# Patient Record
Sex: Female | Born: 1980 | Race: Black or African American | Hispanic: No | Marital: Single | Smoking: Never smoker
Health system: Southern US, Community
[De-identification: ages and names within clinical notes are randomized; demographics above are authoritative.]

## PROBLEM LIST (undated history)

## (undated) ENCOUNTER — Inpatient Hospital Stay (HOSPITAL_COMMUNITY): Payer: Self-pay

## (undated) DIAGNOSIS — I319 Disease of pericardium, unspecified: Secondary | ICD-10-CM

## (undated) DIAGNOSIS — D34 Benign neoplasm of thyroid gland: Secondary | ICD-10-CM

## (undated) DIAGNOSIS — D219 Benign neoplasm of connective and other soft tissue, unspecified: Secondary | ICD-10-CM

---

## 2008-01-09 ENCOUNTER — Ambulatory Visit: Payer: Self-pay | Admitting: Obstetrics & Gynecology

## 2008-01-09 ENCOUNTER — Inpatient Hospital Stay (HOSPITAL_COMMUNITY): Admission: AD | Admit: 2008-01-09 | Discharge: 2008-01-10 | Payer: Self-pay | Admitting: Obstetrics & Gynecology

## 2009-02-12 DIAGNOSIS — I319 Disease of pericardium, unspecified: Secondary | ICD-10-CM

## 2009-02-12 HISTORY — DX: Disease of pericardium, unspecified: I31.9

## 2011-10-07 ENCOUNTER — Emergency Department (HOSPITAL_COMMUNITY): Payer: Self-pay

## 2011-10-07 ENCOUNTER — Emergency Department (HOSPITAL_COMMUNITY)
Admission: EM | Admit: 2011-10-07 | Discharge: 2011-10-07 | Disposition: A | Discharge status: Home / Self Care | Payer: Self-pay | Attending: Emergency Medicine | Admitting: Emergency Medicine

## 2011-10-07 ENCOUNTER — Encounter (HOSPITAL_COMMUNITY): Payer: Self-pay | Admitting: *Deleted

## 2011-10-07 DIAGNOSIS — R0789 Other chest pain: Secondary | ICD-10-CM

## 2011-10-07 DIAGNOSIS — R0602 Shortness of breath: Secondary | ICD-10-CM | POA: Insufficient documentation

## 2011-10-07 DIAGNOSIS — R071 Chest pain on breathing: Secondary | ICD-10-CM | POA: Insufficient documentation

## 2011-10-07 HISTORY — DX: Disease of pericardium, unspecified: I31.9

## 2011-10-07 LAB — BASIC METABOLIC PANEL
Chloride: 100 mEq/L (ref 96–112)
GFR calc Af Amer: >90 mL/min (ref >90)
GFR calc non Af Amer: >90 mL/min (ref >90)
Glucose, Bld: 85 mg/dL (ref 70–99)
Potassium: 4.2 mEq/L (ref 3.5–5.1)
Sodium: 136 mEq/L (ref 135–145)

## 2011-10-07 MED ORDER — KETOROLAC TROMETHAMINE 60 MG/2ML IM SOLN
60.0000 mg | Freq: Once | INTRAMUSCULAR | Status: AC
  Administered 2011-10-07: 60 mg via INTRAMUSCULAR
  Filled 2011-10-07: qty 2

## 2011-10-07 MED ORDER — IBUPROFEN 800 MG PO TABS: 800.0000 mg | ORAL_TABLET | Freq: Three times a day (TID) | ORAL | Status: AC

## 2011-10-07 NOTE — ED Notes (Signed)
To ED for eval of cp and sob. Started yesterday. Denies injury. Pt states even her bra on makes her hurt. Described as a 'sharp pressure'. States she was dx with pericarditis in 2011 while in [place]. States this feels the same 'but worse because coughing or blowing my nose makes it hurt worse'. Pain is located on left side of chest and feels pain down both arms.

## 2011-10-07 NOTE — ED Notes (Signed)
Pt refuses to get back in chair and demanded to stay on the floor

## 2011-10-07 NOTE — ED Notes (Signed)
Pt is on the floor, in tears due to pain. Pt advised to sit in chair

## 2011-10-07 NOTE — ED Notes (Addendum)
Assisted pt in taking off bra due to pain. Pt states she has been told (in [place]) that she has a knot in her chest. States she feels the knot is bigger. When looking at chest left side appears swollen without crepitus noted

## 2011-10-07 NOTE — ED Provider Notes (Signed)
History     CSN: [id number]  Arrival date & time 10/07/11  1222   First MD Initiated Contact with Patient 10/07/11 1323      Chief Complaint  Patient presents with  . Chest Pain  . Shortness of Breath    (Consider location/radiation/quality/duration/timing/severity/associated sxs/prior treatment) Patient is a 31 y.o. female presenting with chest pain and shortness of breath. The history is provided by the patient.  Chest Pain The chest pain began yesterday. Chest pain occurs constantly. The chest pain is unchanged. Chest pain is worsened by certain positions and deep breathing. Primary symptoms include shortness of breath. Pertinent negatives for primary symptoms include no fever, no abdominal pain, no nausea and no vomiting. Associated symptoms comments: She complains of chest pain on left since yesterday that is constant, worse with movement deep breath or cough. No N, V. She reports history of pericarditis and this feels the same. No fever. .    Shortness of Breath  Associated symptoms include chest pain and shortness of breath. Pertinent negatives include no fever.    Past Medical History  Diagnosis Date  . Pericarditis     History reviewed. No pertinent past surgical history.  No family history on file.  History  Substance Use Topics  . Smoking status: Never Smoker   . Smokeless tobacco: Not on file  . Alcohol Use: No    OB History    Grav Para Term Preterm Abortions TAB SAB Ect Mult Living                  Review of Systems  Constitutional: Negative for fever.  HENT: Negative for congestion.   Respiratory: Positive for shortness of breath.   Cardiovascular: Positive for chest pain. Negative for leg swelling.  Gastrointestinal: Negative for nausea, vomiting and abdominal pain.  Musculoskeletal: Negative for back pain.    Allergies  Penicillins  Home Medications  No current outpatient prescriptions on file.  BP 115/72  Pulse 63  Temp 98.5 F (36.9  C) (Oral)  Resp 14  SpO2 100%  LMP 10/05/2011  Physical Exam  Constitutional: She is oriented to person, place, and time. She appears well-developed and well-nourished.  HENT:  Head: Normocephalic.  Mouth/Throat: Oropharynx is clear and moist.  Neck: Normal range of motion. Neck supple.  Cardiovascular: Normal rate and regular rhythm.   Pulmonary/Chest: Effort normal and breath sounds normal. She has no wheezes. She has no rales. She exhibits tenderness.       Left chest wall tenderness.   Abdominal: Soft. Bowel sounds are normal. There is no tenderness. There is no rebound and no guarding.  Musculoskeletal: Normal range of motion. She exhibits no edema.  Neurological: She is alert and oriented to person, place, and time.  Skin: Skin is warm and dry. No rash noted.  Psychiatric: She has a normal mood and affect.    ED Course  Procedures (including critical care time)   Labs Reviewed  BASIC METABOLIC PANEL   Dg Chest 2 View  10/07/2011  *RADIOLOGY REPORT*  Clinical Data: Shortness of breath and chest pain.  CHEST - 2 VIEW  Comparison: None  Findings: The cardiac silhouette, mediastinal and hilar contours are within normal limits.  The lungs are clear.  No pleural effusion.  The bony thorax is intact.  IMPRESSION: No acute cardiopulmonary findings.   Original Report Authenticated By: P. Loralie Champagne, M.D.      No diagnosis found.  1. Chest Wall pain   MDM  Date: 10/07/2011  Rate: 51  Rhythm: normal sinus rhythm  QRS Axis: normal  Intervals: normal  ST/T Wave abnormalities: normal  Conduction Disutrbances:none  Narrative Interpretation:   Old EKG Reviewed: none available  Labs, EKG and CXR negative. Patient is ambulating to and from bathroom without difficulty or apparent discomfort. No tachycardia or hypoxia to suggest PE. Afebrile, does not favor infection. Chest pain is reproducible. Presentation supports uncomplicated chest wall pain.  VSS.          Rodena Medin, PA-C 10/07/11 1523

## 2011-10-08 NOTE — ED Provider Notes (Signed)
Medical screening examination/treatment/procedure(s) were conducted as a shared visit with non-physician practitioner(s) and myself.  I personally evaluated the patient during the encounter Pt with ant chest wall pain, marked tenderness on exam reproducing symptoms. Cxr.   Suzi Roots, MD 10/08/11 [phone]

## 2012-01-12 ENCOUNTER — Inpatient Hospital Stay (HOSPITAL_COMMUNITY)
Admission: AD | Admit: 2012-01-12 | Discharge: 2012-01-12 | Disposition: A | Discharge status: Home / Self Care | Payer: Medicaid Other | Source: Ambulatory Visit | Attending: Obstetrics and Gynecology | Admitting: Obstetrics and Gynecology

## 2012-01-12 ENCOUNTER — Encounter (HOSPITAL_COMMUNITY): Payer: Self-pay | Admitting: *Deleted

## 2012-01-12 DIAGNOSIS — K5900 Constipation, unspecified: Secondary | ICD-10-CM | POA: Insufficient documentation

## 2012-01-12 DIAGNOSIS — N949 Unspecified condition associated with female genital organs and menstrual cycle: Secondary | ICD-10-CM

## 2012-01-12 DIAGNOSIS — R109 Unspecified abdominal pain: Secondary | ICD-10-CM | POA: Insufficient documentation

## 2012-01-12 DIAGNOSIS — O99891 Other specified diseases and conditions complicating pregnancy: Secondary | ICD-10-CM | POA: Insufficient documentation

## 2012-01-12 DIAGNOSIS — O9989 Other specified diseases and conditions complicating pregnancy, childbirth and the puerperium: Secondary | ICD-10-CM

## 2012-01-12 DIAGNOSIS — R102 Pelvic and perineal pain: Secondary | ICD-10-CM

## 2012-01-12 LAB — URINALYSIS, ROUTINE W REFLEX MICROSCOPIC
Nitrite: NEGATIVE
Specific Gravity, Urine: 1.025 (ref 1.005–1.030)
Urobilinogen, UA: 0.2 mg/dL (ref 0.0–1.0)
pH: 6 (ref 5.0–8.0)

## 2012-01-12 LAB — POCT PREGNANCY, URINE: Preg Test, Ur: POSITIVE — AB

## 2012-01-12 MED ORDER — DOCUSATE SODIUM 100 MG PO CAPS: 100.0000 mg | ORAL_CAPSULE | Freq: Two times a day (BID) | ORAL | Status: DC

## 2012-01-12 NOTE — MAU Note (Signed)
Pt was seen at [facility] in the ER about 6 weeks ago. Pt signed the release of medical form and it was faxed to [facility] at [facility] ER. [facility].

## 2012-01-12 NOTE — MAU Provider Note (Signed)
History     CSN: [id number]  Arrival date and time: 01/12/12 1324   First Provider Initiated Contact with Patient 01/12/12 1423      Chief Complaint  Patient presents with  . Abdominal Pain   HPI  Jennifer Dunlap is a 31 y.o. [id number] at [redacted]w[redacted]d who presents today with bilateral lower abdominal pain that is sharp. It is in the same place as round ligament pain, but feels much stronger than what she has experienced with her other pregnancies. It started about 2 weeks ago, and is described as sharp. She has not found any alleviating or aggravating factors. 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  She has also started getting headaches within the last week, and her right has been twitching . She has 5 kids, and works.   Past Medical History  Diagnosis Date  . Pericarditis     Past Surgical History  Procedure Date  . No past surgeries     History reviewed. No pertinent family history.  History  Substance Use Topics  . Smoking status: Never Smoker   . Smokeless tobacco: Not on file  . Alcohol Use: No    Allergies:  Allergies  Allergen Reactions  . Penicillins Shortness Of Breath    No prescriptions prior to admission    Review of Systems  Constitutional: Negative for fever and chills.  Gastrointestinal: Positive for abdominal pain and constipation (taking pre-natal vitamin. Last BM was today, but small, hard, formed ballls. She has tried increasing fiber and water. ). Negative for nausea, vomiting and diarrhea.  Neurological: Positive for headaches.   Physical Exam   Blood pressure 120/66, pulse 66, temperature 97.6 F (36.4 C), temperature source Oral, resp. rate 18, height 5\' 3"  (1.6 m), weight 161 lb 12.8 oz (73.392 kg), last menstrual period 10/07/2011.  Physical Exam  Nursing note and vitals reviewed. Constitutional: She is oriented to person, place, and time. She appears well-developed and well-nourished.    Cardiovascular: Normal rate.   Respiratory: Effort normal.  GI: Soft. She exhibits no distension. There is no tenderness. There is no rebound and no guarding.  Genitourinary:        External: normal Vagina: normal, small amount of white discharge Cervix: pink, smooth. Closed Uterus: AGA Adnexa: NT Tenderness along the round ligaments.   Musculoskeletal: Normal range of motion.  Neurological: She is alert and oriented to person, place, and time.  Skin: Skin is warm and dry.    MAU Course  Procedures  Results for orders placed during the hospital encounter of 01/12/12 (from the past 24 hour(s))  URINALYSIS, ROUTINE W REFLEX MICROSCOPIC     Status: Abnormal   Collection Time   01/12/12  1:30 PM      Component Value Range   Color, Urine YELLOW  YELLOW   APPearance HAZY (*) CLEAR   Specific Gravity, Urine 1.025  1.005 - 1.030   pH 6.0  5.0 - 8.0   Glucose, UA NEGATIVE  NEGATIVE mg/dL   Hgb urine dipstick NEGATIVE  NEGATIVE   Bilirubin Urine NEGATIVE  NEGATIVE   Ketones, ur NEGATIVE  NEGATIVE mg/dL   Protein, ur NEGATIVE  NEGATIVE mg/dL   Urobilinogen, UA 0.2  0.0 - 1.0 mg/dL   Nitrite NEGATIVE  NEGATIVE   Leukocytes, UA NEGATIVE  NEGATIVE  POCT PREGNANCY, URINE     Status: Abnormal   Collection Time   01/12/12  2:04 PM  Component Value Range   Preg Test, Ur POSITIVE (*) NEGATIVE  WET PREP, GENITAL     Status: Abnormal   Collection Time   01/12/12  2:37 PM      Component Value Range   Yeast Wet Prep HPF POC NONE SEEN  NONE SEEN   Trich, Wet Prep NONE SEEN  NONE SEEN   Clue Cells Wet Prep HPF POC NONE SEEN  NONE SEEN   WBC, Wet Prep HPF POC FEW (*) NONE SEEN    Assessment and Plan  Round ligament pain Constipation  Colace 100mg  BID Comfort measures reviewed 2nd trimester danger signs reviewed Start prenatal care ASAP.  Tawnya Crook 01/12/2012, 2:23 PM

## 2012-01-12 NOTE — MAU Note (Signed)
Pt reports having a sharp pain that comes and goes for a few weeks, Not started prenatal care yet.

## 2012-01-13 LAB — GC/CHLAMYDIA PROBE AMP
CT Probe RNA: NEGATIVE
GC Probe RNA: NEGATIVE

## 2012-01-14 NOTE — MAU Provider Note (Signed)
Attestation of Attending Supervision of Advanced Practitioner (CNM/NP): Evaluation and management procedures were performed by the Advanced Practitioner under my supervision and collaboration.  I have reviewed the Advanced Practitioner's note and chart, and I agree with the management and plan.  Jonty Morrical 01/14/2012 12:35 PM   

## 2012-02-13 NOTE — L&D Delivery Note (Signed)
Delivery Note At 8:14 AM a viable female was delivered via Vaginal, Spontaneous Delivery (Presentation: Left  Anterior).  APGAR: 7, 9; weight 8 lb 1.1 oz (3660 g).   Placenta status: Intact, Spontaneous.  Cord: 3 vessels with the following complications: None.    Anesthesia: Epidural  Episiotomy: None Lacerations: None Suture Repair: n/a Est. Blood Loss (mL): 250  Mom to postpartum.  Baby to nursery-stable.  POE,DEIRDRE 07/15/2012, 10:31 AM   Note done for Dr. Thad Ranger who attended delivery with Dr. Jerelyn Scott.

## 2012-02-13 NOTE — L&D Delivery Note (Signed)
Agree with above note.  Jennifer Dunlap H. 07/23/2012 4:10 PM

## 2012-02-20 ENCOUNTER — Other Ambulatory Visit (HOSPITAL_COMMUNITY)
Admission: RE | Admit: 2012-02-20 | Discharge: 2012-02-20 | Disposition: A | Discharge status: Home / Self Care | Payer: Medicaid Other | Source: Ambulatory Visit | Attending: Obstetrics and Gynecology | Admitting: Obstetrics and Gynecology

## 2012-02-20 ENCOUNTER — Ambulatory Visit (INDEPENDENT_AMBULATORY_CARE_PROVIDER_SITE_OTHER): Payer: Self-pay | Admitting: Family Medicine

## 2012-02-20 ENCOUNTER — Ambulatory Visit (HOSPITAL_COMMUNITY)
Admission: RE | Admit: 2012-02-20 | Discharge: 2012-02-20 | Disposition: A | Discharge status: Home / Self Care | Payer: Medicaid Other | Source: Ambulatory Visit | Attending: Family Medicine | Admitting: Family Medicine

## 2012-02-20 ENCOUNTER — Encounter: Payer: Self-pay | Admitting: Family Medicine

## 2012-02-20 VITALS — BP 111/63 | Temp 97.4°F | Wt 164.0 lb

## 2012-02-20 DIAGNOSIS — Z1151 Encounter for screening for human papillomavirus (HPV): Secondary | ICD-10-CM | POA: Insufficient documentation

## 2012-02-20 DIAGNOSIS — O0930 Supervision of pregnancy with insufficient antenatal care, unspecified trimester: Secondary | ICD-10-CM

## 2012-02-20 DIAGNOSIS — Z8759 Personal history of other complications of pregnancy, childbirth and the puerperium: Secondary | ICD-10-CM

## 2012-02-20 DIAGNOSIS — Z8679 Personal history of other diseases of the circulatory system: Secondary | ICD-10-CM

## 2012-02-20 DIAGNOSIS — O0940 Supervision of pregnancy with grand multiparity, unspecified trimester: Secondary | ICD-10-CM | POA: Insufficient documentation

## 2012-02-20 DIAGNOSIS — Z3689 Encounter for other specified antenatal screening: Secondary | ICD-10-CM | POA: Insufficient documentation

## 2012-02-20 DIAGNOSIS — Z01419 Encounter for gynecological examination (general) (routine) without abnormal findings: Secondary | ICD-10-CM | POA: Insufficient documentation

## 2012-02-20 LAB — POCT URINALYSIS DIP (DEVICE)
Bilirubin Urine: NEGATIVE
Ketones, ur: NEGATIVE mg/dL
Leukocytes, UA: NEGATIVE
Nitrite: NEGATIVE
Protein, ur: NEGATIVE mg/dL
Specific Gravity, Urine: 1.015 (ref 1.005–1.030)
Urobilinogen, UA: 0.2 mg/dL (ref 0.0–1.0)

## 2012-02-20 NOTE — Progress Notes (Signed)
Pulse: 72

## 2012-02-20 NOTE — Patient Instructions (Addendum)
Pregnancy - Second Trimester The second trimester of pregnancy (3 to 6 months) is a period of rapid growth for you and your baby. At the end of the sixth month, your baby is about 9 inches long and weighs 1 1/2 pounds. You will begin to feel the baby move between 18 and 20 weeks of the pregnancy. This is called quickening. Weight gain is faster. A clear fluid (colostrum) may leak out of your breasts. You may feel small contractions of the womb (uterus). This is known as false labor or Braxton-Hicks contractions. This is like a practice for labor when the baby is ready to be born. Usually, the problems with morning sickness have usually passed by the end of your first trimester. Some women develop small dark blotches (called cholasma, mask of pregnancy) on their face that usually goes away after the baby is born. Exposure to the sun makes the blotches worse. Acne may also develop in some pregnant women and pregnant women who have acne, may find that it goes away. PRENATAL EXAMS  Blood work may continue to be done during prenatal exams. These tests are done to check on your health and the probable health of your baby. Blood work is used to follow your blood levels (hemoglobin). Anemia (low hemoglobin) is common during pregnancy. Iron and vitamins are given to help prevent this. You will also be checked for diabetes between 24 and 28 weeks of the pregnancy. Some of the previous blood tests may be repeated.  The size of the uterus is measured during each visit. This is to make sure that the baby is continuing to grow properly according to the dates of the pregnancy.  Your blood pressure is checked every prenatal visit. This is to make sure you are not getting toxemia.  Your urine is checked to make sure you do not have an infection, diabetes or protein in the urine.  Your weight is checked often to make sure gains are happening at the suggested rate. This is to ensure that both you and your baby are growing  normally.  Sometimes, an ultrasound is performed to confirm the proper growth and development of the baby. This is a test which bounces harmless sound waves off the baby so your caregiver can more accurately determine due dates. Sometimes, a specialized test is done on the amniotic fluid surrounding the baby. This test is called an amniocentesis. The amniotic fluid is obtained by sticking a needle into the belly (abdomen). This is done to check the chromosomes in instances where there is a concern about possible genetic problems with the baby. It is also sometimes done near the end of pregnancy if an early delivery is required. In this case, it is done to help make sure the baby's lungs are mature enough for the baby to live outside of the womb. CHANGES OCCURING IN THE SECOND TRIMESTER OF PREGNANCY Your body goes through many changes during pregnancy. They vary from person to person. Talk to your caregiver about changes you notice that you are concerned about.  During the second trimester, you will likely have an increase in your appetite. It is normal to have cravings for certain foods. This varies from person to person and pregnancy to pregnancy.  Your lower abdomen will begin to bulge.  You may have to urinate more often because the uterus and baby are pressing on your bladder. It is also common to get more bladder infections during pregnancy (pain with urination). You can help this by   drinking lots of fluids and emptying your bladder before and after intercourse.  You may begin to get stretch marks on your hips, abdomen, and breasts. These are normal changes in the body during pregnancy. There are no exercises or medications to take that prevent this change.  You may begin to develop swollen and bulging veins (varicose veins) in your legs. Wearing support hose, elevating your feet for 15 minutes, 3 to 4 times a day and limiting salt in your diet helps lessen the problem.  Heartburn may develop  as the uterus grows and pushes up against the stomach. Antacids recommended by your caregiver helps with this problem. Also, eating smaller meals 4 to 5 times a day helps.  Constipation can be treated with a stool softener or adding bulk to your diet. Drinking lots of fluids, vegetables, fruits, and whole grains are helpful.  Exercising is also helpful. If you have been very active up until your pregnancy, most of these activities can be continued during your pregnancy. If you have been less active, it is helpful to start an exercise program such as walking.  Hemorrhoids (varicose veins in the rectum) may develop at the end of the second trimester. Warm sitz baths and hemorrhoid cream recommended by your caregiver helps hemorrhoid problems.  Backaches may develop during this time of your pregnancy. Avoid heavy lifting, wear low heal shoes and practice good posture to help with backache problems.  Some pregnant women develop tingling and numbness of their hand and fingers because of swelling and tightening of ligaments in the wrist (carpel tunnel syndrome). This goes away after the baby is born.  As your breasts enlarge, you may have to get a bigger bra. Get a comfortable, cotton, support bra. Do not get a nursing bra until the last month of the pregnancy if you will be nursing the baby.  You may get a dark line from your belly button to the pubic area called the linea nigra.  You may develop rosy cheeks because of increase blood flow to the face.  You may develop spider looking lines of the face, neck, arms and chest. These go away after the baby is born. HOME CARE INSTRUCTIONS   It is extremely important to avoid all smoking, herbs, alcohol, and unprescribed drugs during your pregnancy. These chemicals affect the formation and growth of the baby. Avoid these chemicals throughout the pregnancy to ensure the delivery of a healthy infant.  Most of your home care instructions are the same as  suggested for the first trimester of your pregnancy. Keep your caregiver's appointments. Follow your caregiver's instructions regarding medication use, exercise and diet.  During pregnancy, you are providing food for you and your baby. Continue to eat regular, well-balanced meals. Choose foods such as meat, fish, milk and other low fat dairy products, vegetables, fruits, and whole-grain breads and cereals. Your caregiver will tell you of the ideal weight gain.  A physical sexual relationship may be continued up until near the end of pregnancy if there are no other problems. Problems could include early (premature) leaking of amniotic fluid from the membranes, vaginal bleeding, abdominal pain, or other medical or pregnancy problems.  Exercise regularly if there are no restrictions. Check with your caregiver if you are unsure of the safety of some of your exercises. The greatest weight gain will occur in the last 2 trimesters of pregnancy. Exercise will help you:  Control your weight.  Get you in shape for labor and delivery.  Lose weight   after you have the baby.  Wear a good support or jogging bra for breast tenderness during pregnancy. This may help if worn during sleep. Pads or tissues may be used in the bra if you are leaking colostrum.  Do not use hot tubs, steam rooms or saunas throughout the pregnancy.  Wear your seat belt at all times when driving. This protects you and your baby if you are in an accident.  Avoid raw meat, uncooked cheese, cat litter boxes and soil used by cats. These carry germs that can cause birth defects in the baby.  The second trimester is also a good time to visit your dentist for your dental health if this has not been done yet. Getting your teeth cleaned is OK. Use a soft toothbrush. Brush gently during pregnancy.  It is easier to loose urine during pregnancy. Tightening up and strengthening the pelvic muscles will help with this problem. Practice stopping your  urination while you are going to the bathroom. These are the same muscles you need to strengthen. It is also the muscles you would use as if you were trying to stop from passing gas. You can practice tightening these muscles up 10 times a set and repeating this about 3 times per day. Once you know what muscles to tighten up, do not perform these exercises during urination. It is more likely to contribute to an infection by backing up the urine.  Ask for help if you have financial, counseling or nutritional needs during pregnancy. Your caregiver will be able to offer counseling for these needs as well as refer you for other special needs.  Your skin may become oily. If so, wash your face with mild soap, use non-greasy moisturizer and oil or cream based makeup. MEDICATIONS AND DRUG USE IN PREGNANCY  Take prenatal vitamins as directed. The vitamin should contain 1 milligram of folic acid. Keep all vitamins out of reach of children. Only a couple vitamins or tablets containing iron may be fatal to a baby or young child when ingested.  Avoid use of all medications, including herbs, over-the-counter medications, not prescribed or suggested by your caregiver. Only take over-the-counter or prescription medicines for pain, discomfort, or fever as directed by your caregiver. Do not use aspirin.  Let your caregiver also know about herbs you may be using.  Alcohol is related to a number of birth defects. This includes fetal alcohol syndrome. All alcohol, in any form, should be avoided completely. Smoking will cause low birth rate and premature babies.  Street or illegal drugs are very harmful to the baby. They are absolutely forbidden. A baby born to an addicted mother will be addicted at birth. The baby will go through the same withdrawal an adult does. SEEK MEDICAL CARE IF:  You have any concerns or worries during your pregnancy. It is better to call with your questions if you feel they cannot wait, rather  than worry about them. SEEK IMMEDIATE MEDICAL CARE IF:   An unexplained oral temperature above 102 F (38.9 C) develops, or as your caregiver suggests.  You have leaking of fluid from the vagina (birth canal). If leaking membranes are suspected, take your temperature and tell your caregiver of this when you call.  There is vaginal spotting, bleeding, or passing clots. Tell your caregiver of the amount and how many pads are used. Light spotting in pregnancy is common, especially following intercourse.  You develop a bad smelling vaginal discharge with a change in the color from clear   to white.  You continue to feel sick to your stomach (nauseated) and have no relief from remedies suggested. You vomit blood or coffee ground-like materials.  You lose more than 2 pounds of weight or gain more than 2 pounds of weight over 1 week, or as suggested by your caregiver.  You notice swelling of your face, hands, feet, or legs.  You get exposed to German measles and have never had them.  You are exposed to fifth disease or chickenpox.  You develop belly (abdominal) pain. Round ligament discomfort is a common non-cancerous (benign) cause of abdominal pain in pregnancy. Your caregiver still must evaluate you.  You develop a bad headache that does not go away.  You develop fever, diarrhea, pain with urination, or shortness of breath.  You develop visual problems, blurry, or double vision.  You fall or are in a car accident or any kind of trauma.  There is mental or physical violence at home. Document Released: 01/23/2001 Document Revised: 04/23/2011 Document Reviewed: 07/28/2008 [employer] Patient Information 2013 [employer]  AFP Maternal This is a routine screen (tests) used to check for fetal abnormalities such as Down syndrome and neural tube defects. Down Syndrome is a chromosomal abnormality, sometimes called Trisomy 21. Neural tube defects are serious birth defects. The brain, spinal  cord, or their coverings do not develop completely. Women should be tested in the 15th to 20th week of pregnancy. The msAFP screen involves three or four tests that measure substances found in the blood that make the testing better. During development, AFP levels in fetal blood and amniotic fluid rise until about 12 weeks. The levels then gradually fall until birth. AFP is a protein produce by fetal tissue. AFP crosses the placenta and appears in the maternal blood. A baby with an open neural tube defect has an opening in its spine, head, or abdominal wall that allows higher-than-usual amounts of AFP to pass into the mother's blood. If a screen is positive, more tests are needed to make a diagnosis. These include ultrasound and perhaps amniocentesis (checking the fluid that surrounds the baby). These tests are used to help women and their caregivers make decisions about the management of their pregnancies. In pregnancies where the fetus is carrying the chromosomal defect that results in Down syndrome, the levels of AFP and unconjugated estriol tend to be low and hCG and inhibin A levels high.  PREPARATION FOR TEST Blood is drawn from a vein in your arm usually between the 15th and 20th weeks of pregnancy. Four different tests on your blood are done. These are AFP, hCG, unconjugated estriol, and inhibin A. The combination of tests produces a more accurate result. NORMAL FINDINGS   Adult: less than 40ng/mL or less than 40 mg/L (SI units)  Child younger than1 year: less than 30 ng/mL Ranges are stratified by weeks of gestation and vary among laboratories. Ranges for normal findings may vary among different laboratories and hospitals. You should always check with your doctor after having lab work or other tests done to discuss the meaning of your test results and whether your values are considered within normal limits. MEANING OF TEST  These are screening tests. Not all fetal abnormalities will give  positive test results. Of all women who have positive AFP screening results, only a very small number of them have babies who actually have a neural tube defect or chromosomal abnormality. Your caregiver will go over the test results with you and discuss the importance and meaning of   your results, as well as treatment options and the need for additional tests if necessary. OBTAINING THE TEST RESULTS It is your responsibility to obtain your test results. Ask the lab or department performing the test when and how you will get your results. Document Released: 02/21/2004 Document Revised: 04/23/2011 Document Reviewed: 01/03/2008 [employer] Patient Information 2013 [employer].   

## 2012-02-20 NOTE — Progress Notes (Signed)
Subjective:    Jennifer Dunlap is being seen today for her first obstetrical visit.  This is not a planned pregnancy. She is at [redacted]w[redacted]d gestation. Her obstetrical history is significant for grand multiparity. Relationship with FOB: significant other, living together. Patient does intend to breast feed. Pregnancy history fully reviewed.  Pregnancy history reviewed. All vaginal deliveries, no complications. Patient moved here from [place] and has not been able to establish Medicaid yet but application in process. Has two older children (10 and 13) from prior relationship. Living with current FOB who is also father of her last three children, ages 76, 65, and 2 years. These are all girls, and she is hoping for a boy. If this baby is a boy, she wants a tubal ligation. Otherwise she may consider other forms of birth control.   Patient is having headaches several times a week relieved by tylenol. This is a new problem with pregnancy. No history of hypertension. No visual disturbances. 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  Pericarditis with last pregnancy. Was supposed to follow up but never did. Denies chest pain, palpitations or shortness of breath.   Menstrual History:  No abnormal paps but does not remember when last her last pap was.  OB History    Grav Para Term Preterm Abortions TAB SAB Ect Mult Living   7 5 5  0 1 1 0 0 0 5      Patient's last menstrual period was 10/07/2011.   Last preg, had pericarditis no known disease. Was supposed to see cardiologist but moved down here. No SOB.  Last pap unkown. Never had pap before. No discharge or bleeding.   PMH:  Pericarditis with last pregnancy.  PSH:  Wisdom teeth  MEDS:  Tylenol occasionally, Prenatal vitamins, occasional colace ALL:  PCN - stopped breathing as baby. She thinks she has taken amoxcillin without problems.  The following portions of the patient's history were reviewed and updated as  appropriate: allergies, current medications, past family history, past medical history, past social history, past surgical history and problem list.  Review of Systems Pertinent items are noted in HPI.    Objective:    General appearance: alert, cooperative and no distress Eyes: conjunctivae/corneas clear. EOM's intact.  Neck: supple, symmetrical, trachea midline Lungs: clear to auscultation bilaterally Heart:  RRR, no murmurs Abdomen: normal bowel sounds, non-tender, gravid (size appr for dates) Pelvic: cervix normal in appearance, external genitalia normal, no adnexal masses or tenderness, no cervical motion tenderness and vagina normal without discharge Skin: Skin color, texture, turgor normal. No rashes or lesions Neurologic: Grossly normal    Assessment:    Pregnancy at 19 and 3/7 weeks  History pericarditis with no follow up Late prenatal care   Plan:    Initial labs drawn. Prenatal vitamins. Problem list reviewed and updated. AFP3 discussed: requested. Ordered. Role of ultrasound in pregnancy discussed; fetal survey: requested. Ordered. Amniocentesis discussed: not indicated. Follow up in 4 weeks. Pericarditis history:  Will refer to cardiology. 50% of 45 min visit spent on counseling and coordination of care.

## 2012-02-21 LAB — OBSTETRIC PANEL
Basophils Absolute: 0 10*3/uL (ref 0.0–0.1)
Hepatitis B Surface Ag: NEGATIVE
Lymphocytes Relative: 24 % (ref 12–46)
Lymphs Abs: 2.1 10*3/uL (ref 0.7–4.0)
Neutrophils Relative %: 71 % (ref 43–77)
Platelets: 207 10*3/uL (ref 150–400)
RBC: 4.18 MIL/uL (ref 3.87–5.11)
RDW: 14.6 % (ref 11.5–15.5)
Rubella: 7.65 Index — ABNORMAL HIGH (ref <0.90)
WBC: 8.7 10*3/uL (ref 4.0–10.5)

## 2012-03-03 ENCOUNTER — Encounter: Payer: Self-pay | Admitting: *Deleted

## 2012-03-05 ENCOUNTER — Encounter: Payer: Self-pay | Admitting: Family Medicine

## 2012-03-18 ENCOUNTER — Encounter: Payer: Self-pay | Admitting: Obstetrics & Gynecology

## 2012-03-19 ENCOUNTER — Ambulatory Visit (INDEPENDENT_AMBULATORY_CARE_PROVIDER_SITE_OTHER): Payer: Self-pay | Admitting: Advanced Practice Midwife

## 2012-03-19 VITALS — BP 111/72 | Wt 169.7 lb

## 2012-03-19 DIAGNOSIS — Z8679 Personal history of other diseases of the circulatory system: Secondary | ICD-10-CM

## 2012-03-19 DIAGNOSIS — O0930 Supervision of pregnancy with insufficient antenatal care, unspecified trimester: Secondary | ICD-10-CM

## 2012-03-19 LAB — POCT URINALYSIS DIP (DEVICE)
Glucose, UA: NEGATIVE mg/dL
Hgb urine dipstick: NEGATIVE
Ketones, ur: NEGATIVE mg/dL
Specific Gravity, Urine: 1.025 (ref 1.005–1.030)
Urobilinogen, UA: 1 mg/dL (ref 0.0–1.0)

## 2012-03-19 NOTE — Progress Notes (Signed)
Pulse: 77

## 2012-03-19 NOTE — Patient Instructions (Signed)
Pregnancy - Second Trimester The second trimester of pregnancy (3 to 6 months) is a period of rapid growth for you and your baby. At the end of the sixth month, your baby is about 9 inches long and weighs 1 1/2 pounds. You will begin to feel the baby move between 18 and 20 weeks of the pregnancy. This is called quickening. Weight gain is faster. A clear fluid (colostrum) may leak out of your breasts. You may feel small contractions of the womb (uterus). This is known as false labor or Braxton-Hicks contractions. This is like a practice for labor when the baby is ready to be born. Usually, the problems with morning sickness have usually passed by the end of your first trimester. Some women develop small dark blotches (called cholasma, mask of pregnancy) on their face that usually goes away after the baby is born. Exposure to the sun makes the blotches worse. Acne may also develop in some pregnant women and pregnant women who have acne, may find that it goes away. PRENATAL EXAMS  Blood work may continue to be done during prenatal exams. These tests are done to check on your health and the probable health of your baby. Blood work is used to follow your blood levels (hemoglobin). Anemia (low hemoglobin) is common during pregnancy. Iron and vitamins are given to help prevent this. You will also be checked for diabetes between 24 and 28 weeks of the pregnancy. Some of the previous blood tests may be repeated.  The size of the uterus is measured during each visit. This is to make sure that the baby is continuing to grow properly according to the dates of the pregnancy.  Your blood pressure is checked every prenatal visit. This is to make sure you are not getting toxemia.  Your urine is checked to make sure you do not have an infection, diabetes or protein in the urine.  Your weight is checked often to make sure gains are happening at the suggested rate. This is to ensure that both you and your baby are growing  normally.  Sometimes, an ultrasound is performed to confirm the proper growth and development of the baby. This is a test which bounces harmless sound waves off the baby so your caregiver can more accurately determine due dates. Sometimes, a specialized test is done on the amniotic fluid surrounding the baby. This test is called an amniocentesis. The amniotic fluid is obtained by sticking a needle into the belly (abdomen). This is done to check the chromosomes in instances where there is a concern about possible genetic problems with the baby. It is also sometimes done near the end of pregnancy if an early delivery is required. In this case, it is done to help make sure the baby's lungs are mature enough for the baby to live outside of the womb. CHANGES OCCURING IN THE SECOND TRIMESTER OF PREGNANCY Your body goes through many changes during pregnancy. They vary from person to person. Talk to your caregiver about changes you notice that you are concerned about.  During the second trimester, you will likely have an increase in your appetite. It is normal to have cravings for certain foods. This varies from person to person and pregnancy to pregnancy.  Your lower abdomen will begin to bulge.  You may have to urinate more often because the uterus and baby are pressing on your bladder. It is also common to get more bladder infections during pregnancy (pain with urination). You can help this by   drinking lots of fluids and emptying your bladder before and after intercourse.  You may begin to get stretch marks on your hips, abdomen, and breasts. These are normal changes in the body during pregnancy. There are no exercises or medications to take that prevent this change.  You may begin to develop swollen and bulging veins (varicose veins) in your legs. Wearing support hose, elevating your feet for 15 minutes, 3 to 4 times a day and limiting salt in your diet helps lessen the problem.  Heartburn may develop  as the uterus grows and pushes up against the stomach. Antacids recommended by your caregiver helps with this problem. Also, eating smaller meals 4 to 5 times a day helps.  Constipation can be treated with a stool softener or adding bulk to your diet. Drinking lots of fluids, vegetables, fruits, and whole grains are helpful.  Exercising is also helpful. If you have been very active up until your pregnancy, most of these activities can be continued during your pregnancy. If you have been less active, it is helpful to start an exercise program such as walking.  Hemorrhoids (varicose veins in the rectum) may develop at the end of the second trimester. Warm sitz baths and hemorrhoid cream recommended by your caregiver helps hemorrhoid problems.  Backaches may develop during this time of your pregnancy. Avoid heavy lifting, wear low heal shoes and practice good posture to help with backache problems.  Some pregnant women develop tingling and numbness of their hand and fingers because of swelling and tightening of ligaments in the wrist (carpel tunnel syndrome). This goes away after the baby is born.  As your breasts enlarge, you may have to get a bigger bra. Get a comfortable, cotton, support bra. Do not get a nursing bra until the last month of the pregnancy if you will be nursing the baby.  You may get a dark line from your belly button to the pubic area called the linea nigra.  You may develop rosy cheeks because of increase blood flow to the face.  You may develop spider looking lines of the face, neck, arms and chest. These go away after the baby is born. HOME CARE INSTRUCTIONS   It is extremely important to avoid all smoking, herbs, alcohol, and unprescribed drugs during your pregnancy. These chemicals affect the formation and growth of the baby. Avoid these chemicals throughout the pregnancy to ensure the delivery of a healthy infant.  Most of your home care instructions are the same as  suggested for the first trimester of your pregnancy. Keep your caregiver's appointments. Follow your caregiver's instructions regarding medication use, exercise and diet.  During pregnancy, you are providing food for you and your baby. Continue to eat regular, well-balanced meals. Choose foods such as meat, fish, milk and other low fat dairy products, vegetables, fruits, and whole-grain breads and cereals. Your caregiver will tell you of the ideal weight gain.  A physical sexual relationship may be continued up until near the end of pregnancy if there are no other problems. Problems could include early (premature) leaking of amniotic fluid from the membranes, vaginal bleeding, abdominal pain, or other medical or pregnancy problems.  Exercise regularly if there are no restrictions. Check with your caregiver if you are unsure of the safety of some of your exercises. The greatest weight gain will occur in the last 2 trimesters of pregnancy. Exercise will help you:  Control your weight.  Get you in shape for labor and delivery.  Lose weight   after you have the baby.  Wear a good support or jogging bra for breast tenderness during pregnancy. This may help if worn during sleep. Pads or tissues may be used in the bra if you are leaking colostrum.  Do not use hot tubs, steam rooms or saunas throughout the pregnancy.  Wear your seat belt at all times when driving. This protects you and your baby if you are in an accident.  Avoid raw meat, uncooked cheese, cat litter boxes and soil used by cats. These carry germs that can cause birth defects in the baby.  The second trimester is also a good time to visit your dentist for your dental health if this has not been done yet. Getting your teeth cleaned is OK. Use a soft toothbrush. Brush gently during pregnancy.  It is easier to loose urine during pregnancy. Tightening up and strengthening the pelvic muscles will help with this problem. Practice stopping your  urination while you are going to the bathroom. These are the same muscles you need to strengthen. It is also the muscles you would use as if you were trying to stop from passing gas. You can practice tightening these muscles up 10 times a set and repeating this about 3 times per day. Once you know what muscles to tighten up, do not perform these exercises during urination. It is more likely to contribute to an infection by backing up the urine.  Ask for help if you have financial, counseling or nutritional needs during pregnancy. Your caregiver will be able to offer counseling for these needs as well as refer you for other special needs.  Your skin may become oily. If so, wash your face with mild soap, use non-greasy moisturizer and oil or cream based makeup. MEDICATIONS AND DRUG USE IN PREGNANCY  Take prenatal vitamins as directed. The vitamin should contain 1 milligram of folic acid. Keep all vitamins out of reach of children. Only a couple vitamins or tablets containing iron may be fatal to a baby or young child when ingested.  Avoid use of all medications, including herbs, over-the-counter medications, not prescribed or suggested by your caregiver. Only take over-the-counter or prescription medicines for pain, discomfort, or fever as directed by your caregiver. Do not use aspirin.  Let your caregiver also know about herbs you may be using.  Alcohol is related to a number of birth defects. This includes fetal alcohol syndrome. All alcohol, in any form, should be avoided completely. Smoking will cause low birth rate and premature babies.  Street or illegal drugs are very harmful to the baby. They are absolutely forbidden. A baby born to an addicted mother will be addicted at birth. The baby will go through the same withdrawal an adult does. SEEK MEDICAL CARE IF:  You have any concerns or worries during your pregnancy. It is better to call with your questions if you feel they cannot wait, rather  than worry about them. SEEK IMMEDIATE MEDICAL CARE IF:   An unexplained oral temperature above 102 F (38.9 C) develops, or as your caregiver suggests.  You have leaking of fluid from the vagina (birth canal). If leaking membranes are suspected, take your temperature and tell your caregiver of this when you call.  There is vaginal spotting, bleeding, or passing clots. Tell your caregiver of the amount and how many pads are used. Light spotting in pregnancy is common, especially following intercourse.  You develop a bad smelling vaginal discharge with a change in the color from clear   to white.  You continue to feel sick to your stomach (nauseated) and have no relief from remedies suggested. You vomit blood or coffee ground-like materials.  You lose more than 2 pounds of weight or gain more than 2 pounds of weight over 1 week, or as suggested by your caregiver.  You notice swelling of your face, hands, feet, or legs.  You get exposed to German measles and have never had them.  You are exposed to fifth disease or chickenpox.  You develop belly (abdominal) pain. Round ligament discomfort is a common non-cancerous (benign) cause of abdominal pain in pregnancy. Your caregiver still must evaluate you.  You develop a bad headache that does not go away.  You develop fever, diarrhea, pain with urination, or shortness of breath.  You develop visual problems, blurry, or double vision.  You fall or are in a car accident or any kind of trauma.  There is mental or physical violence at home. Document Released: 01/23/2001 Document Revised: 04/23/2011 Document Reviewed: 07/28/2008 Exit[employer] Patient Information 2013 [employer].  

## 2012-03-19 NOTE — Progress Notes (Signed)
Doing well. Cardiac history reviewed. Had SOB and chest pain about 2 months postpartum in 2011.  Found to have pericarditis (MRI showed inflamed pericardial sac) and had recurrence several months later. States EF was 20-30%. Was told not to get pregnant again. Has done well since. No chest pain, SOB or abnormal swelling now. Does get some feet swelling at end of day, standing at [employer]. Will refer to Cardiology for baseline echo.  Heart today:  Irregularly irregular, S1S2, soft systolic murmur, no Rub or gallop. Minimal ankle swelling. Reviewed signs to report.

## 2012-03-25 ENCOUNTER — Telehealth: Payer: Self-pay | Admitting: Cardiology

## 2012-03-25 ENCOUNTER — Encounter: Payer: Self-pay | Admitting: Cardiology

## 2012-03-25 ENCOUNTER — Ambulatory Visit (INDEPENDENT_AMBULATORY_CARE_PROVIDER_SITE_OTHER): Payer: Self-pay | Admitting: Cardiology

## 2012-03-25 VITALS — BP 100/60 | HR 61 | Ht 63.0 in | Wt 171.0 lb

## 2012-03-25 DIAGNOSIS — E010 Iodine-deficiency related diffuse (endemic) goiter: Secondary | ICD-10-CM | POA: Insufficient documentation

## 2012-03-25 DIAGNOSIS — Z8679 Personal history of other diseases of the circulatory system: Secondary | ICD-10-CM

## 2012-03-25 DIAGNOSIS — E049 Nontoxic goiter, unspecified: Secondary | ICD-10-CM

## 2012-03-25 LAB — TSH: TSH: 0.67 u[IU]/mL (ref 0.35–5.50)

## 2012-03-25 NOTE — Telephone Encounter (Signed)
Vanessa/MR called me From [facility] About the ROI I sent Over, She Stated She Requested Chart From [facility] Pt hasn't Been Seen at their Facility Since 2010 she will Send Records Over  Once she receives Chart. All She has Are Pregnancy Records on this Pt that's All she was Seen their For. I will Staff Message Pam and Make her aware  03/25/12/KM

## 2012-03-25 NOTE — Assessment & Plan Note (Addendum)
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  Seems to be asymptomatic and thyroid is non-tender.  Will check TSH and order thyroid ultrasound.

## 2012-03-25 NOTE — Assessment & Plan Note (Addendum)
Unlikely to have pericarditis at this time given current reported symptoms.  Will get 2d Echo  to be sure.  While her story seem consistent with pericarditis alone it seems odd that she would have bradycardia with her previous episode of pericarditis and does raise some concerns for possible cardiomyopathy.  Will send for records from previous hospitalization [place] to try and further clarify

## 2012-03-25 NOTE — Progress Notes (Signed)
Subjective:    Patient ID: Jennifer Dunlap, female    DOB: 10/25/1980, 32 y.o.   MRN: [id number]  HPI 32 yo [id number] female currently pregnant at [redacted] weeks gestation with pmh of pericarditis referred by [name], CNM for further evaluation.   1. Pericarditis:  History of pericarditis 2 months after delivery of last child in 2011.  Pericarditis was discovered previously because she was having chest pain with shortness of breath and EKG showed marked bradycardia with rate 20-30 at that time.   She was treated with ibuprofen and symptoms resolved.  She did have recurrence of symptoms two months later and was once again treated with ibuprofen with resolution of symptoms.   She states she is currently doing well.   She does endorse occasional lightheadedness and dyspnea but this does not feel like her previous episode of pericarditis.  She does not complain of any other symptoms she had with previous episode of pericarditis including chest pain or tightness.   She remembers having and ECHO done at the hospital in [place] was she was treated previously but does not recall details, she does think that her heart squeeze was fine.    Review of Systems Positive for headache, dizziness, dyspnea, reflux, urinary frequency and wears glasses.  All other systems negative or per HPI.   Past Medical History  Diagnosis Date  . Pericarditis 2011   Family History  Problem Relation Age of Onset  . Heart disease Father   . Heart attack Father   . Cancer Sister 17    ovarian    History   Social History  . Marital Status: Single    Spouse Name: N/A    Number of Children: N/A  . Years of Education: N/A   Social History Main Topics  . Smoking status: Never Smoker   . Smokeless tobacco: Never Used  . Alcohol Use: No  . Drug Use: No  . Sexually Active: Yes   Other Topics Concern  . None   Social History Narrative  . None   Current Outpatient Prescriptions on File Prior to Visit  Medication  Sig Dispense Refill  . acetaminophen (TYLENOL) 500 MG tablet Take 1,000 mg by mouth every 8 (eight) hours as needed. For pain      . docusate sodium (COLACE) 100 MG capsule Take 1 capsule (100 mg total) by mouth 2 (two) times daily.  60 capsule  1  . Prenatal Vit-Fe Fumarate-FA (PRENATAL MULTIVITAMIN) TABS Take 1 tablet by mouth daily.       No current facility-administered medications on file prior to visit.   Allergies  Allergen Reactions  . Penicillins Anaphylaxis    Has not had reaction to Amoxicillin yet.       Objective:   Physical Exam  Constitutional: She appears well-nourished. No distress.  HENT:  Head: Normocephalic and atraumatic.  Neck: No JVD present. Thyromegaly (assymetric with R lobe larger than L) present.  Cardiovascular: Normal rate, regular rhythm and intact distal pulses.  Exam reveals no friction rub.   Murmur (2/6 flow murmur) heard. Pulmonary/Chest: Effort normal and breath sounds normal. No respiratory distress. She has no wheezes. She has no rales.  Abdominal: There is no tenderness.  Gravid   Musculoskeletal: She exhibits no edema.  Neurological: She is alert.      EKG:  NSR with sinus arrythmia.  Rate of 61.  Prior EKG from 09/2011 with sinus bradycardia rate of 51.     Assessment & Plan:  1. Pericarditis: Unlikely to have pericarditis at this time given current reported symptoms.  Will get 2d Echo  to be sure.  While her story seem consistent with pericarditis alone it seems odd that she would have bradycardia with her previous episode of pericarditis and does raise some concerns for possible cardiomyopathy.  Will send for records from previous hospitalization [place] to try and further clarify  2. 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  Seems to be asymptomatic and thyroid is non-tender.  Will check TSH and order thyroid ultrasound.   History and all data above reviewed.  Patient examined.  I agree with the findings as above.  She  is [redacted] weeks pregnant. She does have some dyspnea with exertion. However, she's not having any of the chest discomfort that was her previous pericarditis. We don't have any of her previous records. She's not describing symptoms consistent with a cardiomyopathy. She does mention bradycardia but the circumstances surrounding this are not clear.  The patient exam reveals COR:RRR, no rub  ,  Lungs: Clear  ,  Abd: Positive bowel sounds, no rebound no guarding gravid, Ext Trace edema, Neck:  thyromegally  .  All available labs, radiology testing, previous records reviewed. Agree with documented assessment and plan. She has no symptoms consistent with pericarditis. She's not had a followup echocardiography since this diagnosis  And does have some dyspnea I will check an echocardiogram. Most importantly we need records from [place] to understand her bradycardia. I don't understand in association with her pregnancy as this was 2 months after she delivered. I would not suspect that she is at added risk  For bradycardia arrhythmia with her current pregnancy. Finally she does have thyromegaly we will check a TSH and a thyroid ultrasound.  Fayrene Fearing Hochrein  12:57 PM  03/25/2012

## 2012-03-25 NOTE — Patient Instructions (Addendum)
Thank you for coming in today, it was nice to meet you.  We are going to schedule you for another echocardiogram to look for fluid around the heart.  Your physician has requested that you have an echocardiogram. Echocardiography is a painless test that uses sound waves to create images of your heart. It provides your doctor with information about the size and shape of your heart and how well your heart's chambers and valves are working. This procedure takes approximately one hour. There are no restrictions for this procedure.   We are going to arrange for you to have a thyroid ultrasound and check some lab work (TSH).   Follow up with Dr Antoine Poche after testing has been completed.

## 2012-03-25 NOTE — Telephone Encounter (Signed)
Release x2 Faxed to [facility] @ [phone] [facility] @ [phone] 03/25/12/KM

## 2012-03-31 ENCOUNTER — Telehealth: Payer: Self-pay | Admitting: Cardiology

## 2012-03-31 ENCOUNTER — Ambulatory Visit (HOSPITAL_BASED_OUTPATIENT_CLINIC_OR_DEPARTMENT_OTHER): Payer: Medicaid Other | Admitting: Radiology

## 2012-03-31 ENCOUNTER — Ambulatory Visit (HOSPITAL_COMMUNITY)
Admission: RE | Admit: 2012-03-31 | Discharge: 2012-03-31 | Disposition: A | Discharge status: Home / Self Care | Payer: Medicaid Other | Source: Ambulatory Visit | Attending: Cardiology | Admitting: Cardiology

## 2012-03-31 DIAGNOSIS — R0609 Other forms of dyspnea: Secondary | ICD-10-CM | POA: Insufficient documentation

## 2012-03-31 DIAGNOSIS — R0989 Other specified symptoms and signs involving the circulatory and respiratory systems: Secondary | ICD-10-CM

## 2012-03-31 DIAGNOSIS — E010 Iodine-deficiency related diffuse (endemic) goiter: Secondary | ICD-10-CM

## 2012-03-31 DIAGNOSIS — E042 Nontoxic multinodular goiter: Secondary | ICD-10-CM | POA: Insufficient documentation

## 2012-03-31 DIAGNOSIS — O99891 Other specified diseases and conditions complicating pregnancy: Secondary | ICD-10-CM | POA: Insufficient documentation

## 2012-03-31 DIAGNOSIS — Z8679 Personal history of other diseases of the circulatory system: Secondary | ICD-10-CM

## 2012-03-31 NOTE — Progress Notes (Signed)
Echocardiogram performed.  

## 2012-03-31 NOTE — Telephone Encounter (Signed)
Records Received From [facility] Gave to [place] 03/31/12/KM

## 2012-04-02 ENCOUNTER — Telehealth: Payer: Self-pay | Admitting: Cardiology

## 2012-04-02 NOTE — Telephone Encounter (Signed)
Records Rec From [facility], gave to [place] 04/02/12/KM

## 2012-04-03 ENCOUNTER — Other Ambulatory Visit: Payer: Self-pay | Admitting: Family

## 2012-04-03 ENCOUNTER — Ambulatory Visit (INDEPENDENT_AMBULATORY_CARE_PROVIDER_SITE_OTHER): Payer: Self-pay | Admitting: Family

## 2012-04-03 VITALS — BP 115/74 | Temp 96.7°F | Wt 167.4 lb

## 2012-04-03 DIAGNOSIS — E049 Nontoxic goiter, unspecified: Secondary | ICD-10-CM

## 2012-04-03 DIAGNOSIS — O0930 Supervision of pregnancy with insufficient antenatal care, unspecified trimester: Secondary | ICD-10-CM

## 2012-04-03 DIAGNOSIS — O0940 Supervision of pregnancy with grand multiparity, unspecified trimester: Secondary | ICD-10-CM

## 2012-04-03 DIAGNOSIS — E010 Iodine-deficiency related diffuse (endemic) goiter: Secondary | ICD-10-CM

## 2012-04-03 LAB — POCT URINALYSIS DIP (DEVICE)
Glucose, UA: NEGATIVE mg/dL
Hgb urine dipstick: NEGATIVE
Ketones, ur: NEGATIVE mg/dL
Specific Gravity, Urine: 1.025 (ref 1.005–1.030)
Urobilinogen, UA: 4 mg/dL — ABNORMAL HIGH (ref 0.0–1.0)

## 2012-04-03 NOTE — Progress Notes (Signed)
No questions or concerns; reviewed ultrasound results normal anatomy; thyroid ultrasound showed multiple nodule, none suspicious, recommended rescan in 6-12 months.  1 hr at next visit.  Declined flu vaccine.

## 2012-04-03 NOTE — Progress Notes (Signed)
P=71, c/o trace edema in feet only, c./0 pain in lower back that she has been having for several weeks, Declines flu shot.

## 2012-04-15 ENCOUNTER — Ambulatory Visit: Payer: Self-pay | Admitting: Cardiology

## 2012-04-18 ENCOUNTER — Institutional Professional Consult (permissible substitution): Payer: Self-pay | Admitting: Internal Medicine

## 2012-04-24 ENCOUNTER — Encounter: Payer: Self-pay | Admitting: Obstetrics & Gynecology

## 2012-05-05 ENCOUNTER — Ambulatory Visit (INDEPENDENT_AMBULATORY_CARE_PROVIDER_SITE_OTHER): Payer: Self-pay | Admitting: Cardiology

## 2012-05-05 ENCOUNTER — Encounter: Payer: Self-pay | Admitting: Cardiology

## 2012-05-05 VITALS — BP 118/66 | HR 82 | Ht 63.0 in | Wt 174.8 lb

## 2012-05-05 DIAGNOSIS — E049 Nontoxic goiter, unspecified: Secondary | ICD-10-CM

## 2012-05-05 DIAGNOSIS — E010 Iodine-deficiency related diffuse (endemic) goiter: Secondary | ICD-10-CM

## 2012-05-05 DIAGNOSIS — Z8679 Personal history of other diseases of the circulatory system: Secondary | ICD-10-CM

## 2012-05-05 NOTE — Progress Notes (Signed)
HPI The patient presents for followup. She was referred for a questionable history of previous pericarditis. I didn't have any old records at that time. I did notice a goiter and sent her for an ultrasound. This demonstrated a multinodular goiter. TSH was normal. Followup ultrasound is recommended in several months. She returns now for followup and says that the right side is now larger and she notices this. She is not having any discomfort. Her pregnancy is actually going well. She denies any cardiovascular symptoms. The patient denies any new symptoms such as chest discomfort, neck or arm discomfort. There has been no new shortness of breath, PND or orthopnea. There have been no reported palpitations, presyncope or syncope.    I was able to review old records. I see evidence of a mildly reduced ejection fraction 40% at the lowest in 2011 at [facility].  I don't see evidence of pericarditis though I don't think I have all the records. I did send her for an echo in her EF is now up to 60%. There is no evidence of pericardial effusion. Of note she says she was cautioned about the dangers of being pregnant previously. She used to be on ACE inhibitors and beta blockers but does not recall his medications.  Allergies  Allergen Reactions  . Penicillins Anaphylaxis    Has not had reaction to Amoxicillin yet.    Current Outpatient Prescriptions  Medication Sig Dispense Refill  . acetaminophen (TYLENOL) 500 MG tablet Take 1,000 mg by mouth every 8 (eight) hours as needed. For pain       No current facility-administered medications for this visit.    Past Medical History  Diagnosis Date  . Pericarditis 2011    Past Surgical History  Procedure Laterality Date  . No past surgeries      ROS: As stated in the HPI and negative for all other systems.  PHYSICAL EXAM BP 118/66  Pulse 82  Ht 5\' 3"  (1.6 m)  Wt 174 lb 12.8 oz (79.289 kg)  BMI 30.97 kg/m2  LMP 10/07/2011 GENERAL:   Well appearing HEENT:  Pupils equal round and reactive, fundi not visualized, oral mucosa unremarkable NECK:  No jugular venous distention, waveform within normal limits, carotid upstroke brisk and symmetric, no bruits, obvious thyromegaly right greater than left LYMPHATICS:  No cervical, inguinal adenopathy LUNGS:  Clear to auscultation bilaterally BACK:  No CVA tenderness CHEST:  Unremarkable HEART:  PMI not displaced or sustained,S1 and S2 within normal limits, no S3, no S4, no clicks, no rubs, no murmurs ABD:  Flat, positive bowel sounds normal in frequency in pitch, no bruits, no rebound, no guarding, no midline pulsatile mass, no hepatomegaly, no splenomegaly, gravid EXT:  2 plus pulses throughout, no edema, no cyanosis no clubbing SKIN:  No rashes no nodules NEURO:  Cranial nerves II through XII grossly intact, motor grossly intact throughout PSYCH:  Cognitively intact, oriented to person place and time  ASSESSMENT AND PLAN  GOITER:   She has a multinodular goiter. I was unable to speak with anybody at [facility] who is the primary provider for this patient.  I will contact them and ask them to assume care of this issues. . Of note her TSH was normal.  HISTORY OF PERICARDITIS:  I do not see documented history of this. She has no symptoms. There was no effusion. I will followup with an echocardiogram as described below.  CARDIOMYOPATHY:  I was able to review outside records. She's had  an ejection fraction as low as 40%. I do not see one documented lower. 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 Currently her EF is 60% and she's having no symptoms. However, I will order a followup echocardiogram late next month before her due date to make sure her EF is still good. She will need to be watched closely post pregnancy as well.  I will avoid adding medications at this point.  I counseled her on the dangers of getting pregnant.

## 2012-05-05 NOTE — Patient Instructions (Addendum)
The current medical regimen is effective;  continue present plan and medications.  Your physician has requested that you have an echocardiogram toward the end of May. Echocardiography is a painless test that uses sound waves to create images of your heart. It provides your doctor with information about the size and shape of your heart and how well your heart's chambers and valves are working. This procedure takes approximately one hour. There are no restrictions for this procedure.  Please follow up with Dr Antoine Poche 6 weeks after the delivery of your baby.

## 2012-05-15 ENCOUNTER — Ambulatory Visit: Payer: Self-pay | Admitting: Cardiology

## 2012-05-17 ENCOUNTER — Inpatient Hospital Stay (HOSPITAL_COMMUNITY)
Admission: AD | Admit: 2012-05-17 | Discharge: 2012-05-17 | Disposition: A | Discharge status: Home / Self Care | Payer: Medicaid Other | Source: Ambulatory Visit | Attending: Obstetrics and Gynecology | Admitting: Obstetrics and Gynecology

## 2012-05-17 ENCOUNTER — Encounter (HOSPITAL_COMMUNITY): Payer: Self-pay | Admitting: *Deleted

## 2012-05-17 DIAGNOSIS — M542 Cervicalgia: Secondary | ICD-10-CM | POA: Insufficient documentation

## 2012-05-17 DIAGNOSIS — O212 Late vomiting of pregnancy: Secondary | ICD-10-CM | POA: Insufficient documentation

## 2012-05-17 DIAGNOSIS — M549 Dorsalgia, unspecified: Secondary | ICD-10-CM | POA: Insufficient documentation

## 2012-05-17 DIAGNOSIS — O99891 Other specified diseases and conditions complicating pregnancy: Secondary | ICD-10-CM | POA: Insufficient documentation

## 2012-05-17 DIAGNOSIS — R197 Diarrhea, unspecified: Secondary | ICD-10-CM | POA: Insufficient documentation

## 2012-05-17 DIAGNOSIS — R42 Dizziness and giddiness: Secondary | ICD-10-CM | POA: Insufficient documentation

## 2012-05-17 LAB — URINALYSIS, ROUTINE W REFLEX MICROSCOPIC
Bilirubin Urine: NEGATIVE
Glucose, UA: NEGATIVE mg/dL
Ketones, ur: 15 mg/dL — AB
Nitrite: NEGATIVE
Specific Gravity, Urine: <1.005 — ABNORMAL LOW (ref 1.005–1.030)
pH: 6 (ref 5.0–8.0)

## 2012-05-17 MED ORDER — ACETAMINOPHEN 325 MG PO TABS
650.0000 mg | ORAL_TABLET | Freq: Four times a day (QID) | ORAL | Status: DC | PRN
  Administered 2012-05-17: 650 mg via ORAL
  Filled 2012-05-17: qty 2

## 2012-05-17 MED ORDER — ONDANSETRON HCL 4 MG PO TABS
4.0000 mg | ORAL_TABLET | Freq: Once | ORAL | Status: AC
  Administered 2012-05-17: 4 mg via ORAL
  Filled 2012-05-17: qty 1

## 2012-05-17 MED ORDER — CYCLOBENZAPRINE HCL 5 MG PO TABS: 5.0000 mg | ORAL_TABLET | Freq: Three times a day (TID) | ORAL | Status: DC | PRN

## 2012-05-17 MED ORDER — CYCLOBENZAPRINE HCL 10 MG PO TABS
5.0000 mg | ORAL_TABLET | Freq: Once | ORAL | Status: AC
  Administered 2012-05-17: 5 mg via ORAL
  Filled 2012-05-17: qty 1

## 2012-05-17 MED ORDER — ONDANSETRON HCL 4 MG PO TABS: 4.0000 mg | ORAL_TABLET | Freq: Three times a day (TID) | ORAL | Status: DC | PRN

## 2012-05-17 NOTE — Progress Notes (Signed)
History     CSN: [id number]  Arrival date & time 05/17/12  0116   First Provider Initiated Contact with Patient 05/17/12 0215      No chief complaint on file.   HPI Ms Faust is a 32yo [id number] at 31.6wks who became lightheaded and dizzy today at work. Works on [employer] all day. Stomach hurt. Tried to eat 1 hr later w/ emesis. Came home and slept. Tried to eat again w/ more n/v. Felt thirsty throughout the day. Felt tired throughout the day. Non-bloody diarrhea today x4. Multiple sick contacts in the home over the past 2 wks. Occasional crampy abdominal pain typically w/ fetal movement. Denies vaginal bleeding/discharge, HA, CP, SOB, contractions, LE edema, vision change, RUQ pain. Regular fetal movement. Associated w/ neck and shoulder stiffness and pain.    Past Medical History  Diagnosis Date  . Pericarditis 2011    Past Surgical History  Procedure Laterality Date  . No past surgeries      Family History  Problem Relation Age of Onset  . Heart disease Father   . Heart attack Father   . Cancer Sister 64    ovarian  . Stroke Father   . Brain cancer Maternal Grandmother   . Hypertension Mother   . Heart attack Paternal Grandmother     History  Substance Use Topics  . Smoking status: Never Smoker   . Smokeless tobacco: Never Used  . Alcohol Use: No    OB History   Grav Para Term Preterm Abortions TAB SAB Ect Mult Living   7 5 5  0 1 1 0 0 0 5      Review of Systems  Constitutional: Positive for appetite change. Negative for activity change.  HENT: Positive for neck pain. Negative for neck stiffness.   Cardiovascular: Negative for chest pain, palpitations and leg swelling.  Genitourinary: Negative for dysuria, flank pain, vaginal bleeding, vaginal discharge, difficulty urinating, vaginal pain and dyspareunia.  Neurological: Positive for dizziness and light-headedness. Negative for tremors, seizures, syncope, numbness and headaches.    Allergies  Penicillins  Home  Medications  No current outpatient prescriptions on file.  BP 105/60  Pulse 82  Temp(Src) 97.6 F (36.4 C) (Oral)  Resp 18  LMP 10/07/2011  Physical Exam  Constitutional: She is oriented to person, place, and time. She appears well-developed and well-nourished. No distress.  HENT:  Head: Normocephalic.  Eyes: EOM are normal. Pupils are equal, round, and reactive to light.  Neck: Normal range of motion. Neck supple.  Cardiovascular: Normal rate, regular rhythm and normal heart sounds.   Pulmonary/Chest: Effort normal and breath sounds normal.  Abdominal: Soft. She exhibits no distension. There is no tenderness.  Genitourinary: Vagina normal and uterus normal. No vaginal discharge found.  Musculoskeletal: Normal range of motion.  FROM of neck. Perispinal and shoulder muscle tightness w/ mild ttp.  Neurological: She is alert and oriented to person, place, and time. No cranial nerve deficit. Coordination normal.  Skin: Skin is warm and dry. No rash noted. No erythema.  FHR 130s + accels, no decels Rare ctx per toco  [facility] Course  Procedures (including critical care time)  Labs Reviewed  URINALYSIS, ROUTINE W REFLEX MICROSCOPIC - Abnormal; Notable for the following:    Specific Gravity, Urine <1.005 (*)    Ketones, ur 15 (*)    Urobilinogen, UA 2.0 (*)    All other components within normal limits   No results found.   No diagnosis found.   MDM  32yo [id number] at 31.6 presenting w/ likely viral gastroenteritis and mild dehydration. No evidence of contractions or fetal distress on monitor and cervix closed.  - Pt able to take PO, so will have pt drink instead of IVF for hydration - Zofran, Tylenol, Flexeril.    ------------------------------------------------------ Update Pts symptoms improved after above regimen. Tolerating PO at this time.  Will DC w/ Zofran and flexeril.  Precautions given  Shelly Flatten, MD Family Medicine PGY-2 05/17/2012, 4:05 AM  I participated in  the care of this patient and I agree with the above. Cam Hai 4:19 AM 05/17/2012

## 2012-05-17 NOTE — [facility] Note (Signed)
Pt reports nausea, vomiting and diarrhea since today. Pt reports feeling lightheaded and weak. Pt reports back and neck pain and was concerned. Pt states that she is feeling more pressureand is concerned because because "they had to stop her labor" with the last pregnancy, but then delivered full term.

## 2012-05-17 NOTE — Progress Notes (Signed)
Attestation of Attending Supervision of Advanced Practitioner (CNM/NP): Evaluation and management procedures were performed by the Advanced Practitioner under my supervision and collaboration.  I have reviewed the Advanced Practitioner's note and chart, and I agree with the management and plan.  Aliha Diedrich 05/17/2012 7:42 AM

## 2012-05-20 ENCOUNTER — Telehealth: Payer: Self-pay | Admitting: *Deleted

## 2012-05-20 NOTE — Telephone Encounter (Addendum)
Pt left message stating that she is 7mos pregnant and currently enrolled in [employer] school. She is wanting to go out on maternity leave. Her school has told her that they need a letter stating the following: name, dob, maternity leave to begin now and continue indefinitely. The letter can be faxed to [phone]. I returned pt's call and her mother answered, stating that Jennifer Dunlap was not there. She does not have a cell phone. I asked for her to give a message for [name] to call back tomorrow morning and ask to speak with me. Mom agreed and voiced understanding.  **Pt has not been seen @ clinic since 2/20, next appt is 4/14. She will need to be seen and evaluated in order for medical leave to be granted.  4/10  1535 - pt returned my call and we discussed her request. I stated that we cannot give her a medical leave from school until she is evaluated on 4/14 because we have not seen her since 2/20. Pt states that she had a visit to [facility] on 4/5 because she was having abdominal pain, dizziness, vomiting and diarrhea. She was told she had a virus. 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 She will have to pay extra fees without a doctor's excuse note. She plans to complete her education after the birth of her baby. I stated to pt that I can send a letter which states that she does not feel she can continue school at this time because of her discomforts of pregnancy. Pt stated that this will be acceptable and she will discuss further with provider at her visit on 4/14. Letter faxed to [phone] as requested.

## 2012-05-22 ENCOUNTER — Encounter: Payer: Self-pay | Admitting: *Deleted

## 2012-05-23 ENCOUNTER — Encounter: Payer: Self-pay | Admitting: *Deleted

## 2012-05-26 ENCOUNTER — Encounter: Payer: Self-pay | Admitting: Obstetrics and Gynecology

## 2012-05-26 ENCOUNTER — Ambulatory Visit (INDEPENDENT_AMBULATORY_CARE_PROVIDER_SITE_OTHER): Payer: Self-pay | Admitting: Obstetrics and Gynecology

## 2012-05-26 VITALS — BP 114/61 | Wt 172.0 lb

## 2012-05-26 DIAGNOSIS — Z3480 Encounter for supervision of other normal pregnancy, unspecified trimester: Secondary | ICD-10-CM | POA: Insufficient documentation

## 2012-05-26 DIAGNOSIS — E049 Nontoxic goiter, unspecified: Secondary | ICD-10-CM

## 2012-05-26 DIAGNOSIS — E010 Iodine-deficiency related diffuse (endemic) goiter: Secondary | ICD-10-CM

## 2012-05-26 DIAGNOSIS — Z3483 Encounter for supervision of other normal pregnancy, third trimester: Secondary | ICD-10-CM

## 2012-05-26 DIAGNOSIS — O0943 Supervision of pregnancy with grand multiparity, third trimester: Secondary | ICD-10-CM

## 2012-05-26 DIAGNOSIS — O0930 Supervision of pregnancy with insufficient antenatal care, unspecified trimester: Secondary | ICD-10-CM

## 2012-05-26 DIAGNOSIS — Z8679 Personal history of other diseases of the circulatory system: Secondary | ICD-10-CM

## 2012-05-26 LAB — CBC
HCT: 30.3 % — ABNORMAL LOW (ref 36.0–46.0)
Hemoglobin: 10.2 g/dL — ABNORMAL LOW (ref 12.0–15.0)
MCV: 83.9 fL (ref 78.0–100.0)
RDW: 13.3 % (ref 11.5–15.5)
WBC: 6.5 10*3/uL (ref 4.0–10.5)

## 2012-05-26 LAB — POCT URINALYSIS DIP (DEVICE)
Bilirubin Urine: NEGATIVE
Ketones, ur: NEGATIVE mg/dL
Protein, ur: NEGATIVE mg/dL
Specific Gravity, Urine: 1.02 (ref 1.005–1.030)

## 2012-05-26 NOTE — Addendum Note (Signed)
Addended by: Franchot Mimes on: 05/26/2012 09:00 AM   Modules accepted: Orders

## 2012-05-26 NOTE — Progress Notes (Signed)
Patient doing well without any complaints. FM/PTL precautions reviewed. Patient planning on using Paraguard IUD for birth control. 1 hr GCT today

## 2012-05-26 NOTE — Progress Notes (Signed)
Pulse: 74 1hr today due at 920

## 2012-05-27 ENCOUNTER — Encounter: Payer: Self-pay | Admitting: Obstetrics and Gynecology

## 2012-06-11 ENCOUNTER — Ambulatory Visit (INDEPENDENT_AMBULATORY_CARE_PROVIDER_SITE_OTHER): Payer: Self-pay | Admitting: Family Medicine

## 2012-06-11 ENCOUNTER — Other Ambulatory Visit: Payer: Self-pay | Admitting: Family Medicine

## 2012-06-11 VITALS — BP 121/74 | Temp 97.0°F | Wt 173.7 lb

## 2012-06-11 DIAGNOSIS — O0930 Supervision of pregnancy with insufficient antenatal care, unspecified trimester: Secondary | ICD-10-CM

## 2012-06-11 DIAGNOSIS — Z8679 Personal history of other diseases of the circulatory system: Secondary | ICD-10-CM

## 2012-06-11 DIAGNOSIS — O0933 Supervision of pregnancy with insufficient antenatal care, third trimester: Secondary | ICD-10-CM

## 2012-06-11 DIAGNOSIS — Z3483 Encounter for supervision of other normal pregnancy, third trimester: Secondary | ICD-10-CM

## 2012-06-11 LAB — OB RESULTS CONSOLE GBS: GBS: NEGATIVE

## 2012-06-11 LAB — POCT URINALYSIS DIP (DEVICE)
Bilirubin Urine: NEGATIVE
Glucose, UA: NEGATIVE mg/dL
Ketones, ur: NEGATIVE mg/dL
Leukocytes, UA: NEGATIVE
Nitrite: NEGATIVE
pH: 7.5 (ref 5.0–8.0)

## 2012-06-11 LAB — OB RESULTS CONSOLE GC/CHLAMYDIA: Chlamydia: NEGATIVE

## 2012-06-11 NOTE — Progress Notes (Signed)
No complaints

## 2012-06-11 NOTE — Progress Notes (Signed)
Pulse-76  Edema-feet

## 2012-06-11 NOTE — Patient Instructions (Signed)
Pregnancy - Third Trimester  The third trimester of pregnancy (the last 3 months) is a period of the most rapid growth for you and your baby. The baby approaches a length of 20 inches and a weight of 6 to 10 pounds. The baby is adding on fat and getting ready for life outside your body. While inside, babies have periods of sleeping and waking, suck their thumbs, and hiccups. You can often feel small contractions of the uterus. This is false labor. It is also called Braxton-Hicks contractions. This is like a practice for labor. The usual problems in this stage of pregnancy include more difficulty breathing, swelling of the hands and feet from water retention, and having to urinate more often because of the uterus and baby pressing on your bladder.   PRENATAL EXAMS  · Blood work may continue to be done during prenatal exams. These tests are done to check on your health and the probable health of your baby. Blood work is used to follow your blood levels (hemoglobin). Anemia (low hemoglobin) is common during pregnancy. Iron and vitamins are given to help prevent this. You may also continue to be checked for diabetes. Some of the past blood tests may be done again.  · The size of the uterus is measured during each visit. This makes sure your baby is growing properly according to your pregnancy dates.  · Your blood pressure is checked every prenatal visit. This is to make sure you are not getting toxemia.  · Your urine is checked every prenatal visit for infection, diabetes and protein.  · Your weight is checked at each visit. This is done to make sure gains are happening at the suggested rate and that you and your baby are growing normally.  · Sometimes, an ultrasound is performed to confirm the position and the proper growth and development of the baby. This is a test done that bounces harmless sound waves off the baby so your caregiver can more accurately determine due dates.  · Discuss the type of pain medication and  anesthesia you will have during your labor and delivery.  · Discuss the possibility and anesthesia if a Cesarean Section might be necessary.  · Inform your caregiver if there is any mental or physical violence at home.  Sometimes, a specialized non-stress test, contraction stress test and biophysical profile are done to make sure the baby is not having a problem. Checking the amniotic fluid surrounding the baby is called an amniocentesis. The amniotic fluid is removed by sticking a needle into the belly (abdomen). This is sometimes done near the end of pregnancy if an early delivery is required. In this case, it is done to help make sure the baby's lungs are mature enough for the baby to live outside of the womb. If the lungs are not mature and it is unsafe to deliver the baby, an injection of cortisone medication is given to the mother 1 to 2 days before the delivery. This helps the baby's lungs mature and makes it safer to deliver the baby.  CHANGES OCCURING IN THE THIRD TRIMESTER OF PREGNANCY  Your body goes through many changes during pregnancy. They vary from person to person. Talk to your caregiver about changes you notice and are concerned about.  · During the last trimester, you have probably had an increase in your appetite. It is normal to have cravings for certain foods. This varies from person to person and pregnancy to pregnancy.  · You may begin to   get stretch marks on your hips, abdomen, and breasts. These are normal changes in the body during pregnancy. There are no exercises or medications to take which prevent this change.  · Constipation may be treated with a stool softener or adding bulk to your diet. Drinking lots of fluids, fiber in vegetables, fruits, and whole grains are helpful.  · Exercising is also helpful. If you have been very active up until your pregnancy, most of these activities can be continued during your pregnancy. If you have been less active, it is helpful to start an exercise  program such as walking. Consult your caregiver before starting exercise programs.  · Avoid all smoking, alcohol, un-prescribed drugs, herbs and "street drugs" during your pregnancy. These chemicals affect the formation and growth of the baby. Avoid chemicals throughout the pregnancy to ensure the delivery of a healthy infant.  · Backache, varicose veins and hemorrhoids may develop or get worse.  · You will tire more easily in the third trimester, which is normal.  · The baby's movements may be stronger and more often.  · You may become short of breath easily.  · Your belly button may stick out.  · A yellow discharge may leak from your breasts called colostrum.  · You may have a bloody mucus discharge. This usually occurs a few days to a week before labor begins.  HOME CARE INSTRUCTIONS   · Keep your caregiver's appointments. Follow your caregiver's instructions regarding medication use, exercise, and diet.  · During pregnancy, you are providing food for you and your baby. Continue to eat regular, well-balanced meals. Choose foods such as meat, fish, milk and other low fat dairy products, vegetables, fruits, and whole-grain breads and cereals. Your caregiver will tell you of the ideal weight gain.  · A physical sexual relationship may be continued throughout pregnancy if there are no other problems such as early (premature) leaking of amniotic fluid from the membranes, vaginal bleeding, or belly (abdominal) pain.  · Exercise regularly if there are no restrictions. Check with your caregiver if you are unsure of the safety of your exercises. Greater weight gain will occur in the last 2 trimesters of pregnancy. Exercising helps:  · Control your weight.  · Get you in shape for labor and delivery.  · You lose weight after you deliver.  · Rest a lot with legs elevated, or as needed for leg cramps or low back pain.  · Wear a good support or jogging bra for breast tenderness during pregnancy. This may help if worn during  sleep. Pads or tissues may be used in the bra if you are leaking colostrum.  · Do not use hot tubs, steam rooms, or saunas.  · Wear your seat belt when driving. This protects you and your baby if you are in an accident.  · Avoid raw meat, cat litter boxes and soil used by cats. These carry germs that can cause birth defects in the baby.  · It is easier to loose urine during pregnancy. Tightening up and strengthening the pelvic muscles will help with this problem. You can practice stopping your urination while you are going to the bathroom. These are the same muscles you need to strengthen. It is also the muscles you would use if you were trying to stop from passing gas. You can practice tightening these muscles up 10 times a set and repeating this about 3 times per day. Once you know what muscles to tighten up, do not perform these   exercises during urination. It is more likely to cause an infection by backing up the urine.  · Ask for help if you have financial, counseling or nutritional needs during pregnancy. Your caregiver will be able to offer counseling for these needs as well as refer you for other special needs.  · Make a list of emergency phone numbers and have them available.  · Plan on getting help from family or friends when you go home from the hospital.  · Make a trial run to the hospital.  · Take prenatal classes with the father to understand, practice and ask questions about the labor and delivery.  · Prepare the baby's room/nursery.  · Do not travel out of the city unless it is absolutely necessary and with the advice of your caregiver.  · Wear only low or no heal shoes to have better balance and prevent falling.  MEDICATIONS AND DRUG USE IN PREGNANCY  · Take prenatal vitamins as directed. The vitamin should contain 1 milligram of folic acid. Keep all vitamins out of reach of children. Only a couple vitamins or tablets containing iron may be fatal to a baby or young child when ingested.  · Avoid use  of all medications, including herbs, over-the-counter medications, not prescribed or suggested by your caregiver. Only take over-the-counter or prescription medicines for pain, discomfort, or fever as directed by your caregiver. Do not use aspirin, ibuprofen (Motrin®, Advil®, Nuprin®) or naproxen (Aleve®) unless OK'd by your caregiver.  · Let your caregiver also know about herbs you may be using.  · Alcohol is related to a number of birth defects. This includes fetal alcohol syndrome. All alcohol, in any form, should be avoided completely. Smoking will cause low birth rate and premature babies.  · Street/illegal drugs are very harmful to the baby. They are absolutely forbidden. A baby born to an addicted mother will be addicted at birth. The baby will go through the same withdrawal an adult does.  SEEK MEDICAL CARE IF:  You have any concerns or worries during your pregnancy. It is better to call with your questions if you feel they cannot wait, rather than worry about them.  DECISIONS ABOUT CIRCUMCISION  You may or may not know the sex of your baby. If you know your baby is a boy, it may be time to think about circumcision. Circumcision is the removal of the foreskin of the penis. This is the skin that covers the sensitive end of the penis. There is no proven medical need for this. Often this decision is made on what is popular at the time or based upon religious beliefs and social issues. You can discuss these issues with your caregiver or pediatrician.  SEEK IMMEDIATE MEDICAL CARE IF:   · An unexplained oral temperature above 102° F (38.9° C) develops, or as your caregiver suggests.  · You have leaking of fluid from the vagina (birth canal). If leaking membranes are suspected, take your temperature and tell your caregiver of this when you call.  · There is vaginal spotting, bleeding or passing clots. Tell your caregiver of the amount and how many pads are used.  · You develop a bad smelling vaginal discharge with  a change in the color from clear to white.  · You develop vomiting that lasts more than 24 hours.  · You develop chills or fever.  · You develop shortness of breath.  · You develop burning on urination.  · You loose more than 2 pounds of weight   or gain more than 2 pounds of weight or as suggested by your caregiver.  · You notice sudden swelling of your face, hands, and feet or legs.  · You develop belly (abdominal) pain. Round ligament discomfort is a common non-cancerous (benign) cause of abdominal pain in pregnancy. Your caregiver still must evaluate you.  · You develop a severe headache that does not go away.  · You develop visual problems, blurred or double vision.  · If you have not felt your baby move for more than 1 hour. If you think the baby is not moving as much as usual, eat something with sugar in it and lie down on your left side for an hour. The baby should move at least 4 to 5 times per hour. Call right away if your baby moves less than that.  · You fall, are in a car accident or any kind of trauma.  · There is mental or physical violence at home.  Document Released: 01/23/2001 Document Revised: 04/23/2011 Document Reviewed: 07/28/2008  Exit[employer]® Patient Information ©2013 [employer].

## 2012-06-12 LAB — GC/CHLAMYDIA PROBE AMP: GC Probe RNA: NEGATIVE

## 2012-06-14 LAB — CULTURE, BETA STREP (GROUP B ONLY)

## 2012-06-15 ENCOUNTER — Encounter: Payer: Self-pay | Admitting: Family Medicine

## 2012-06-18 ENCOUNTER — Ambulatory Visit (INDEPENDENT_AMBULATORY_CARE_PROVIDER_SITE_OTHER): Payer: Self-pay | Admitting: Family Medicine

## 2012-06-18 ENCOUNTER — Other Ambulatory Visit: Payer: Self-pay | Admitting: Obstetrics & Gynecology

## 2012-06-18 VITALS — BP 128/72 | Wt 174.0 lb

## 2012-06-18 DIAGNOSIS — Z3483 Encounter for supervision of other normal pregnancy, third trimester: Secondary | ICD-10-CM

## 2012-06-18 DIAGNOSIS — O0930 Supervision of pregnancy with insufficient antenatal care, unspecified trimester: Secondary | ICD-10-CM

## 2012-06-18 LAB — POCT URINALYSIS DIP (DEVICE)
Bilirubin Urine: NEGATIVE
Glucose, UA: NEGATIVE mg/dL
Ketones, ur: NEGATIVE mg/dL
Leukocytes, UA: NEGATIVE
Specific Gravity, Urine: >=1.03 (ref 1.005–1.030)

## 2012-06-18 NOTE — Patient Instructions (Signed)
Pregnancy - Third Trimester  The third trimester of pregnancy (the last 3 months) is a period of the most rapid growth for you and your baby. The baby approaches a length of 20 inches and a weight of 6 to 10 pounds. The baby is adding on fat and getting ready for life outside your body. While inside, babies have periods of sleeping and waking, suck their thumbs, and hiccups. You can often feel small contractions of the uterus. This is false labor. It is also called Braxton-Hicks contractions. This is like a practice for labor. The usual problems in this stage of pregnancy include more difficulty breathing, swelling of the hands and feet from water retention, and having to urinate more often because of the uterus and baby pressing on your bladder.   PRENATAL EXAMS  · Blood work may continue to be done during prenatal exams. These tests are done to check on your health and the probable health of your baby. Blood work is used to follow your blood levels (hemoglobin). Anemia (low hemoglobin) is common during pregnancy. Iron and vitamins are given to help prevent this. You may also continue to be checked for diabetes. Some of the past blood tests may be done again.  · The size of the uterus is measured during each visit. This makes sure your baby is growing properly according to your pregnancy dates.  · Your blood pressure is checked every prenatal visit. This is to make sure you are not getting toxemia.  · Your urine is checked every prenatal visit for infection, diabetes and protein.  · Your weight is checked at each visit. This is done to make sure gains are happening at the suggested rate and that you and your baby are growing normally.  · Sometimes, an ultrasound is performed to confirm the position and the proper growth and development of the baby. This is a test done that bounces harmless sound waves off the baby so your caregiver can more accurately determine due dates.  · Discuss the type of pain medication and  anesthesia you will have during your labor and delivery.  · Discuss the possibility and anesthesia if a Cesarean Section might be necessary.  · Inform your caregiver if there is any mental or physical violence at home.  Sometimes, a specialized non-stress test, contraction stress test and biophysical profile are done to make sure the baby is not having a problem. Checking the amniotic fluid surrounding the baby is called an amniocentesis. The amniotic fluid is removed by sticking a needle into the belly (abdomen). This is sometimes done near the end of pregnancy if an early delivery is required. In this case, it is done to help make sure the baby's lungs are mature enough for the baby to live outside of the womb. If the lungs are not mature and it is unsafe to deliver the baby, an injection of cortisone medication is given to the mother 1 to 2 days before the delivery. This helps the baby's lungs mature and makes it safer to deliver the baby.  CHANGES OCCURING IN THE THIRD TRIMESTER OF PREGNANCY  Your body goes through many changes during pregnancy. They vary from person to person. Talk to your caregiver about changes you notice and are concerned about.  · During the last trimester, you have probably had an increase in your appetite. It is normal to have cravings for certain foods. This varies from person to person and pregnancy to pregnancy.  · You may begin to   get stretch marks on your hips, abdomen, and breasts. These are normal changes in the body during pregnancy. There are no exercises or medications to take which prevent this change.  · Constipation may be treated with a stool softener or adding bulk to your diet. Drinking lots of fluids, fiber in vegetables, fruits, and whole grains are helpful.  · Exercising is also helpful. If you have been very active up until your pregnancy, most of these activities can be continued during your pregnancy. If you have been less active, it is helpful to start an exercise  program such as walking. Consult your caregiver before starting exercise programs.  · Avoid all smoking, alcohol, un-prescribed drugs, herbs and "street drugs" during your pregnancy. These chemicals affect the formation and growth of the baby. Avoid chemicals throughout the pregnancy to ensure the delivery of a healthy infant.  · Backache, varicose veins and hemorrhoids may develop or get worse.  · You will tire more easily in the third trimester, which is normal.  · The baby's movements may be stronger and more often.  · You may become short of breath easily.  · Your belly button may stick out.  · A yellow discharge may leak from your breasts called colostrum.  · You may have a bloody mucus discharge. This usually occurs a few days to a week before labor begins.  HOME CARE INSTRUCTIONS   · Keep your caregiver's appointments. Follow your caregiver's instructions regarding medication use, exercise, and diet.  · During pregnancy, you are providing food for you and your baby. Continue to eat regular, well-balanced meals. Choose foods such as meat, fish, milk and other low fat dairy products, vegetables, fruits, and whole-grain breads and cereals. Your caregiver will tell you of the ideal weight gain.  · A physical sexual relationship may be continued throughout pregnancy if there are no other problems such as early (premature) leaking of amniotic fluid from the membranes, vaginal bleeding, or belly (abdominal) pain.  · Exercise regularly if there are no restrictions. Check with your caregiver if you are unsure of the safety of your exercises. Greater weight gain will occur in the last 2 trimesters of pregnancy. Exercising helps:  · Control your weight.  · Get you in shape for labor and delivery.  · You lose weight after you deliver.  · Rest a lot with legs elevated, or as needed for leg cramps or low back pain.  · Wear a good support or jogging bra for breast tenderness during pregnancy. This may help if worn during  sleep. Pads or tissues may be used in the bra if you are leaking colostrum.  · Do not use hot tubs, steam rooms, or saunas.  · Wear your seat belt when driving. This protects you and your baby if you are in an accident.  · Avoid raw meat, cat litter boxes and soil used by cats. These carry germs that can cause birth defects in the baby.  · It is easier to loose urine during pregnancy. Tightening up and strengthening the pelvic muscles will help with this problem. You can practice stopping your urination while you are going to the bathroom. These are the same muscles you need to strengthen. It is also the muscles you would use if you were trying to stop from passing gas. You can practice tightening these muscles up 10 times a set and repeating this about 3 times per day. Once you know what muscles to tighten up, do not perform these   exercises during urination. It is more likely to cause an infection by backing up the urine.  · Ask for help if you have financial, counseling or nutritional needs during pregnancy. Your caregiver will be able to offer counseling for these needs as well as refer you for other special needs.  · Make a list of emergency phone numbers and have them available.  · Plan on getting help from family or friends when you go home from the hospital.  · Make a trial run to the hospital.  · Take prenatal classes with the father to understand, practice and ask questions about the labor and delivery.  · Prepare the baby's room/nursery.  · Do not travel out of the city unless it is absolutely necessary and with the advice of your caregiver.  · Wear only low or no heal shoes to have better balance and prevent falling.  MEDICATIONS AND DRUG USE IN PREGNANCY  · Take prenatal vitamins as directed. The vitamin should contain 1 milligram of folic acid. Keep all vitamins out of reach of children. Only a couple vitamins or tablets containing iron may be fatal to a baby or young child when ingested.  · Avoid use  of all medications, including herbs, over-the-counter medications, not prescribed or suggested by your caregiver. Only take over-the-counter or prescription medicines for pain, discomfort, or fever as directed by your caregiver. Do not use aspirin, ibuprofen (Motrin®, Advil®, Nuprin®) or naproxen (Aleve®) unless OK'd by your caregiver.  · Let your caregiver also know about herbs you may be using.  · Alcohol is related to a number of birth defects. This includes fetal alcohol syndrome. All alcohol, in any form, should be avoided completely. Smoking will cause low birth rate and premature babies.  · Street/illegal drugs are very harmful to the baby. They are absolutely forbidden. A baby born to an addicted mother will be addicted at birth. The baby will go through the same withdrawal an adult does.  SEEK MEDICAL CARE IF:  You have any concerns or worries during your pregnancy. It is better to call with your questions if you feel they cannot wait, rather than worry about them.  DECISIONS ABOUT CIRCUMCISION  You may or may not know the sex of your baby. If you know your baby is a boy, it may be time to think about circumcision. Circumcision is the removal of the foreskin of the penis. This is the skin that covers the sensitive end of the penis. There is no proven medical need for this. Often this decision is made on what is popular at the time or based upon religious beliefs and social issues. You can discuss these issues with your caregiver or pediatrician.  SEEK IMMEDIATE MEDICAL CARE IF:   · An unexplained oral temperature above 102° F (38.9° C) develops, or as your caregiver suggests.  · You have leaking of fluid from the vagina (birth canal). If leaking membranes are suspected, take your temperature and tell your caregiver of this when you call.  · There is vaginal spotting, bleeding or passing clots. Tell your caregiver of the amount and how many pads are used.  · You develop a bad smelling vaginal discharge with  a change in the color from clear to white.  · You develop vomiting that lasts more than 24 hours.  · You develop chills or fever.  · You develop shortness of breath.  · You develop burning on urination.  · You loose more than 2 pounds of weight   or gain more than 2 pounds of weight or as suggested by your caregiver.  · You notice sudden swelling of your face, hands, and feet or legs.  · You develop belly (abdominal) pain. Round ligament discomfort is a common non-cancerous (benign) cause of abdominal pain in pregnancy. Your caregiver still must evaluate you.  · You develop a severe headache that does not go away.  · You develop visual problems, blurred or double vision.  · If you have not felt your baby move for more than 1 hour. If you think the baby is not moving as much as usual, eat something with sugar in it and lie down on your left side for an hour. The baby should move at least 4 to 5 times per hour. Call right away if your baby moves less than that.  · You fall, are in a car accident or any kind of trauma.  · There is mental or physical violence at home.  Document Released: 01/23/2001 Document Revised: 04/23/2011 Document Reviewed: 07/28/2008  Exit[employer]® Patient Information ©2013 [employer].

## 2012-06-18 NOTE — Progress Notes (Signed)
No complaints

## 2012-06-18 NOTE — Progress Notes (Signed)
Pulse- 85 Patient reports lower back pain and vaginal pressure

## 2012-06-25 ENCOUNTER — Ambulatory Visit (INDEPENDENT_AMBULATORY_CARE_PROVIDER_SITE_OTHER): Payer: Self-pay | Admitting: Advanced Practice Midwife

## 2012-06-25 VITALS — BP 111/65 | Temp 97.0°F | Wt 171.8 lb

## 2012-06-25 DIAGNOSIS — O0943 Supervision of pregnancy with grand multiparity, third trimester: Secondary | ICD-10-CM

## 2012-06-25 LAB — POCT URINALYSIS DIP (DEVICE)
Bilirubin Urine: NEGATIVE
Glucose, UA: NEGATIVE mg/dL
Hgb urine dipstick: NEGATIVE
Specific Gravity, Urine: 1.025 (ref 1.005–1.030)

## 2012-06-25 NOTE — Patient Instructions (Signed)
Pregnancy - Third Trimester The third trimester begins at the 28th week of pregnancy and ends at birth. It is important to follow your doctor's instructions. HOME CARE   Go to your doctor's visits.  Do not smoke.  Do not drink alcohol or use drugs.  Only take medicine as told by your doctor.  Take prenatal vitamins as told. The vitamin should contain 1 milligram of folic acid.  Exercise.  Eat healthy foods. Eat regular, well-balanced meals.  You can have sex (intercourse) if there are no other problems with the pregnancy.  Do not use hot tubs, steam rooms, or saunas.  Wear a seat belt while driving.  Avoid raw meat, uncooked cheese, and litter boxes and soil used by cats.  Rest with your legs raised (elevated).  Make a list of emergency phone numbers. Keep this list with you.  Arrange for help when you come back home after delivering the baby.  Make a trial run to the hospital.  Take prenatal classes.  Prepare the baby's nursery.  Do not travel out of the city. If you absolutely have to, get permission from your doctor first.  Wear flat shoes. Do not wear high heels. GET HELP RIGHT AWAY IF:   You have a temperature by mouth above 102 F (38.9 C), not controlled by medicine.  You have not felt the baby move for more than 1 hour. If you think the baby is not moving as much as normal, eat something with sugar in it or lie down on your left side for an hour. The baby should move at least 4 to 5 times per hour.  Fluid is coming from the vagina.  Blood is coming from the vagina. Light spotting is common, especially after sex (intercourse).  You have belly (abdominal) pain.  You have a bad smelling fluid (discharge) coming from the vagina. The fluid changes from clear to white.  You still feel sick to your stomach (nauseous).  You throw up (vomit) for more than 24 hours.  You have the chills.  You have shortness of breath.  You have a burning feeling when you  pee (urinate).  You lose or gain more than 2 pounds (0.9 kilograms) of weight over a week, or as told by your doctor.  Your face, hands, feet, or legs get puffy (swell).  You have a bad headache that will not go away.  You start to have problems seeing (blurry or double vision).  You fall, are in a car accident, or have any kind of trauma.  There is mental or physical violence at home.  You have any concerns or worries during your pregnancy. MAKE SURE YOU:   Understand these instructions.  Will watch your condition.  Will get help right away if you are not doing well or get worse. Document Released: 04/25/2009 Document Revised: 04/23/2011 Document Reviewed: 04/25/2009 [employer] Patient Information 2013 [employer].  

## 2012-06-25 NOTE — Progress Notes (Signed)
Pulse- 75 Patient reports a pain/burning in her vagina and occasional contractions

## 2012-06-25 NOTE — Progress Notes (Signed)
Reports irregular contractions, pelvic/vaginal discomfort c/w normal late pregnancy changes. Cervix fingertip, rev'd labor precuations.

## 2012-07-01 ENCOUNTER — Inpatient Hospital Stay (HOSPITAL_COMMUNITY)
Admission: AD | Admit: 2012-07-01 | Discharge: 2012-07-01 | Disposition: A | Discharge status: Home / Self Care | Payer: Medicaid Other | Source: Ambulatory Visit | Attending: Family Medicine | Admitting: Family Medicine

## 2012-07-01 ENCOUNTER — Encounter (HOSPITAL_COMMUNITY): Payer: Self-pay | Admitting: *Deleted

## 2012-07-01 ENCOUNTER — Ambulatory Visit (HOSPITAL_COMMUNITY): Payer: Medicaid Other | Attending: Cardiovascular Disease

## 2012-07-01 DIAGNOSIS — M549 Dorsalgia, unspecified: Secondary | ICD-10-CM | POA: Insufficient documentation

## 2012-07-01 DIAGNOSIS — O99891 Other specified diseases and conditions complicating pregnancy: Secondary | ICD-10-CM | POA: Insufficient documentation

## 2012-07-01 DIAGNOSIS — Z8679 Personal history of other diseases of the circulatory system: Secondary | ICD-10-CM

## 2012-07-01 DIAGNOSIS — I300 Acute nonspecific idiopathic pericarditis: Secondary | ICD-10-CM

## 2012-07-01 DIAGNOSIS — Z3483 Encounter for supervision of other normal pregnancy, third trimester: Secondary | ICD-10-CM

## 2012-07-01 DIAGNOSIS — E010 Iodine-deficiency related diffuse (endemic) goiter: Secondary | ICD-10-CM

## 2012-07-01 MED ORDER — CYCLOBENZAPRINE HCL 10 MG PO TABS
10.0000 mg | ORAL_TABLET | Freq: Once | ORAL | Status: AC
  Administered 2012-07-01: 10 mg via ORAL
  Filled 2012-07-01: qty 1

## 2012-07-01 NOTE — [facility] Note (Signed)
Pt reports back pain for "the last couple of days". Tonight the pain in more constant and pt reports more pressure. Pt also noticed an occasional shooting pain along the right side of her abdomen. Pt reports + FM. Denies VB or LOF.

## 2012-07-01 NOTE — Progress Notes (Signed)
Echocardiogram performed.  

## 2012-07-02 ENCOUNTER — Ambulatory Visit (INDEPENDENT_AMBULATORY_CARE_PROVIDER_SITE_OTHER): Payer: Medicaid Other | Admitting: Family Medicine

## 2012-07-02 VITALS — BP 118/75 | Temp 97.4°F | Wt 171.7 lb

## 2012-07-02 DIAGNOSIS — O0930 Supervision of pregnancy with insufficient antenatal care, unspecified trimester: Secondary | ICD-10-CM

## 2012-07-02 DIAGNOSIS — Z8679 Personal history of other diseases of the circulatory system: Secondary | ICD-10-CM

## 2012-07-02 DIAGNOSIS — O0933 Supervision of pregnancy with insufficient antenatal care, third trimester: Secondary | ICD-10-CM

## 2012-07-02 NOTE — Patient Instructions (Addendum)
Vaginal Delivery  Your caregiver must first be sure you are in labor. Signs of labor include:   You may pass what is called "the mucus plug" before labor begins. This is a small amount of blood stained mucus.   Regular uterine contractions.   The time between contractions get closer together.   The discomfort and pain gradually gets more intense.   Pains are mostly located in the back.   Pains get worse when walking.   The cervix (the opening of the uterus) becomes thinner (begins to efface) and opens up (dilates).  Once you are in labor and admitted into the hospital or care center, your caregiver will do the following:   A complete physical examination.   Check your vital signs (blood pressure, pulse, temperature and the fetal heart rate).   Do a vaginal examination (using a sterile glove and lubricant) to determine:   The position (presentation) of the baby (head [vertex] or buttock first).   The level (station) of the baby's head in the birth canal.   The effacement and dilatation of the cervix.   You may have your pubic hair shaved and be given an enema depending on your caregiver and the circumstance.   An electronic monitor is usually placed on your abdomen. The monitor follows the length and intensity of the contractions, as well as the baby's heart rate.   Usually, your caregiver will insert an IV in your arm with a bottle of sugar water. This is done as a precaution so that medications can be given to you quickly during labor or delivery.  NORMAL LABOR AND DELIVERY IS DIVIDED UP INTO 3 STAGES:  First Stage  This is when regular contractions begin and the cervix begins to efface and dilate. This stage can last from 3 to 15 hours. The end of the first stage is when the cervix is 100% effaced and 10 centimeters dilated. Pain medications may be given by    Injection (morphine, demerol, etc.)    Regional anesthesia (spinal, caudal or epidural, anesthetics given in different locations of the spine). Paracervical pain medication may be given, which is an injection of and anesthetic on each side of the cervix.  A pregnant woman may request to have "Natural Childbirth" which is not to have any medications or anesthesia during her labor and delivery.  Second Stage  This is when the baby comes down through the birth canal (vagina) and is born. This can take 1 to 4 hours. As the baby's head comes down through the birth canal, you may feel like you are going to have a bowel movement. You will get the urge to bear down and push until the baby is delivered. As the baby's head is being delivered, the caregiver will decide if an episiotomy (a cut in the perineum and vagina area) is needed to prevent tearing of the tissue in this area. The episiotomy is sewn up after the delivery of the baby and placenta. Sometimes a mask with nitrous oxide is given for the mother to breath during the delivery of the baby to help if there is too much pain. The end of Stage 2 is when the baby is fully delivered. Then when the umbilical cord stops pulsating it is clamped and cut.  Third Stage  The third stage begins after the baby is completely delivered and ends after the placenta (afterbirth) is delivered. This usually takes 5 to 30 minutes. After the placenta is delivered, a medication is given   either by intravenous or injection to help contract the uterus and prevent bleeding. The third stage is not painful and pain medication is usually not necessary. If an episiotomy was done, it is repaired at this time.  After the delivery, the mother is watched and monitored closely for 1 to 2 hours to make sure there is no postpartum bleeding (hemorrhage). If there is a lot of bleeding, medication is given to contract the uterus and stop the bleeding.  Document Released: 11/08/2007 Document Revised: 10/24/2011 Document Reviewed: 11/08/2007   [employer] Patient Information 2014 [employer].

## 2012-07-02 NOTE — Progress Notes (Signed)
P = 95   Pt went to MAU last night for labor eval.  Today is having irregular UC's <4/hr- back pain. Pt reports that her Lt leg has been going numb. Pt had echocardiogram @ [place] [facility] yesterday and wants to know the results.

## 2012-07-02 NOTE — Progress Notes (Signed)
Jennifer Dunlap is a 32 y.o. female [phone] at [redacted]w[redacted]d who presents today for a routine prenatal visit.  Patient went to MAU last night for labor evaluation and back pain.  Last night, her cervix was only 1cm.  She was treated flexeril and has no complaints of back pain today.  Patient states she has been having "back labor" in her pregnancy with her son and is now having these irregular contractions causing this back pain for the last 2 days. Pt had echocardiogram @ [place] [facility] yesterday and wants to know the results. She denies discharge or vaginal bleeding.  She feels the baby move regularly.  She has had vaginal deliveries in the past and is hoping for SVD in this delivery.  She is hoping for IUD for her birth control needs and will continue to be seen here.

## 2012-07-03 NOTE — Progress Notes (Signed)
I saw and examined the patient and agree with student note. No concerns today. Declined cervical check.

## 2012-07-09 ENCOUNTER — Inpatient Hospital Stay (HOSPITAL_COMMUNITY)
Admission: AD | Admit: 2012-07-09 | Discharge: 2012-07-09 | Disposition: A | Discharge status: Home / Self Care | Payer: Medicaid Other | Source: Ambulatory Visit | Attending: Obstetrics & Gynecology | Admitting: Obstetrics & Gynecology

## 2012-07-09 ENCOUNTER — Encounter: Payer: Medicaid Other | Admitting: Obstetrics and Gynecology

## 2012-07-09 ENCOUNTER — Encounter (HOSPITAL_COMMUNITY): Payer: Self-pay | Admitting: Obstetrics

## 2012-07-09 DIAGNOSIS — IMO0002 Reserved for concepts with insufficient information to code with codable children: Secondary | ICD-10-CM

## 2012-07-09 DIAGNOSIS — O479 False labor, unspecified: Secondary | ICD-10-CM | POA: Insufficient documentation

## 2012-07-09 DIAGNOSIS — R209 Unspecified disturbances of skin sensation: Secondary | ICD-10-CM | POA: Insufficient documentation

## 2012-07-09 NOTE — MAU Note (Signed)
Pt states she is having a tingling feeling in both legs and it feels like they are going numb when she has a contraction

## 2012-07-09 NOTE — MAU Provider Note (Signed)
History     CSN: [id number]  Arrival date and time: 07/09/12 4098   First Provider Initiated Contact with Patient 07/09/12 [phone]     Chief Complaint  Patient presents with  . Labor Eval   HPI Jennifer Dunlap is a 32 y.o. [id number] at [redacted]w[redacted]d who presents for labor check/complaints of contractions.   Patient reports contractions that began at 730-8 pm and were about 7 minutes apart.  Contractions have been getting more frequent and intense, which prompted her to come to the MAU.  She denies loss of fluid, vaginal bleeding, or vaginal discharge.  She does report considerable pain with contractions, and associated numbness/tingling of her lower extremities.    ROS: See below  Past Medical History  Diagnosis Date  . Pericarditis 2011    Past Surgical History  Procedure Laterality Date  . No past surgeries      Family History  Problem Relation Age of Onset  . Heart disease Father   . Heart attack Father   . Cancer Sister 30    ovarian  . Stroke Father   . Brain cancer Maternal Grandmother   . Hypertension Mother   . Heart attack Paternal Grandmother     History  Substance Use Topics  . Smoking status: Never Smoker   . Smokeless tobacco: Never Used  . Alcohol Use: No    Allergies:  Allergies  Allergen Reactions  . Penicillins Anaphylaxis    Has not had reaction to Amoxicillin yet.    Prescriptions prior to admission  Medication Sig Dispense Refill  . acetaminophen (TYLENOL) 500 MG tablet Take 1,000 mg by mouth every 8 (eight) hours as needed. For pain      . cyclobenzaprine (FLEXERIL) 5 MG tablet Take 1 tablet (5 mg total) by mouth 3 (three) times daily as needed for muscle spasms.  30 tablet  0  . ondansetron (ZOFRAN) 4 MG tablet Take 1 tablet (4 mg total) by mouth every 8 (eight) hours as needed for nausea.  20 tablet  0    Review of Systems  Constitutional: Negative for fever and chills.  Eyes: Negative for blurred vision.  Respiratory: Negative for shortness of  breath.   Cardiovascular: Negative for leg swelling.  Gastrointestinal: Negative for nausea and vomiting.  Neurological: Negative for headaches.       Patient reports numbness/tingling of her legs with contractions.    Physical Exam   Blood pressure 108/59, pulse 80, temperature 98.1 F (36.7 C), temperature source Oral, resp. rate 16, height 5\' 3"  (1.6 m), weight 78.019 kg (172 lb), last menstrual period 10/07/2011, SpO2 100.00%.  Physical Exam Gen: resting in bed, appears uncomfortable with contractions. Heart: RRR. No murmurs, rubs, or gallops. Lungs: CTAB. No rales, rhonchi, or wheezing. Abd: gravid but otherwise soft, nontender to palpation Ext: no appreciable lower extremity edema bilaterally Neuro: No focal deficits. Cervical exam:  Dilation: 1 Effacement (%): Thick Cervical Position: Posterior Station: -2 Presentation: Vertex Exam by:: K Fraley RN  FHR: baseline 130, mod variability, 15x15 accels, No decels Toco: Every 5 mins  MAU Course  Procedures  Assessment and Plan  Jennifer Dunlap is a 32 y.o. [id number] at [redacted]w[redacted]d who presents for labor check. - Patient now uncomfortable with and contractions becoming more frequent (Every 8 --> Every 5).  However, little cervical change. - Given Multiparous status will monitor closely for an additional hour and we then recheck Cervix for change.  If progresses will admit to L&D, if no/little change will plan to  D/C home.  0600 - Repeat Cervical exam - 1.5/Thick/Posterior.  Will discharge home.  Return instructions discussed.  [name] 07/09/2012, 5:01 AM   I collaborated in the care of this patient and I agree with the above. Cam Hai 9:01 AM 07/09/2012

## 2012-07-13 ENCOUNTER — Inpatient Hospital Stay (HOSPITAL_COMMUNITY)
Admission: AD | Admit: 2012-07-13 | Discharge: 2012-07-13 | Disposition: A | Discharge status: Home / Self Care | Payer: Medicaid Other | Source: Ambulatory Visit | Attending: Obstetrics & Gynecology | Admitting: Obstetrics & Gynecology

## 2012-07-13 DIAGNOSIS — O479 False labor, unspecified: Secondary | ICD-10-CM | POA: Insufficient documentation

## 2012-07-13 NOTE — MAU Provider Note (Signed)
History     CSN: [id number]  Arrival date and time: 07/13/12 0100   None     Chief Complaint  Patient presents with  . Labor Eval   HPI Jennifer Dunlap is a 32 y.o. [id number] female at [redacted]w[redacted]d who presents w/ report of uc's all day and sensation of baby shaking in utero x 3.  Reports somewhat decreased fm since that occurred.  Denies vb or lof.  Next appt in [facility] on Wed.  OB History   Grav Para Term Preterm Abortions TAB SAB Ect Mult Living   8 5 5  0 2 2 0 0 0 5      Past Medical History  Diagnosis Date  . Pericarditis 2011    Past Surgical History  Procedure Laterality Date  . No past surgeries      Family History  Problem Relation Age of Onset  . Heart disease Father   . Heart attack Father   . Cancer Sister 57    ovarian  . Stroke Father   . Brain cancer Maternal Grandmother   . Hypertension Mother   . Heart attack Paternal Grandmother     History  Substance Use Topics  . Smoking status: Never Smoker   . Smokeless tobacco: Never Used  . Alcohol Use: No    Allergies:  Allergies  Allergen Reactions  . Penicillins Anaphylaxis    Has not had reaction to Amoxicillin yet.    Prescriptions prior to admission  Medication Sig Dispense Refill  . acetaminophen (TYLENOL) 500 MG tablet Take 1,000 mg by mouth every 8 (eight) hours as needed. For pain      . cyclobenzaprine (FLEXERIL) 5 MG tablet Take 1 tablet (5 mg total) by mouth 3 (three) times daily as needed for muscle spasms.  30 tablet  0  . ondansetron (ZOFRAN) 4 MG tablet Take 1 tablet (4 mg total) by mouth every 8 (eight) hours as needed for nausea.  20 tablet  0    Review of Systems  Constitutional: Negative.  Negative for fever and chills.  HENT: Negative.   Eyes: Negative.   Respiratory: Negative.   Cardiovascular: Negative.   Gastrointestinal: Positive for abdominal pain (uc's).  Genitourinary: Negative.   Musculoskeletal: Negative.   Skin: Negative.   Neurological: Negative.    Endo/Heme/Allergies: Negative.   Psychiatric/Behavioral: Negative.    Physical Exam   Blood pressure 100/52, pulse 70, temperature 97.9 F (36.6 C), temperature source Oral, resp. rate 16, height 5\' 3"  (1.6 m), weight 78.926 kg (174 lb), last menstrual period 10/07/2011.  Physical Exam  Constitutional: She is oriented to person, place, and time. She appears well-developed and well-nourished.  HENT:  Head: Normocephalic.  Neck: Normal range of motion.  Cardiovascular: Normal rate.   Respiratory: Effort normal.  GI: Soft.  gravid  Genitourinary: Vagina normal and uterus normal.  SVE: 1.5/50/-2 to -3 by MAU RN and no change >1hr later by myself  Musculoskeletal: Normal range of motion.  Neurological: She is alert and oriented to person, place, and time. She has normal reflexes.  Skin: Skin is warm and dry.  Psychiatric: She has a normal mood and affect. Her behavior is normal. Judgment and thought content normal.   FHR: 115, mod variability, 15x15accels, no decels=Cat I  UCs: irregular, mild uc's  MAU Course  Procedures  NST SVE x 2  Reviewed pt reports w/ Muhammed, CNM who states no further testing needed Assessment and Plan  A:  [redacted]w[redacted]d [facility]  [id number]   [id number]  hicks vs. Early labor  Fetal shaking sensation x3- unknown etiology/significance, none since arrival to MAU  Cat I FHR, reactive NST   P:  D/C home  Keep appt as scheduled on Wed at [facility]  Reviewed labor s/s and fetal kick counts  Reviewed reasons to return to hosp  Marge Duncans 07/13/2012, 3:28 AM

## 2012-07-13 NOTE — MAU Provider Note (Signed)
Attestation of Attending Supervision of Advanced Practitioner (CNM/NP): Evaluation and management procedures were performed by the Advanced Practitioner under my supervision and collaboration.  I have reviewed the Advanced Practitioner's note and chart, and I agree with the management and plan.  HARRAWAY-SMITH, Darcella Shiffman 7:52 AM

## 2012-07-13 NOTE — MAU Note (Signed)
Pt states she has been contracting all day-states that around 0030 she felt the baby "shaking" inside her and she wants to make sure the baby is OK

## 2012-07-15 ENCOUNTER — Inpatient Hospital Stay (HOSPITAL_COMMUNITY): Payer: Medicaid Other | Admitting: Anesthesiology

## 2012-07-15 ENCOUNTER — Encounter (HOSPITAL_COMMUNITY): Payer: Self-pay | Admitting: Anesthesiology

## 2012-07-15 ENCOUNTER — Inpatient Hospital Stay (HOSPITAL_COMMUNITY)
Admission: AD | Admit: 2012-07-15 | Discharge: 2012-07-17 | DRG: 775 | Disposition: A | Discharge status: Home / Self Care | Payer: Medicaid Other | Source: Ambulatory Visit | Attending: Obstetrics & Gynecology | Admitting: Obstetrics & Gynecology

## 2012-07-15 ENCOUNTER — Encounter (HOSPITAL_COMMUNITY): Payer: Self-pay

## 2012-07-15 DIAGNOSIS — Z3483 Encounter for supervision of other normal pregnancy, third trimester: Secondary | ICD-10-CM

## 2012-07-15 DIAGNOSIS — O4292 Full-term premature rupture of membranes, unspecified as to length of time between rupture and onset of labor: Secondary | ICD-10-CM

## 2012-07-15 LAB — CBC
HCT: 33.9 % — ABNORMAL LOW (ref 36.0–46.0)
Hemoglobin: 11.3 g/dL — ABNORMAL LOW (ref 12.0–15.0)
RDW: 14.2 % (ref 11.5–15.5)
WBC: 7.8 10*3/uL (ref 4.0–10.5)

## 2012-07-15 LAB — RPR: RPR Ser Ql: NONREACTIVE

## 2012-07-15 LAB — TYPE AND SCREEN: Antibody Screen: NEGATIVE

## 2012-07-15 MED ORDER — BENZOCAINE-MENTHOL 20-0.5 % EX AERO
1.0000 "application " | INHALATION_SPRAY | CUTANEOUS | Status: DC | PRN
  Administered 2012-07-15: 1 via TOPICAL
  Filled 2012-07-15: qty 56

## 2012-07-15 MED ORDER — EPHEDRINE 5 MG/ML INJ
10.0000 mg | INTRAVENOUS | Status: DC | PRN
  Filled 2012-07-15: qty 2

## 2012-07-15 MED ORDER — TETANUS-DIPHTH-ACELL PERTUSSIS 5-2.5-18.5 LF-MCG/0.5 IM SUSP
0.5000 mL | Freq: Once | INTRAMUSCULAR | Status: AC
  Administered 2012-07-16: 0.5 mL via INTRAMUSCULAR
  Filled 2012-07-15 (×2): qty 0.5

## 2012-07-15 MED ORDER — SENNOSIDES-DOCUSATE SODIUM 8.6-50 MG PO TABS
2.0000 | ORAL_TABLET | Freq: Every day | ORAL | Status: DC
  Administered 2012-07-15 – 2012-07-16 (×2): 2 via ORAL

## 2012-07-15 MED ORDER — ACETAMINOPHEN 325 MG PO TABS: 650.0000 mg | ORAL_TABLET | ORAL | Status: DC | PRN

## 2012-07-15 MED ORDER — IBUPROFEN 600 MG PO TABS: 600.0000 mg | ORAL_TABLET | Freq: Four times a day (QID) | ORAL | Status: DC | PRN

## 2012-07-15 MED ORDER — CITRIC ACID-SODIUM CITRATE 334-500 MG/5ML PO SOLN: 30.0000 mL | ORAL | Status: DC | PRN

## 2012-07-15 MED ORDER — FENTANYL 2.5 MCG/ML BUPIVACAINE 1/10 % EPIDURAL INFUSION (WH - ANES)
14.0000 mL/h | INTRAMUSCULAR | Status: DC | PRN
  Administered 2012-07-15: 14 mL/h via EPIDURAL
  Filled 2012-07-15: qty 125

## 2012-07-15 MED ORDER — DIPHENHYDRAMINE HCL 25 MG PO CAPS: 25.0000 mg | ORAL_CAPSULE | Freq: Four times a day (QID) | ORAL | Status: DC | PRN

## 2012-07-15 MED ORDER — ONDANSETRON HCL 4 MG/2ML IJ SOLN: 4.0000 mg | Freq: Four times a day (QID) | INTRAMUSCULAR | Status: DC | PRN

## 2012-07-15 MED ORDER — DIPHENHYDRAMINE HCL 50 MG/ML IJ SOLN: 12.5000 mg | INTRAMUSCULAR | Status: DC | PRN

## 2012-07-15 MED ORDER — PRENATAL MULTIVITAMIN CH
1.0000 | ORAL_TABLET | Freq: Every day | ORAL | Status: DC
  Filled 2012-07-15 (×2): qty 1

## 2012-07-15 MED ORDER — ONDANSETRON HCL 4 MG PO TABS: 4.0000 mg | ORAL_TABLET | ORAL | Status: DC | PRN

## 2012-07-15 MED ORDER — EPHEDRINE 5 MG/ML INJ
10.0000 mg | INTRAVENOUS | Status: DC | PRN
  Filled 2012-07-15: qty 4
  Filled 2012-07-15: qty 2

## 2012-07-15 MED ORDER — LACTATED RINGERS IV SOLN
500.0000 mL | Freq: Once | INTRAVENOUS | Status: AC
  Administered 2012-07-15: 500 mL via INTRAVENOUS

## 2012-07-15 MED ORDER — IBUPROFEN 600 MG PO TABS
600.0000 mg | ORAL_TABLET | Freq: Four times a day (QID) | ORAL | Status: DC
  Administered 2012-07-15 – 2012-07-17 (×9): 600 mg via ORAL
  Filled 2012-07-15 (×9): qty 1

## 2012-07-15 MED ORDER — PHENYLEPHRINE 40 MCG/ML (10ML) SYRINGE FOR IV PUSH (FOR BLOOD PRESSURE SUPPORT)
80.0000 ug | PREFILLED_SYRINGE | INTRAVENOUS | Status: DC | PRN
  Filled 2012-07-15: qty 2
  Filled 2012-07-15: qty 5

## 2012-07-15 MED ORDER — WITCH HAZEL-GLYCERIN EX PADS: 1.0000 "application " | MEDICATED_PAD | CUTANEOUS | Status: DC | PRN

## 2012-07-15 MED ORDER — LACTATED RINGERS IV SOLN
INTRAVENOUS | Status: DC
  Administered 2012-07-15 (×2): via INTRAVENOUS

## 2012-07-15 MED ORDER — FENTANYL 2.5 MCG/ML BUPIVACAINE 1/10 % EPIDURAL INFUSION (WH - ANES)
INTRAMUSCULAR | Status: DC | PRN
  Administered 2012-07-15: 14 mL/h via EPIDURAL

## 2012-07-15 MED ORDER — OXYCODONE-ACETAMINOPHEN 5-325 MG PO TABS
1.0000 | ORAL_TABLET | ORAL | Status: DC | PRN
  Administered 2012-07-17: 1 via ORAL
  Filled 2012-07-15: qty 1
  Filled 2012-07-15: qty 2
  Filled 2012-07-15 (×4): qty 1

## 2012-07-15 MED ORDER — OXYTOCIN BOLUS FROM INFUSION
500.0000 mL | INTRAVENOUS | Status: DC
Start: 2012-07-15 — End: 2012-07-15

## 2012-07-15 MED ORDER — LIDOCAINE HCL (PF) 1 % IJ SOLN
30.0000 mL | INTRAMUSCULAR | Status: DC | PRN
  Filled 2012-07-15 (×2): qty 30

## 2012-07-15 MED ORDER — PHENYLEPHRINE 40 MCG/ML (10ML) SYRINGE FOR IV PUSH (FOR BLOOD PRESSURE SUPPORT)
80.0000 ug | PREFILLED_SYRINGE | INTRAVENOUS | Status: DC | PRN
  Filled 2012-07-15: qty 2

## 2012-07-15 MED ORDER — LACTATED RINGERS IV SOLN: 500.0000 mL | INTRAVENOUS | Status: DC | PRN

## 2012-07-15 MED ORDER — LIDOCAINE HCL (PF) 1 % IJ SOLN
INTRAMUSCULAR | Status: DC | PRN
  Administered 2012-07-15 (×2): 4 mL

## 2012-07-15 MED ORDER — NALBUPHINE SYRINGE 5 MG/0.5 ML
10.0000 mg | INJECTION | INTRAMUSCULAR | Status: DC | PRN
  Filled 2012-07-15: qty 1

## 2012-07-15 MED ORDER — LANOLIN HYDROUS EX OINT: TOPICAL_OINTMENT | CUTANEOUS | Status: DC | PRN

## 2012-07-15 MED ORDER — OXYTOCIN 40 UNITS IN LACTATED RINGERS INFUSION - SIMPLE MED
62.5000 mL/h | INTRAVENOUS | Status: DC
  Administered 2012-07-15: 999 mL/h via INTRAVENOUS
  Filled 2012-07-15: qty 1000

## 2012-07-15 MED ORDER — ONDANSETRON HCL 4 MG/2ML IJ SOLN: 4.0000 mg | INTRAMUSCULAR | Status: DC | PRN

## 2012-07-15 MED ORDER — OXYCODONE-ACETAMINOPHEN 5-325 MG PO TABS
1.0000 | ORAL_TABLET | ORAL | Status: DC | PRN
  Administered 2012-07-15 – 2012-07-16 (×6): 1 via ORAL
  Filled 2012-07-15: qty 1

## 2012-07-15 MED ORDER — SIMETHICONE 80 MG PO CHEW: 80.0000 mg | CHEWABLE_TABLET | ORAL | Status: DC | PRN

## 2012-07-15 MED ORDER — ZOLPIDEM TARTRATE 5 MG PO TABS: 5.0000 mg | ORAL_TABLET | Freq: Every evening | ORAL | Status: DC | PRN

## 2012-07-15 MED ORDER — DIBUCAINE 1 % RE OINT: 1.0000 "application " | TOPICAL_OINTMENT | RECTAL | Status: DC | PRN

## 2012-07-15 NOTE — Anesthesia Postprocedure Evaluation (Signed)
  Anesthesia Post-op Note  Patient: Jennifer Dunlap  Procedure(s) Performed: * No procedures listed *  Patient Location: PACU and Mother/Baby  Anesthesia Type:Epidural  Level of Consciousness: awake, alert , oriented and patient cooperative  Airway and Oxygen Therapy: Patient Spontanous Breathing  Post-op Pain: none  Post-op Assessment: Post-op Vital signs reviewed, Patient's Cardiovascular Status Stable and Respiratory Function Stable  Post-op Vital Signs: Reviewed and stable  Complications: No apparent anesthesia complications

## 2012-07-15 NOTE — H&P (Signed)
Attestation of Attending Supervision of Advanced Practitioner (CNM/NP): Evaluation and management procedures were performed by the Advanced Practitioner under my supervision and collaboration. I have reviewed the Advanced Practitioner's note and chart, and I agree with the management and plan.  LEGGETT,KELLY H. 5:58 AM

## 2012-07-15 NOTE — Anesthesia Preprocedure Evaluation (Addendum)
Anesthesia Evaluation  Patient identified by MRN, date of birth, ID band Patient awake    Reviewed: Allergy & Precautions, H&P , Patient's Chart, lab work & pertinent test results  Airway Mallampati: III TM Distance: >3 FB Neck ROM: Full    Dental no notable dental hx. (+) Teeth Intact   Pulmonary neg pulmonary ROS,  breath sounds clear to auscultation  Pulmonary exam normal       Cardiovascular negative cardio ROS  Rhythm:Regular Rate:Normal  Hx/o pericarditis 2011 Recent Echo unavailable   Neuro/Psych negative neurological ROS  negative psych ROS   GI/Hepatic negative GI ROS, Neg liver ROS,   Endo/Other  negative endocrine ROS  Renal/GU negative Renal ROS  negative genitourinary   Musculoskeletal negative musculoskeletal ROS (+)   Abdominal   Peds  Hematology negative hematology ROS (+)   Anesthesia Other Findings   Reproductive/Obstetrics Grand Multiparity                          Anesthesia Physical Anesthesia Plan  ASA: II  Anesthesia Plan: Epidural   Post-op Pain Management:    Induction:   Airway Management Planned: Natural Airway  Additional Equipment:   Intra-op Plan:   Post-operative Plan:   Informed Consent: I have reviewed the patients History and Physical, chart, labs and discussed the procedure including the risks, benefits and alternatives for the proposed anesthesia with the patient or authorized representative who has indicated his/her understanding and acceptance.     Plan Discussed with: Anesthesiologist  Anesthesia Plan Comments:         Anesthesia Quick Evaluation

## 2012-07-15 NOTE — MAU Note (Signed)
Pt states water broke in the car on the way to the hospital. States UC are every 2-3 minutes. Denies vaginal bleeding. States good FM

## 2012-07-15 NOTE — H&P (Signed)
Jennifer Dunlap is a 32 y.o. female [phone] at [redacted]w[redacted]d presenting for contractions starting 1 hour ago with SROM on way to hospital.   Normal pregnancy, seen at [facility]. All prior vaginal deliveries with no complications. 1 hour GTT 112, normal ultrasound and quad screen. Hx maternal pericarditis 2011, had echo on 07/01/12 that was normal.  Maternal Medical History:  Reason for admission: Rupture of membranes and contractions.   Contractions: Onset was less than 1 hour ago.   Frequency: regular.   Perceived severity is strong.    Fetal activity: Perceived fetal activity is normal.   Last perceived fetal movement was within the past hour.    Prenatal complications: no prenatal complications Prenatal Complications - Diabetes: none.    OB History   Grav Para Term Preterm Abortions TAB SAB Ect Mult Living   8 5 5  0 2 2 0 0 0 5     Past Medical History  Diagnosis Date  . Pericarditis 2011   Past Surgical History  Procedure Laterality Date  . No past surgeries     Family History: family history includes Brain cancer in her maternal grandmother; Cancer (age of onset: 48) in her sister; Heart attack in her father and paternal grandmother; Heart disease in her father; Hypertension in her mother; and Stroke in her father. Social History:  reports that she has never smoked. She has never used smokeless tobacco. She reports that she does not drink alcohol or use illicit drugs.   Prenatal Transfer Tool  Maternal Diabetes: No Genetic Screening: Normal Maternal Ultrasounds/Referrals: Normal Fetal Ultrasounds or other Referrals:  None Maternal Substance Abuse:  No Significant Maternal Medications:  None Significant Maternal Lab Results:  Lab values include: Group B Strep negative Other Comments:  None  Review of Systems  Constitutional: Negative.   Respiratory: Negative.   Cardiovascular: Negative.   Gastrointestinal: Positive for abdominal pain.   Neurological: Negative for headaches.    Dilation: 2 Effacement (%): 90 Station: -2 Exam by:: Rudi Coco RN Blood pressure 111/71, pulse 90, temperature 97.3 F (36.3 C), temperature source Oral, resp. rate 20, last menstrual period 10/07/2011, SpO2 100.00%. Maternal Exam:  Uterine Assessment: Contraction strength is firm.  Contraction frequency is regular.   Abdomen: Fetal presentation: vertex  Pelvis: adequate for delivery.      Fetal Exam Fetal Monitor Review: Mode: ultrasound.   Baseline rate: 120.  Variability: moderate (6-25 bpm).   Pattern: no accelerations and no decelerations.       Physical Exam  Constitutional: She is oriented to person, place, and time. She appears well-developed and well-nourished. No distress.  HENT:  Head: Normocephalic and atraumatic.  Eyes: Conjunctivae and EOM are normal.  Neck: Normal range of motion. Neck supple.  Cardiovascular: Normal rate.   Respiratory: Effort normal. No respiratory distress.  GI: There is no tenderness. There is no rebound and no guarding.  Musculoskeletal: Normal range of motion. She exhibits no edema and no tenderness.  Neurological: She is alert and oriented to person, place, and time.  Skin: Skin is warm and dry.  Psychiatric: She has a normal mood and affect.    Prenatal labs: ABO, Rh: O/POS/-- (01/08 1134) Antibody: NEG (01/08 1134) Rubella: 7.65 (01/08 1134) RPR: NON REAC (01/08 1134)  HBsAg: NEGATIVE (01/08 1134)  HIV: NON REACTIVE (01/08 1134)  GBS:     Assessment/Plan: 32 y.o. [id number] at [redacted]w[redacted]d presenting in active labor at term - GBS negative - Epidural desired - Anticipate SVD  Napoleon Form 07/15/2012, 3:43 AM

## 2012-07-15 NOTE — Anesthesia Procedure Notes (Signed)
Epidural Patient location during procedure: OB Start time: 07/15/2012 5:19 AM  Staffing Anesthesiologist: Meshilem Machuca A. Performed by: anesthesiologist   Preanesthetic Checklist Completed: patient identified, site marked, surgical consent, pre-op evaluation, timeout performed, IV checked, risks and benefits discussed and monitors and equipment checked  Epidural Patient position: sitting Prep: site prepped and draped and DuraPrep Patient monitoring: continuous pulse ox and blood pressure Approach: midline Injection technique: LOR air  Needle:  Needle type: Tuohy  Needle gauge: 17 G Needle length: 9 cm and 9 Needle insertion depth: 5 cm cm Catheter type: closed end flexible Catheter size: 19 Gauge Catheter at skin depth: 10 cm Test dose: negative and Other  Assessment Events: blood not aspirated, injection not painful, no injection resistance, negative IV test and no paresthesia  Additional Notes Patient identified. Risks and benefits discussed including failed block, incomplete  Pain control, post dural puncture headache, nerve damage, paralysis, blood pressure Changes, nausea, vomiting, reactions to medications-both toxic and allergic and post Partum back pain. All questions were answered. Patient expressed understanding and wished to proceed. Sterile technique was used throughout procedure. Epidural site was Dressed with sterile barrier dressing. No paresthesias, signs of intravascular injection Or signs of intrathecal spread were encountered.  Patient was more comfortable after the epidural was dosed. Please see RN's note for documentation of vital signs and FHR which are stable.

## 2012-07-16 ENCOUNTER — Other Ambulatory Visit: Payer: Medicaid Other

## 2012-07-16 NOTE — Progress Notes (Signed)
Post Partum Day 1; SVD at 8:14 am (6/3) Subjective: up ad lib, voiding, tolerating PO and + flatus Patient does report some cramping abdominal pain controlled with Ibuprofen & Percocet  Objective: Blood pressure 102/60, pulse 55, temperature 97.9 F (36.6 C), temperature source Oral, resp. rate 17, last menstrual period 10/07/2011, SpO2 100.00%, unknown if currently breastfeeding.  Physical Exam:  General: alert, cooperative and no distress Lochia: appropriate Uterine Fundus: firm DVT Evaluation: No evidence of DVT seen on physical exam. No cords or calf tenderness. No significant calf/ankle edema.   Recent Labs  07/15/12 0415  HGB 11.3*  HCT 33.9*    Assessment/Plan: Patient doing well, but not quite ready to go home yet (reports current pain may interfere with taking care of her children at home). Plan for discharge tomorrow   LOS: 1 day   [name] 07/16/2012, 7:59 AM

## 2012-07-16 NOTE — Progress Notes (Signed)
Agree with note  Arnaldo Heffron G Bridgette Wolden, MD . 

## 2012-07-16 NOTE — Progress Notes (Signed)
UR chart review completed.  

## 2012-07-17 MED ORDER — PRENATAL MULTIVITAMIN CH: 1.0000 | ORAL_TABLET | Freq: Every day | ORAL | Status: DC

## 2012-07-17 MED ORDER — IBUPROFEN 600 MG PO TABS: 600.0000 mg | ORAL_TABLET | Freq: Four times a day (QID) | ORAL | Status: DC

## 2012-07-17 NOTE — Lactation Note (Signed)
This note was copied from the chart of Jennifer Dunlap. Lactation Consultation Note  Patient Name: Jennifer Dunlap [id number] Date: 07/17/2012 Reason for consult: Follow-up assessment According to mother she has been seen and supported by lactation and nursing staff with breastfeeding and pumping. Baby asleep, recently fed so latch was not assessed. This is mother's 6th child and she reports breastfeeding all. Mother states she likes to pump in the beginning to get her milk started and feed to her baby EBM along with breastfeeding. Encouraged frequent feeding at the breast now that her milk is coming to volume. Mother was praised for her commitment to breastfeeding and encouraged to call [facility] for any concerns. Informed of the BFSG and outpatient support and services. Maternal Data Formula Feeding for Exclusion: Yes Reason for exclusion: Mother's choice to formula and breast feed on admission  Feeding Feeding Type: Breast Milk Feeding method: Bottle  LATCH Score/Interventions                      Lactation Tools Discussed/Used WIC Program: Yes Pump Review: Setup, frequency, and cleaning Date initiated:: 07/16/12   Consult Status Consult Status: Complete    Christella Hartigan M 07/17/2012, 10:49 AM

## 2012-07-17 NOTE — Discharge Summary (Signed)
Obstetric Discharge Summary Reason for Admission: onset of labor and rupture of membranes Prenatal Procedures: ultrasound Intrapartum Procedures: spontaneous vaginal delivery Postpartum Procedures: none Complications-Operative and Postpartum: none Hemoglobin  Date Value Range Status  07/15/2012 11.3* 12.0 - 15.0 g/dL Final     HCT  Date Value Range Status  07/15/2012 33.9* 36.0 - 46.0 % Final    Physical Exam:  General: alert, cooperative and no distress Lochia: appropriate Uterine Fundus: firm DVT Evaluation: No evidence of DVT seen on physical exam. No cords or calf tenderness. No significant calf/ankle edema.  Discharge Diagnoses: Term Pregnancy-delivered  Discharge Information: Date: 07/17/2012 Activity: pelvic rest Diet: routine Medications: PNV and Ibuprofen Condition: stable Instructions: refer to practice specific booklet Discharge to: home   Newborn Data: Live born female  Birth Weight: 8 lb 1.1 oz (3660 g) APGAR: 7, 9  Home with mother.  [name] 07/17/2012, 6:14 AM  Hospital Course: Admission Note:Jennifer Dunlap is a 32 y.o. female [phone] at [redacted]w[redacted]d presenting for contractions starting 1 hour ago with SROM on way to hospital.  Normal pregnancy, [facility]. All prior vaginal deliveries with no complications. 1 hour GTT 112, normal ultrasound and quad screen. Hx maternal pericarditis 2011, had echo on 07/01/12 that was normal.  Delivery Note  At 8:14 AM a viable female was delivered via Vaginal, Spontaneous Delivery (Presentation: Left Anterior). APGAR: 7, 9; weight 8 lb 1.1 oz (3660 g).  Placenta status: Intact, Spontaneous. Cord: 3 vessels with the following complications: None.  Anesthesia: Epidural  Episiotomy: None  Lacerations: None  Suture Repair: n/a  Est. Blood Loss (mL): 250 Has done well postpartum and is ready for discharge. Wants early postpartum visit at 4 weeks.   Seen also by me. Agree with above note [name] CNM

## 2012-08-13 ENCOUNTER — Encounter: Payer: Self-pay | Admitting: Advanced Practice Midwife

## 2012-08-13 ENCOUNTER — Ambulatory Visit (INDEPENDENT_AMBULATORY_CARE_PROVIDER_SITE_OTHER): Payer: Medicaid Other | Admitting: Advanced Practice Midwife

## 2012-08-13 VITALS — BP 117/74 | HR 60 | Temp 97.0°F | Resp 20 | Ht 63.75 in | Wt 151.1 lb

## 2012-08-13 DIAGNOSIS — Z30430 Encounter for insertion of intrauterine contraceptive device: Secondary | ICD-10-CM

## 2012-08-13 LAB — POCT PREGNANCY, URINE: Preg Test, Ur: NEGATIVE

## 2012-08-13 MED ORDER — LEVONORGESTREL 20 MCG/24HR IU IUD
INTRAUTERINE_SYSTEM | Freq: Once | INTRAUTERINE | Status: AC
  Administered 2012-08-13: 1 via INTRAUTERINE

## 2012-08-13 NOTE — Progress Notes (Signed)
Subjective:     Patient ID: Jennifer Dunlap, female   DOB: [birth year], 32 y.o.   MRN: [id number]  HPI   Review of Systems    Jennifer Dunlap is a 32 y.o. [id number] S/P NSVB PP x 4 weeks. Desires Mirena IUD for contraception. R/B/A discussed. Including discussing paragard IUD. No complaints at this time. Denies any depressive sx today.  Objective:   Physical Exam  Nursing note and vitals reviewed. Constitutional: She is oriented to person, place, and time. She appears well-developed and well-nourished. No distress.  Cardiovascular: Normal rate.   Pulmonary/Chest: Effort normal.  Abdominal: Soft.  Genitourinary:  External: no lesion Vagina: small amount of white discharge Cervix: pink, smooth, no CMT Uterus: NSSC Adnexa: NT   Musculoskeletal: Normal range of motion.  Neurological: She is alert and oriented to person, place, and time.  Skin: Skin is warm.      IUD placement: R/B/A discussed with the patient, and informed consent obtained. Cervix visualized with the speculum, cleansed with betadine and then grasped with tenaculum. Uterus sounded to a depth of 8 cm. IUD placed without difficulty, and patient tolerated the procedure well. After care instructions reviewed. FU in 1 month for check.   Assessment:     4 weeks S/P nsvb IUD for contraception     Plan:     FU in month for IUD check Routine GYN care

## 2012-08-13 NOTE — Progress Notes (Signed)
Pt states she had vaginal itching a week ago. She it still having lochia flow and wonders if this is normal.

## 2012-08-18 ENCOUNTER — Encounter: Payer: Self-pay | Admitting: *Deleted

## 2013-03-25 ENCOUNTER — Emergency Department (HOSPITAL_COMMUNITY)
Admission: EM | Admit: 2013-03-25 | Discharge: 2013-03-25 | Disposition: A | Discharge status: Home / Self Care | Payer: Medicaid Other | Attending: Emergency Medicine | Admitting: Emergency Medicine

## 2013-03-25 ENCOUNTER — Emergency Department (HOSPITAL_COMMUNITY): Payer: Medicaid Other

## 2013-03-25 ENCOUNTER — Encounter (HOSPITAL_COMMUNITY): Payer: Self-pay | Admitting: Emergency Medicine

## 2013-03-25 DIAGNOSIS — A499 Bacterial infection, unspecified: Secondary | ICD-10-CM | POA: Insufficient documentation

## 2013-03-25 DIAGNOSIS — R0602 Shortness of breath: Secondary | ICD-10-CM | POA: Insufficient documentation

## 2013-03-25 DIAGNOSIS — Z3202 Encounter for pregnancy test, result negative: Secondary | ICD-10-CM | POA: Insufficient documentation

## 2013-03-25 DIAGNOSIS — R071 Chest pain on breathing: Secondary | ICD-10-CM | POA: Insufficient documentation

## 2013-03-25 DIAGNOSIS — Z8679 Personal history of other diseases of the circulatory system: Secondary | ICD-10-CM | POA: Insufficient documentation

## 2013-03-25 DIAGNOSIS — N760 Acute vaginitis: Secondary | ICD-10-CM | POA: Insufficient documentation

## 2013-03-25 DIAGNOSIS — Z880 Allergy status to penicillin: Secondary | ICD-10-CM | POA: Insufficient documentation

## 2013-03-25 DIAGNOSIS — B9689 Other specified bacterial agents as the cause of diseases classified elsewhere: Secondary | ICD-10-CM | POA: Insufficient documentation

## 2013-03-25 DIAGNOSIS — M549 Dorsalgia, unspecified: Secondary | ICD-10-CM | POA: Insufficient documentation

## 2013-03-25 DIAGNOSIS — R0789 Other chest pain: Secondary | ICD-10-CM

## 2013-03-25 LAB — BASIC METABOLIC PANEL
BUN: 8 mg/dL (ref 6–23)
CO2: 22 mEq/L (ref 19–32)
CREATININE: 0.66 mg/dL (ref 0.50–1.10)
Calcium: 10 mg/dL (ref 8.4–10.5)
Chloride: 103 mEq/L (ref 96–112)
GFR calc non Af Amer: >90 mL/min (ref >90)
Glucose, Bld: 95 mg/dL (ref 70–99)
POTASSIUM: 4.1 meq/L (ref 3.7–5.3)
Sodium: 137 mEq/L (ref 137–147)

## 2013-03-25 LAB — POCT I-STAT TROPONIN I: TROPONIN I, POC: 0 ng/mL (ref 0.00–0.08)

## 2013-03-25 LAB — D-DIMER, QUANTITATIVE: D-Dimer, Quant: 0.38 ug/mL-FEU (ref 0.00–0.48)

## 2013-03-25 LAB — WET PREP, GENITAL: Trich, Wet Prep: NONE SEEN

## 2013-03-25 LAB — CBC
HEMATOCRIT: 39.7 % (ref 36.0–46.0)
Hemoglobin: 14 g/dL (ref 12.0–15.0)
MCH: 30 pg (ref 26.0–34.0)
MCHC: 35.3 g/dL (ref 30.0–36.0)
MCV: 85 fL (ref 78.0–100.0)
PLATELETS: 234 10*3/uL (ref 150–400)
RBC: 4.67 MIL/uL (ref 3.87–5.11)
RDW: 13.1 % (ref 11.5–15.5)
WBC: 6.3 10*3/uL (ref 4.0–10.5)

## 2013-03-25 LAB — POCT PREGNANCY, URINE: Preg Test, Ur: NEGATIVE

## 2013-03-25 MED ORDER — METRONIDAZOLE 500 MG PO TABS: 500.0000 mg | ORAL_TABLET | Freq: Two times a day (BID) | ORAL | Status: DC

## 2013-03-25 MED ORDER — ACETAMINOPHEN 325 MG PO TABS
650.0000 mg | ORAL_TABLET | Freq: Once | ORAL | Status: AC
  Administered 2013-03-25: 650 mg via ORAL
  Filled 2013-03-25: qty 2

## 2013-03-25 MED ORDER — IBUPROFEN 600 MG PO TABS: 600.0000 mg | ORAL_TABLET | Freq: Four times a day (QID) | ORAL | Status: DC | PRN

## 2013-03-25 MED ORDER — IBUPROFEN 800 MG PO TABS
800.0000 mg | ORAL_TABLET | Freq: Once | ORAL | Status: AC
  Administered 2013-03-25: 800 mg via ORAL
  Filled 2013-03-25: qty 1

## 2013-03-25 NOTE — ED Notes (Signed)
Pt. Reports left sided chest pain starting this AM. States chest pressure with sharp pain on inhalation. Radiation to left arm and back. Hx of pericarditis. Pt. Also requesting to be evaluated for low back pain/vaginal discharge.

## 2013-03-25 NOTE — ED Provider Notes (Signed)
Medical screening examination/treatment/procedure(s) were conducted as a shared visit with non-physician practitioner(s) and myself.  I personally evaluated the patient during the encounter.  EKG Interpretation   None       Date: 03/25/2013  Rate: 87  Rhythm: normal sinus rhythm  QRS Axis: normal  Intervals: normal  ST/T Wave abnormalities: normal  Conduction Disutrbances:none  Narrative Interpretation: Low voltage QRS  Old EKG Reviewed: none available  Patient here with persistent left-sided chest pain x1 day that's worse with movement as well as lying flat and somewhat better with sitting up. No leg pain or swelling. No fever chills or cough. History of pericarditis and this is similar. On exam she is tender along the left anterior chest wall. Her EKG does not show any signs of pericarditis. However, will order a d-dimer and if positive will do chest CT if negative patient to be treated with anti-inflammatories.    Jennifer Jacobsen, MD 03/25/13 [phone]

## 2013-03-25 NOTE — ED Notes (Signed)
Per pt sts chest pain that started earlier today sts pressure. sts hx of pericarditis but this feels different. sts back also in upper back area and hurts when she takes a deep breath. sts also some lower back pain. Denies urinary symptoms.

## 2013-03-25 NOTE — Discharge Instructions (Signed)
Follow up with a primary care provider in 1 week for reevaluation. Resource guide provided below for follow up reference. Take medications as directed. Return to the ED should you develop any worsening Chest pain or shortness of breath.    Emergency Department Resource Guide 1) Find a Doctor and Pay Out of Pocket Although you won't have to find out who is covered by your insurance plan, it is a good idea to ask around and get recommendations. You will then need to call the office and see if the doctor you have chosen will accept you as a new patient and what types of options they offer for patients who are self-pay. Some doctors offer discounts or will set up payment plans for their patients who do not have insurance, but you will need to ask so you aren't surprised when you get to your appointment.  2) Contact Your Local Health Department Not all health departments have doctors that can see patients for sick visits, but many do, so it is worth a call to see if yours does. If you don't know where your local health department is, you can check in your phone book. The CDC also has a tool to help you locate your state's health department, and many state websites also have listings of all of their local health departments.  3) Find a [facility] If your illness is not likely to be very severe or complicated, you may want to try a walk in clinic. These are popping up all over the country in pharmacies, drugstores, and shopping centers. They're usually staffed by nurse practitioners or physician assistants that have been trained to treat common illnesses and complaints. They're usually fairly quick and inexpensive. However, if you have serious medical issues or chronic medical problems, these are probably not your best option.  No Primary Care Doctor: - Call Health Connect at  [phone] - they can help you locate a primary care doctor that  accepts your insurance, provides certain services,  etc. - Physician Referral Service- [phone]  Chronic Pain Problems: Organization         Address  Phone   Notes  [facility]  [phone] Patients need to be referred by their primary care doctor.   Medication Assistance: Organization         Address  Phone   Notes  [facility] Medication [facility] [address] [place], [place] [address] [phone] --Must be a resident of [facility] -- Must have NO insurance coverage whatsoever (no Medicaid/ Medicare, etc.) -- The pt. MUST have a primary care doctor that directs their care regularly and follows them in the community   MedAssist  [phone]   [employer]  [phone]    Agencies that provide inexpensive medical care: Organization         Address  Phone   Notes  [place]  [phone]   [facility]    [phone]   [facility] [place], [place] [address] [phone]   [place] [address] [place] [phone]   Planned Parenthood    [phone]   [facility]    [phone]   [place] and [place] [address], [place] Phone:  [phone], Fax:  [phone] Hours of Operation:  9 am - 6 pm, M-F.  Also accepts Medicaid/Medicare and  self-pay.  [facility] for [place], [address], [place] Phone: [phone], Fax: [phone]. Hours of Operation:  8:30 am - 5:30 pm, M-F.  Also accepts Medicaid and self-pay.  [facility] [place] [address] [place] Phone: [phone]   [place], [place], [place] [phone], Ext. [phone] Mondays & Thursdays: 7-9 AM.  First 15 patients are seen on a first come, first serve basis.    [place] Providers:  Organization         Address  Phone   Notes  [facility] [address], [place] [phone] Also accepts self-pay patients.  [facility] [id number] [place], [place]  [phone]   [place], [address], [place] [phone]   [facility] [address], [place] [phone]   Lucianne Lei [address], [place]   [phone] Only accepts [place] Access [place] patients after they have their name applied to their card.   Self-Pay (no insurance) in [facility]:  Organization         Address  Phone   Notes  Sickle Cell Patients, [facility] [place] [phone]   [facility] [place] [phone]   [facility] [place]  [place], [place], [place] [phone]   [facility]/Dr. Osei-Bonsu  [address], [place] or [address], [place] [phone] Phone number for both [place] and [place] locations is the same.  Urgent Medical and [facility] [place] [address] [place] [phone]   [facility] [address], [place] or [address] [phone]   [facility] [address] [place] [phone], phone; [phone], fax Sees patients 1st and 3rd Saturday of every month.  Must not qualify for public or private insurance (i.e. Medicaid, Medicare, [place] Health Choice, Veterans' Benefits)  Household income should be no more than 200% of the poverty level The clinic cannot treat you if you are pregnant or think you are pregnant  Sexually transmitted diseases are not treated at the clinic.    Dental Care: Organization         Address  Phone  Notes  [facility] Department of [facility] [place] [phone] Accepts children up to  age 32 who are enrolled in [place] or [place]; pregnant women with a Medicaid card; and children who have applied for Medicaid or [place] Health Choice, but were declined, whose parents can pay a reduced fee at time of service.  [facility] Department of [facility]  [address], [place] [phone] Accepts children up to age 69 who are enrolled in [place] or [place]; pregnant women with a Medicaid card; and children who have applied for Medicaid or  Health Choice, but were declined, whose parents can pay a reduced fee at time of service.  [facility] Adult Dental Access PROGRAM  [place] [phone] Patients are seen by appointment only. Walk-ins are not accepted. [place] will see patients 72 years of age and older. Monday - Tuesday (8am-5pm) Most Wednesdays (8:30-5pm) $  30 per visit, cash only  [facility] Adult [employer] PROGRAM  [address], [facility] [phone] Patients are seen by appointment only. Walk-ins are not accepted. [place] will see patients 41 years of age and older. One Wednesday Evening (Monthly: Volunteer Based).  $30 per visit, cash only  [place]  [phone] for adults; Children under age 58, call Graduate Pediatric Dentistry at [phone]. Children aged 52-14, please call [phone] to request a pediatric application.  Dental services are provided in all areas of dental care including fillings, crowns and bridges, complete and partial dentures, implants, gum treatment, root canals, and extractions. Preventive care is also provided. Treatment is provided to both adults and children. Patients are selected via a lottery and there is often a waiting list.   [facility] [address], [place]  [phone] [email or web address]   [facility] [address] [place], [place] [phone], Ext. [phone] Second and Fourth Thursday of  each month, opens at 6:30 AM; Clinic ends at 9 AM.  Patients are seen on a first-come first-served basis, and a limited number are seen during each clinic.   [facility]  [address] Hillard Danker [place], [place] [phone]   Eligibility Requirements You must have lived in [place], [place], or [place] counties for at least the last three months.   You cannot be eligible for state or federal sponsored [employer], including [employer], [place], or [employer].   You generally cannot be eligible for healthcare insurance through your employer.    How to apply: Eligibility screenings are held every Tuesday and Wednesday afternoon from 1:00 pm until 4:00 pm. You do not need an appointment for the interview!  [facility] [address] [place], [place]   [place]  [place] Department  [place]  [phone]    Behavioral Health Resources in the Community: Intensive Outpatient Programs Organization         Address  Phone  Notes  [place]. [address], [place], [place] [phone]   [facility] [address] [place], [place]   ADS: Alcohol & Drug Svcs [address] [place], [place]   [place] [address],  [place], [place] or [phone]   Substance Abuse Resources Organization         Address  Phone  Notes  Alcohol and Drug Services  [phone]   [place]  [phone]   [place]   Chinita Pester  [phone]   Residential & Outpatient Substance Abuse Program  [phone]   Psychological Services Organization         Address  Phone  Notes  [facility] [place]  [place]  [phone]   [place] [address]  [place] or [phone]    Mobile Crisis Teams Organization         Address  Phone  Notes  [facility], Mobile Crisis Care Unit  [phone]   [facility]ive Psychotherapeutic Services  [address] [place], [place]   [place] Levels [address] [place] [phone]    Self-Help/Support Groups Organization         Address  Phone  Notes  Mental Health Assoc. of [place] - variety of support groups  [place] Call for more information  Narcotics Anonymous (NA), Caring Services [address], [employer] [place]  2 meetings at this location   Special educational needs teacher         Address  Phone  Notes  ASAP Residential Treatment [place],    [place]  [phone]   [facility]  [address], [place] [phone], [place], [place]   [place], [place] [phone] Admissions: 8am-3pm M-F  Incentives Substance [place] [address],    [place], [place] [phone]   [facility] [address] [place], [place], [place]   [facility] [address]  [place], [place]   [facility] Programs - Intensive Outpatient [address], [place], [place]   [facility] ([place].) [place].,  [place], [place] [phone] or [phone]   [facility]idential Treatment Services (RTS) [address] [place], [place] Accepts Medicaid  [facility] [place] [address],  [place] [phone] Substance Abuse/Addiction Treatment   [facility] Organization         Address  Phone  Notes  [facility]  [phone]   Domenic Schwab, PhD [address] Arlis Porta [place], [place]   [phone] or [phone]   [place], [place] [phone]     [facility] [address], [place], [place] [phone] Insurance/Medicaid/sponsorship through [facility] and Families [address], [address]                                    [place], [place] [phone] [place] [address], [place] [phone]    Dr. Adele Schilder  [phone]   [facility] Dept. 1) [address], [place] 2) [place] 3)  [place] [address], [place] [phone]  [phone]   [place] [phone] or [phone] (After Hours)

## 2013-03-25 NOTE — ED Provider Notes (Signed)
CSN: [id number]     Arrival date & time 03/25/13  1559 History   First MD Initiated Contact with Patient 03/25/13 1906     Chief Complaint  Patient presents with  . Chest Pain     (Consider location/radiation/quality/duration/timing/severity/associated sxs/prior Treatment) Patient is a 33 y.o. female presenting with chest pain.  Chest Pain Associated symptoms: back pain    33 yo female presents with Chest pain that started this morning at 10 am while she was cleaning the house. Patient describe sharp, pressure like pain that occurs "only when I inhale" with intermittent sharp pains that radiate to her left arm. Admits to shortness of breath with activity.  Pain worse with lying down and improved with sitting up. Denies N/V, diaphoresis, dizziness, numbness or tingling. PMH significant for postpartum Pericarditis . Patient recently had child on 07/15/12.   Advanced age > 52 yo: No HTN: No Hyperlipidemia: No Cigarette smoking: No Diabetes Mellitus: No Family hx of CAD or MI < 40 yo : Yes Female or Post menopausal: No Cocaine use: No Prior MI: No CABG: No Stress test: Yes  HR > 100 bpm: No O2 sat on RA < 95%: No Prior hx of venous thromboembolism:No Trauma or surgery in past 4 wks:No Hemoptysis:No Exogenous Estrogen use:Yes Unilateral Leg swelling: No  Pre tests probability for PE < 15%:No  Patient also admits to recent vaginal discharge with foul odor. Patient requests to make sure she doesn't have any infection.       Past Medical History  Diagnosis Date  . Pericarditis 2011   Past Surgical History  Procedure Laterality Date  . No past surgeries     Family History  Problem Relation Age of Onset  . Heart disease Father   . Heart attack Father   . Cancer Sister 69    ovarian  . Stroke Father   . Brain cancer Maternal Grandmother   . Hypertension Mother   . Heart attack Paternal Grandmother    History  Substance Use Topics  . Smoking status: Never Smoker   .  Smokeless tobacco: Never Used  . Alcohol Use: No   OB History   Grav Para Term Preterm Abortions TAB SAB Ect Mult Living   8 6 6  0 2 2 0 0 0 6     Review of Systems  Cardiovascular: Positive for chest pain.  Genitourinary: Positive for vaginal discharge.  Musculoskeletal: Positive for back pain.  All other systems reviewed and are negative.      Allergies  Penicillins  Home Medications   Current Outpatient Rx  Name  Route  Sig  Dispense  Refill  . ibuprofen (ADVIL,MOTRIN) 600 MG tablet   Oral   Take 1 tablet (600 mg total) by mouth every 6 (six) hours as needed.   30 tablet   0   . metroNIDAZOLE (FLAGYL) 500 MG tablet   Oral   Take 1 tablet (500 mg total) by mouth 2 (two) times daily. One po bid x 7 days   14 tablet   0    BP 113/67  Pulse 61  Temp(Src) 97.8 F (36.6 C) (Oral)  Resp 16  SpO2 99%  LMP 03/11/2013 Physical Exam  Nursing note and vitals reviewed. Constitutional: She is oriented to person, place, and time. She appears well-developed and well-nourished. No distress.  HENT:  Head: Normocephalic and atraumatic.  Mouth/Throat: Uvula is midline and mucous membranes are normal.  Eyes: Conjunctivae and EOM are normal.  Neck: Trachea normal and  phonation normal. Neck supple. No JVD present. No tracheal deviation present.  Cardiovascular: Normal rate, regular rhythm and normal heart sounds.  Exam reveals no gallop and no friction rub.   No murmur heard. Pulmonary/Chest: Effort normal and breath sounds normal. No respiratory distress. She has no wheezes. She has no rhonchi. She has no rales. She exhibits tenderness.  Abdominal: Soft. Bowel sounds are normal. There is no tenderness.  Genitourinary: Pelvic exam was performed with patient supine. There is no rash, tenderness, lesion or injury on the right labia. There is no rash, tenderness, lesion or injury on the left labia. Uterus is not tender. Cervix exhibits no motion tenderness, no discharge and no  friability. Right adnexum displays no mass, no tenderness and no fullness. Left adnexum displays no mass, no tenderness and no fullness. No erythema, tenderness or bleeding around the vagina. No foreign body around the vagina. No signs of injury around the vagina. Vaginal discharge found.  Musculoskeletal: Normal range of motion. She exhibits no edema.  Neurological: She is alert and oriented to person, place, and time.  Skin: Skin is warm and dry. She is not diaphoretic.  Psychiatric: She has a normal mood and affect. Her behavior is normal.    ED Course  Procedures (including critical care time) Labs Review Labs Reviewed  WET PREP, GENITAL - Abnormal; Notable for the following:    Yeast Wet Prep HPF POC RARE YEAST (*)    Clue Cells Wet Prep HPF POC MANY (*)    WBC, Wet Prep HPF POC FEW (*)    All other components within normal limits  GC/CHLAMYDIA PROBE AMP  CBC  BASIC METABOLIC PANEL  D-DIMER, QUANTITATIVE  POCT I-STAT TROPONIN I  POCT PREGNANCY, URINE   Imaging Review Dg Chest 2 View  03/25/2013   CLINICAL DATA:  Chest pain.  EXAM: CHEST  2 VIEW  COMPARISON:  10/07/2011  FINDINGS: Lungs are somewhat hypoinflated but otherwise clear. Cardiomediastinal silhouette and remainder of the exam is unchanged.  IMPRESSION: No active cardiopulmonary disease.   Electronically Signed   By: Marin Olp M.D.   On: 03/25/2013 16:42    EKG Interpretation   None       Date: 03/25/2013  Rate:87  Rhythm: normal sinus rhythm  QRS Axis: normal  Intervals: normal  ST/T Wave abnormalities: normal  Conduction Disutrbances:none  Narrative Interpretation:   Old EKG Reviewed: no significant changes noted   MDM   Final diagnoses:  Chest wall pain  BV (bacterial vaginosis)   Patient afebrile, with normal VS.  O2 sat stable at 98%-99% on RA EKG shows no ischemic changes.  Troponin negative  CXR negative  D-Dimer negative. Doubt PE, patient low risk. CBC -WNL BMP - WNL Wet prep  indicates BV. Will treat patient with oral Flagyl.  Chest pain reproducible on exam, plan to treat patient with NSAIDs and have her follow up with a PCP in 1 week.  Discussed results with patient. Patient agrees with plan. Discharged in good condition.   Meds given in ED:  Medications  acetaminophen (TYLENOL) tablet 650 mg (650 mg Oral Given 03/25/13 1946)  ibuprofen (ADVIL,MOTRIN) tablet 800 mg (800 mg Oral Given 03/25/13 2136)    Discharge Medication List as of 03/25/2013  9:49 PM    START taking these medications   Details  ibuprofen (ADVIL,MOTRIN) 600 MG tablet Take 1 tablet (600 mg total) by mouth every 6 (six) hours as needed., Starting 03/25/2013, Until Discontinued, Print    metroNIDAZOLE (FLAGYL) 500  MG tablet Take 1 tablet (500 mg total) by mouth 2 (two) times daily. One po bid x 7 days, Starting 03/25/2013, Until Discontinued, Print              Sherrie George, [place] 03/26/13 1049

## 2013-03-26 LAB — GC/CHLAMYDIA PROBE AMP
CT PROBE, AMP APTIMA: NEGATIVE
GC Probe RNA: NEGATIVE

## 2013-03-27 NOTE — ED Provider Notes (Signed)
Medical screening examination/treatment/procedure(s) were conducted as a shared visit with non-physician practitioner(s) and myself.  I personally evaluated the patient during the encounter.  EKG Interpretation    Date/Time:  Wednesday March 25 2013 16:09:18 EST Ventricular Rate:  87 PR Interval:  150 QRS Duration: 68 QT Interval:  332 QTC Calculation: 399 R Axis:   61 Text Interpretation:  Normal sinus rhythm Low voltage QRS Borderline ECG ED PHYSICIAN INTERPRETATION AVAILABLE IN [facility] Confirmed by TEST, RECORD ([id number]) on 03/27/2013 8:59:57 AM             Leota Jacobsen, MD 03/27/13 2016

## 2013-04-03 ENCOUNTER — Ambulatory Visit: Payer: Medicaid Other | Admitting: Family Medicine

## 2013-07-13 ENCOUNTER — Encounter: Payer: Self-pay | Admitting: Obstetrics and Gynecology

## 2013-07-13 ENCOUNTER — Ambulatory Visit (INDEPENDENT_AMBULATORY_CARE_PROVIDER_SITE_OTHER): Payer: Self-pay | Admitting: Obstetrics and Gynecology

## 2013-07-13 VITALS — BP 117/75 | HR 64 | Ht 63.0 in | Wt 147.3 lb

## 2013-07-13 DIAGNOSIS — N760 Acute vaginitis: Secondary | ICD-10-CM

## 2013-07-13 NOTE — Progress Notes (Signed)
Patient ID: Jennifer Dunlap, female   DOB: 1980/06/20, 33 y.o.   MRN: [id number] 33 yo [id number] s/p Mirena IUD placement in 08/2012 presenting today for IUD check. Patient reports 2 episodes of BV in the past few months and is concerned that it may be related to her IUD  Pelvic: IUD strings visualized at os extending approx 2 cm from os. No abnormal discharge or bleeding  A/P Normal IUD placement - Wet prep collected - Discussed good pelvic hygiene (avoid excessive washing, change out of sweaty gym clothes) and healthier diet - RTC for annual exam

## 2013-07-13 NOTE — Progress Notes (Signed)
Pt reports vaginal irritation and infections that she relates to her iud. Would like to have it checked.

## 2013-07-14 ENCOUNTER — Telehealth: Payer: Self-pay | Admitting: *Deleted

## 2013-07-14 LAB — WET PREP, GENITAL
TRICH WET PREP: NONE SEEN
WBC WET PREP: NONE SEEN
YEAST WET PREP: NONE SEEN

## 2013-07-14 MED ORDER — METRONIDAZOLE 500 MG PO TABS: 500.0000 mg | ORAL_TABLET | Freq: Two times a day (BID) | ORAL | Status: DC

## 2013-07-14 NOTE — Addendum Note (Signed)
Addended by: Mora Bellman on: 07/14/2013 09:32 AM   Modules accepted: Orders

## 2013-07-14 NOTE — Telephone Encounter (Signed)
Attempted to call patient, no answer, left message that the provider ordered her a medication and she could call and speak to a nurse if needed.

## 2013-07-14 NOTE — Telephone Encounter (Signed)
Message copied by Sue Lush on Tue Jul 14, 2013 11:08 AM ------      Message from: Mora Bellman      Created: Tue Jul 14, 2013  9:32 AM       Please inform patient of positive BV. Rx e-prescribed            Thanks            Peggy ------

## 2013-07-16 NOTE — Telephone Encounter (Signed)
Pt called nurse line and left a message for return call.    Attempted to call patient, mailbox is full unable to leave a message.

## 2013-07-21 NOTE — Telephone Encounter (Signed)
Attempted to call patient. No answer. Left message stating we are returning your call, a medication was sent to your pharmacy but please call clinic so that we may inform you of reason.

## 2013-07-22 ENCOUNTER — Encounter: Payer: Self-pay | Admitting: General Practice

## 2013-07-22 NOTE — Telephone Encounter (Signed)
Called patient and a woman answered stating she wasn't in right now. Told her to tell Malajah to give her doctors office a call back. Woman stated that she would. Will send letter

## 2013-10-13 ENCOUNTER — Inpatient Hospital Stay (HOSPITAL_COMMUNITY)
Admission: AD | Admit: 2013-10-13 | Discharge: 2013-10-13 | Disposition: A | Discharge status: Home / Self Care | Payer: Medicaid Other | Source: Ambulatory Visit | Attending: Family Medicine | Admitting: Family Medicine

## 2013-10-13 ENCOUNTER — Inpatient Hospital Stay (HOSPITAL_COMMUNITY): Payer: Medicaid Other

## 2013-10-13 ENCOUNTER — Encounter (HOSPITAL_COMMUNITY): Payer: Self-pay | Admitting: *Deleted

## 2013-10-13 DIAGNOSIS — Z30432 Encounter for removal of intrauterine contraceptive device: Secondary | ICD-10-CM | POA: Diagnosis not present

## 2013-10-13 DIAGNOSIS — N3000 Acute cystitis without hematuria: Secondary | ICD-10-CM | POA: Insufficient documentation

## 2013-10-13 DIAGNOSIS — R109 Unspecified abdominal pain: Secondary | ICD-10-CM | POA: Insufficient documentation

## 2013-10-13 DIAGNOSIS — N3090 Cystitis, unspecified without hematuria: Secondary | ICD-10-CM

## 2013-10-13 DIAGNOSIS — T8389XA Other specified complication of genitourinary prosthetic devices, implants and grafts, initial encounter: Secondary | ICD-10-CM

## 2013-10-13 DIAGNOSIS — Z30431 Encounter for routine checking of intrauterine contraceptive device: Secondary | ICD-10-CM

## 2013-10-13 LAB — URINALYSIS, ROUTINE W REFLEX MICROSCOPIC
Bilirubin Urine: NEGATIVE
Glucose, UA: NEGATIVE mg/dL
KETONES UR: NEGATIVE mg/dL
LEUKOCYTES UA: NEGATIVE
Nitrite: POSITIVE — AB
PROTEIN: NEGATIVE mg/dL
Specific Gravity, Urine: 1.02 (ref 1.005–1.030)
UROBILINOGEN UA: 1 mg/dL (ref 0.0–1.0)
pH: 6 (ref 5.0–8.0)

## 2013-10-13 LAB — WET PREP, GENITAL
Trich, Wet Prep: NONE SEEN
Yeast Wet Prep HPF POC: NONE SEEN

## 2013-10-13 LAB — URINE MICROSCOPIC-ADD ON

## 2013-10-13 LAB — CBC
HEMATOCRIT: 37 % (ref 36.0–46.0)
Hemoglobin: 12.9 g/dL (ref 12.0–15.0)
MCH: 30.6 pg (ref 26.0–34.0)
MCHC: 34.9 g/dL (ref 30.0–36.0)
MCV: 87.7 fL (ref 78.0–100.0)
Platelets: 199 10*3/uL (ref 150–400)
RBC: 4.22 MIL/uL (ref 3.87–5.11)
RDW: 12.5 % (ref 11.5–15.5)
WBC: 5.4 10*3/uL (ref 4.0–10.5)

## 2013-10-13 LAB — POCT PREGNANCY, URINE: PREG TEST UR: NEGATIVE

## 2013-10-13 MED ORDER — IBUPROFEN 600 MG PO TABS: 600.0000 mg | ORAL_TABLET | Freq: Four times a day (QID) | ORAL | Status: DC | PRN

## 2013-10-13 MED ORDER — SULFAMETHOXAZOLE-TMP DS 800-160 MG PO TABS: 1.0000 | ORAL_TABLET | Freq: Two times a day (BID) | ORAL | Status: DC

## 2013-10-13 NOTE — Discharge Instructions (Signed)
Urinary Tract Infection °Urinary tract infections (UTIs) can develop anywhere along your urinary tract. Your urinary tract is your body's drainage system for removing wastes and extra water. Your urinary tract includes two kidneys, two ureters, a bladder, and a urethra. Your kidneys are a pair of bean-shaped organs. Each kidney is about the size of your fist. They are located below your ribs, one on each side of your spine. °CAUSES °Infections are caused by microbes, which are microscopic organisms, including fungi, viruses, and bacteria. These organisms are so small that they can only be seen through a microscope. Bacteria are the microbes that most commonly cause UTIs. °SYMPTOMS  °Symptoms of UTIs may vary by age and gender of the patient and by the location of the infection. Symptoms in young women typically include a frequent and intense urge to urinate and a painful, burning feeling in the bladder or urethra during urination. Older women and men are more likely to be tired, shaky, and weak and have muscle aches and abdominal pain. A fever may mean the infection is in your kidneys. Other symptoms of a kidney infection include pain in your back or sides below the ribs, nausea, and vomiting. °DIAGNOSIS °To diagnose a UTI, your caregiver will ask you about your symptoms. Your caregiver also will ask to provide a urine sample. The urine sample will be tested for bacteria and white blood cells. White blood cells are made by your body to help fight infection. °TREATMENT  °Typically, UTIs can be treated with medication. Because most UTIs are caused by a bacterial infection, they usually can be treated with the use of antibiotics. The choice of antibiotic and length of treatment depend on your symptoms and the type of bacteria causing your infection. °HOME CARE INSTRUCTIONS °· If you were prescribed antibiotics, take them exactly as your caregiver instructs you. Finish the medication even if you feel better after you  have only taken some of the medication. °· Drink enough water and fluids to keep your urine clear or pale yellow. °· Avoid caffeine, tea, and carbonated beverages. They tend to irritate your bladder. °· Empty your bladder often. Avoid holding urine for long periods of time. °· Empty your bladder before and after sexual intercourse. °· After a bowel movement, women should cleanse from front to back. Use each tissue only once. °SEEK MEDICAL CARE IF:  °· You have back pain. °· You develop a fever. °· Your symptoms do not begin to resolve within 3 days. °SEEK IMMEDIATE MEDICAL CARE IF:  °· You have severe back pain or lower abdominal pain. °· You develop chills. °· You have nausea or vomiting. °· You have continued burning or discomfort with urination. °MAKE SURE YOU:  °· Understand these instructions. °· Will watch your condition. °· Will get help right away if you are not doing well or get worse. °Document Released: 11/08/2004 Document Revised: 07/31/2011 Document Reviewed: 03/09/2011 °ExitCare® Patient Information ©2015 [employer]. This information is not intended to replace advice given to you by your health care provider. Make sure you discuss any questions you have with your health care provider. ° ° °Abdominal Pain, Women °Abdominal (stomach, pelvic, or belly) pain can be caused by many things. It is important to tell your doctor: °· The location of the pain. °· Does it come and go or is it present all the time? °· Are there things that start the pain (eating certain foods, exercise)? °· Are there other symptoms associated with the pain (fever, nausea, vomiting,   diarrhea)? °All of this is helpful to know when trying to find the cause of the pain. °CAUSES  °· Stomach: virus or bacteria infection, or ulcer. °· Intestine: appendicitis (inflamed appendix), regional ileitis (Crohn's disease), ulcerative colitis (inflamed colon), irritable bowel syndrome, diverticulitis (inflamed diverticulum of the colon), or  cancer of the stomach or intestine. °· Gallbladder disease or stones in the gallbladder. °· Kidney disease, kidney stones, or infection. °· Pancreas infection or cancer. °· Fibromyalgia (pain disorder). °· Diseases of the female organs: °· Uterus: fibroid (non-cancerous) tumors or infection. °· Fallopian tubes: infection or tubal pregnancy. °· Ovary: cysts or tumors. °· Pelvic adhesions (scar tissue). °· Endometriosis (uterus lining tissue growing in the pelvis and on the pelvic organs). °· Pelvic congestion syndrome (female organs filling up with blood just before the menstrual period). °· Pain with the menstrual period. °· Pain with ovulation (producing an egg). °· Pain with an IUD (intrauterine device, birth control) in the uterus. °· Cancer of the female organs. °· Functional pain (pain not caused by a disease, may improve without treatment). °· Psychological pain. °· Depression. °DIAGNOSIS  °Your doctor will decide the seriousness of your pain by doing an examination. °· Blood tests. °· X-rays. °· Ultrasound. °· CT scan (computed tomography, special type of X-ray). °· MRI (magnetic resonance imaging). °· Cultures, for infection. °· Barium enema (dye inserted in the large intestine, to better view it with X-rays). °· Colonoscopy (looking in intestine with a lighted tube). °· Laparoscopy (minor surgery, looking in abdomen with a lighted tube). °· Major abdominal exploratory surgery (looking in abdomen with a large incision). °TREATMENT  °The treatment will depend on the cause of the pain.  °· Many cases can be observed and treated at home. °· Over-the-counter medicines recommended by your caregiver. °· Prescription medicine. °· Antibiotics, for infection. °· Birth control pills, for painful periods or for ovulation pain. °· Hormone treatment, for endometriosis. °· Nerve blocking injections. °· Physical therapy. °· Antidepressants. °· Counseling with a psychologist or psychiatrist. °· Minor or major surgery. °HOME  CARE INSTRUCTIONS  °· Do not take laxatives, unless directed by your caregiver. °· Take over-the-counter pain medicine only if ordered by your caregiver. Do not take aspirin because it can cause an upset stomach or bleeding. °· Try a clear liquid diet (broth or water) as ordered by your caregiver. Slowly move to a bland diet, as tolerated, if the pain is related to the stomach or intestine. °· Have a thermometer and take your temperature several times a day, and record it. °· Bed rest and sleep, if it helps the pain. °· Avoid sexual intercourse, if it causes pain. °· Avoid stressful situations. °· Keep your follow-up appointments and tests, as your caregiver orders. °· If the pain does not go away with medicine or surgery, you may try: °¨ Acupuncture. °¨ Relaxation exercises (yoga, meditation). °¨ Group therapy. °¨ Counseling. °SEEK MEDICAL CARE IF:  °· You notice certain foods cause stomach pain. °· Your home care treatment is not helping your pain. °· You need stronger pain medicine. °· You want your IUD removed. °· You feel faint or lightheaded. °· You develop nausea and vomiting. °· You develop a rash. °· You are having side effects or an allergy to your medicine. °SEEK IMMEDIATE MEDICAL CARE IF:  °· Your pain does not go away or gets worse. °· You have a fever. °· Your pain is felt only in portions of the abdomen. The right side could possibly be appendicitis. The   left lower portion of the abdomen could be colitis or diverticulitis. °· You are passing blood in your stools (bright red or black tarry stools, with or without vomiting). °· You have blood in your urine. °· You develop chills, with or without a fever. °· You pass out. °MAKE SURE YOU:  °· Understand these instructions. °· Will watch your condition. °· Will get help right away if you are not doing well or get worse. °Document Released: 11/26/2006 Document Revised: 06/15/2013 Document Reviewed: 12/16/2008 °ExitCare® Patient Information ©2015 [employer]  [employer]. This information is not intended to replace advice given to you by your health care provider. Make sure you discuss any questions you have with your health care provider. ° °

## 2013-10-13 NOTE — MAU Provider Note (Signed)
Chief Complaint: Abdominal Pain   First Provider Initiated Contact with Patient 10/13/13 1306      SUBJECTIVE HPI: Jerzy Crotteau is a 33 y.o. [id number] female who presents with complaints of cramping, frequent BV, adn irreg spotting and menstrual periods since placement of Mirena IUD last year. Saw Dr. At [facility] regarding these problems. Urged pt to give her body a little longer to adjust to IUD, but problems continued. Pt requests removal of IUD today. States she is not sexually active.   Past Medical History  Diagnosis Date  . Pericarditis 2011   OB History  Gravida Para Term Preterm AB SAB TAB Ectopic Multiple Living  8 6 6  0 2 0 2 0 0 6    # Outcome Date GA Lbr Len/2nd Weight Sex Delivery Anes PTL Lv  8 TRM 07/15/12 [redacted]w[redacted]d 05:01 / 00:13 3.66 kg (8 lb 1.1 oz) F SVD EPI  Y  7 TRM  [redacted]w[redacted]d  3.09 kg (6 lb 13 oz)  SVD EPI    6 TRM  [redacted]w[redacted]d  3.118 kg (6 lb 14 oz)  SVD EPI    5 TRM  [redacted]w[redacted]d  2.892 kg (6 lb 6 oz)  SVD EPI    4 TRM  [redacted]w[redacted]d  3.515 kg (7 lb 12 oz)  SVD EPI    3 TRM  [redacted]w[redacted]d  2.665 kg (5 lb 14 oz)  SVD None    2 TAB           1 TAB              Past Surgical History  Procedure Laterality Date  . No past surgeries     History   Social History  . Marital Status: Single    Spouse Name: N/A    Number of Children: N/A  . Years of Education: N/A   Occupational History  . Not on file.   Social History Main Topics  . Smoking status: Never Smoker   . Smokeless tobacco: Never Used  . Alcohol Use: No  . Drug Use: No  . Sexual Activity: No     Comment: plans IUD  ?Paraguard   Other Topics Concern  . Not on file   Social History Narrative  . No narrative on file   No current facility-administered medications on file prior to encounter.   No current outpatient prescriptions on file prior to encounter.   Allergies  Allergen Reactions  . Penicillins Anaphylaxis    Has not had reaction to Amoxicillin yet.    ROS: Pos for cramping spotting (none now), malodorous urine,  frequency. Denies fever, chills, N/V/D/C, vaginal discharge, hematuria urgency, flank pain.  OBJECTIVE Blood pressure 113/82, pulse 58, temperature 98 F (36.7 C), temperature source Oral, resp. rate 18, height 5' 2.5" (1.588 m), weight 64.864 kg (143 lb), last menstrual period 10/03/2013, not currently breastfeeding. GENERAL: Well-developed, well-nourished female in no acute distress.  HEENT: Normocephalic HEART: normal rate RESP: normal effort ABDOMEN: Soft, non-tender. Pos BS. No CVAT.  EXTREMITIES: Nontender, no edema NEURO: Alert and oriented SPECULUM EXAM: NEFG, moderate amount of white/mucoid, mildly malodorous discharge, no blood noted, cervix clean. Strings seen. BIMANUAL: cervix closed; uterus normal size, no adnexal tenderness or masses. No CMT.  LAB RESULTS Results for orders placed during the hospital encounter of 10/13/13 (from the past 24 hour(s))  URINALYSIS, ROUTINE W REFLEX MICROSCOPIC     Status: Abnormal   Collection Time    10/13/13 10:00 AM      Result Value Ref  Range   Color, Urine YELLOW  YELLOW   APPearance CLEAR  CLEAR   Specific Gravity, Urine 1.020  1.005 - 1.030   pH 6.0  5.0 - 8.0   Glucose, UA NEGATIVE  NEGATIVE mg/dL   Hgb urine dipstick TRACE (*) NEGATIVE   Bilirubin Urine NEGATIVE  NEGATIVE   Ketones, ur NEGATIVE  NEGATIVE mg/dL   Protein, ur NEGATIVE  NEGATIVE mg/dL   Urobilinogen, UA 1.0  0.0 - 1.0 mg/dL   Nitrite POSITIVE (*) NEGATIVE   Leukocytes, UA NEGATIVE  NEGATIVE  URINE MICROSCOPIC-ADD ON     Status: Abnormal   Collection Time    10/13/13 10:00 AM      Result Value Ref Range   Squamous Epithelial / LPF RARE  RARE   WBC, UA 3-6  <3 WBC/hpf   Bacteria, UA MANY (*) RARE  POCT PREGNANCY, URINE     Status: None   Collection Time    10/13/13 10:07 AM      Result Value Ref Range   Preg Test, Ur NEGATIVE  NEGATIVE  CBC     Status: None   Collection Time    10/13/13 10:40 AM      Result Value Ref Range   WBC 5.4  4.0 - 10.5 K/uL    RBC 4.22  3.87 - 5.11 MIL/uL   Hemoglobin 12.9  12.0 - 15.0 g/dL   HCT 37.0  36.0 - 46.0 %   MCV 87.7  78.0 - 100.0 fL   MCH 30.6  26.0 - 34.0 pg   MCHC 34.9  30.0 - 36.0 g/dL   RDW 12.5  11.5 - 15.5 %   Platelets 199  150 - 400 K/uL  WET PREP, GENITAL     Status: Abnormal   Collection Time    10/13/13  1:11 PM      Result Value Ref Range   Yeast Wet Prep HPF POC NONE SEEN  NONE SEEN   Trich, Wet Prep NONE SEEN  NONE SEEN   Clue Cells Wet Prep HPF POC FEW (*) NONE SEEN   WBC, Wet Prep HPF POC FEW (*) NONE SEEN    IMAGING US Transvaginal Non-ob  10/13/2013   CLINICAL DATA:  Pelvic pain .  IUD.  EXAM: TRANSABDOMINAL ULTRASOUND OF PELVIS  TECHNIQUE: Transabdominal ultrasound examination of the pelvis was performed including evaluation of the uterus, ovaries, adnexal regions, and pelvic cul-de-sac.  COMPARISON:  None.  FINDINGS: Uterus  Measurements: 8.8 x 5.9 x 6.4 cm. No fibroids or other mass visualized.  Endometrium  Thickness: 3.7 mm. IUD in good anatomic position. No focal abnormality.  Right ovary  Measurements: 4.0 x 2.4 x 2.1 cm. Normal appearance/no adnexal mass.  Left ovary  Measurements: 3.3 x 1.5 x 2.2 cm. Normal appearance/no adnexal mass.  Other findings:  No free fluid  IMPRESSION: No acute abnormality.  IUD in good anatomic position.   Electronically Signed   By: Amanda   On: 10/13/2013 11:30   US Pelvis Complete  10/13/2013   CLINICAL DATA:  Pelvic pain .  IUD.  EXAM: TRANSABDOMINAL ULTRASOUND OF PELVIS  TECHNIQUE: Transabdominal ultrasound examination of the pelvis was performed including evaluation of the uterus, ovaries, adnexal regions, and pelvic cul-de-sac.  COMPARISON:  None.  FINDINGS: Uterus  Measurements: 8.8 x 5.9 x 6.4 cm. No fibroids or other mass visualized.  Endometrium  Thickness: 3.7 mm. IUD in good anatomic position. No focal abnormality.  Right ovary  Measurements: 4.0 x 2.4 x  2.1 cm. Normal appearance/no adnexal mass.  Left ovary  Measurements: 3.3 x  1.5 x 2.2 cm. Normal appearance/no adnexal mass.  Other findings:  No free fluid  IMPRESSION: No acute abnormality.  IUD in good anatomic position.   Electronically Signed   By: Marcello Moores  Register   On: 10/13/2013 11:30   MAU COURSE Discussed proper placement of IUD and UTI Dx. Also discussed offered course of OCPs may regulate bleeding and decrease spotting. May not. Pt strongly desires removal of IUD. Verbalizes understanding that contraception will immediately stop and that IUD removal my not stop all complaints.   IUD strings grasped w/ Ring forceps. Removed easily w/ gentle traction, intact. No bleeding. Pt tolerated well.   ASSESSMENT 1. IUD complication, initial encounter   2. Acute cystitis without hematuria     PLAN Discharge home in stable condition.  Push fluids. Need new contraception ASAP.     Follow-up Information   Follow up with [facility]. (As needed if no improvement)    Specialty:  Obstetrics and Gynecology   Contact information:   [address] [place] [id number] [phone]      Follow up with [facility]. (As needed in emergencies)    Contact information:   [address] [id number] [place] [address] [phone]       Medication List         acetaminophen 500 MG tablet  Commonly known as:  TYLENOL  Take 1,000 mg by mouth every 6 (six) hours as needed for headache.     ibuprofen 600 MG tablet  Commonly known as:  ADVIL,MOTRIN  Take 1 tablet (600 mg total) by mouth every 6 (six) hours as needed.     sulfamethoxazole-trimethoprim 800-160 MG per tablet  Commonly known as:  BACTRIM DS  Take 1 tablet by mouth 2 (two) times daily.         Park Layne, CNM 10/13/2013  1:53 PM

## 2013-10-13 NOTE — MAU Note (Signed)
Has been having abd pain since IUD was place last year.  Bled 3 months after placement, then it went to regular bleeding.  Past 3 wks has started bleeding and spotting again, daily. Had check up a couple months ago, discussed ongoing pain asked for it to be removed, feels like pain is related.  Also has been getting infections since it was placed, feels it is responsible. Urine has been very strong.

## 2013-10-14 LAB — GC/CHLAMYDIA PROBE AMP
CT Probe RNA: NEGATIVE
GC Probe RNA: NEGATIVE

## 2013-10-20 NOTE — MAU Provider Note (Signed)
Attestation of Attending Supervision of Advanced Practitioner (PA/CNM/NP): Evaluation and management procedures were performed by the Advanced Practitioner under my supervision and collaboration.  I have reviewed the Advanced Practitioner's note and chart, and I agree with the management and plan.  Jacob Stinson, DO Attending Physician Faculty Practice, [facility]  

## 2013-12-14 ENCOUNTER — Encounter (HOSPITAL_COMMUNITY): Payer: Self-pay | Admitting: *Deleted

## 2014-02-12 NOTE — L&D Delivery Note (Signed)
Delivery Note At 1:21 PM a viable female was delivered via vertex (Presentation: LOA).  APGAR: 8, 9; weight pending.   Placenta status: Intact, Spontaneous.  Cord: 3vc. No complications.    Anesthesia:  Epidural Episiotomy: none  Lacerations:  none Est. Blood Loss (mL):  267mL  Mom to postpartum.  Baby to Couplet care / Skin to Skin.  Truett Mainland, DO 08/13/2014, 1:54 PM

## 2014-03-01 ENCOUNTER — Encounter (HOSPITAL_COMMUNITY): Payer: Self-pay | Admitting: *Deleted

## 2014-03-01 ENCOUNTER — Inpatient Hospital Stay (HOSPITAL_COMMUNITY)
Admission: AD | Admit: 2014-03-01 | Discharge: 2014-03-01 | Disposition: A | Discharge status: Home / Self Care | Payer: Medicaid Other | Source: Ambulatory Visit | Attending: Family Medicine | Admitting: Family Medicine

## 2014-03-01 DIAGNOSIS — Z8249 Family history of ischemic heart disease and other diseases of the circulatory system: Secondary | ICD-10-CM | POA: Insufficient documentation

## 2014-03-01 DIAGNOSIS — N949 Unspecified condition associated with female genital organs and menstrual cycle: Secondary | ICD-10-CM

## 2014-03-01 DIAGNOSIS — Z823 Family history of stroke: Secondary | ICD-10-CM | POA: Insufficient documentation

## 2014-03-01 DIAGNOSIS — M25531 Pain in right wrist: Secondary | ICD-10-CM

## 2014-03-01 DIAGNOSIS — Z3A17 17 weeks gestation of pregnancy: Secondary | ICD-10-CM | POA: Insufficient documentation

## 2014-03-01 DIAGNOSIS — O9989 Other specified diseases and conditions complicating pregnancy, childbirth and the puerperium: Secondary | ICD-10-CM | POA: Insufficient documentation

## 2014-03-01 DIAGNOSIS — R102 Pelvic and perineal pain: Secondary | ICD-10-CM

## 2014-03-01 DIAGNOSIS — R51 Headache: Secondary | ICD-10-CM | POA: Insufficient documentation

## 2014-03-01 HISTORY — DX: Benign neoplasm of thyroid gland: D34

## 2014-03-01 LAB — URINALYSIS, ROUTINE W REFLEX MICROSCOPIC
BILIRUBIN URINE: NEGATIVE
GLUCOSE, UA: NEGATIVE mg/dL
HGB URINE DIPSTICK: NEGATIVE
KETONES UR: NEGATIVE mg/dL
Leukocytes, UA: NEGATIVE
NITRITE: NEGATIVE
PH: 7.5 (ref 5.0–8.0)
Protein, ur: NEGATIVE mg/dL
Specific Gravity, Urine: 1.025 (ref 1.005–1.030)
Urobilinogen, UA: 2 mg/dL — ABNORMAL HIGH (ref 0.0–1.0)

## 2014-03-01 LAB — WET PREP, GENITAL
Clue Cells Wet Prep HPF POC: NONE SEEN
Trich, Wet Prep: NONE SEEN
WBC WET PREP: NONE SEEN
YEAST WET PREP: NONE SEEN

## 2014-03-01 LAB — CBC
HCT: 34.7 % — ABNORMAL LOW (ref 36.0–46.0)
HEMOGLOBIN: 12.4 g/dL (ref 12.0–15.0)
MCH: 30.8 pg (ref 26.0–34.0)
MCHC: 35.7 g/dL (ref 30.0–36.0)
MCV: 86.1 fL (ref 78.0–100.0)
Platelets: 187 10*3/uL (ref 150–400)
RBC: 4.03 MIL/uL (ref 3.87–5.11)
RDW: 13.5 % (ref 11.5–15.5)
WBC: 9.3 10*3/uL (ref 4.0–10.5)

## 2014-03-01 LAB — RAPID HIV SCREEN (WH-MAU): Rapid HIV Screen: NONREACTIVE

## 2014-03-01 MED ORDER — BUTALBITAL-APAP-CAFFEINE 50-325-40 MG PO TABS
2.0000 | ORAL_TABLET | Freq: Once | ORAL | Status: AC
  Administered 2014-03-01: 2 via ORAL
  Filled 2014-03-01: qty 2

## 2014-03-01 MED ORDER — BUTALBITAL-APAP-CAFFEINE 50-325-40 MG PO TABS: 1.0000 | ORAL_TABLET | Freq: Four times a day (QID) | ORAL | Status: DC | PRN

## 2014-03-01 NOTE — MAU Provider Note (Signed)
History     CSN: [id number]  Arrival date and time: 03/01/14 1141   First Provider Initiated Contact with Patient 03/01/14 1451      Chief Complaint  Patient presents with  . Abdominal Pain  . Arm Pain   HPI   Jennifer Dunlap is a 34 y.o. female [phone] at [redacted]w[redacted]d who presents abdominal pain that she feels is round ligament pain, HA and right wrist pain.   She has had faint right rist pain in the past, however recenently the pain in her wrist has become worse. She is having a hard time opening a can of food because the pain is intense. The pain is sometimes worse at night. She denies trauma or pain currently. The pain comes and goes and is sporadic. She denies history of HA/ migraines, however in this pregnancy she has had frequent HA.  She took tylenol 4 days ago for the HA and it helped only minimal.  She has a HA currently. She currently rates her pain 5/10.   She drinks very minimal water throughout the day and drinks soda on occasion.  She drinks a cup of coffee per day.   Denies vaginal bleeding   The abdominal pain comes and goes. She is unable to tell if the pain worsens with movement or if it gets better when she sits down.  Last intercourse was 24 hours ago.    OB History    Gravida Para Term Preterm AB TAB SAB Ectopic Multiple Living   9 6 6  0 2 2 0 0 0 6      Past Medical History  Diagnosis Date  . Pericarditis 2011  . Benign tumor of thyroid gland     Past Surgical History  Procedure Laterality Date  . No past surgeries      Family History  Problem Relation Age of Onset  . Heart disease Father   . Heart attack Father   . Cancer Sister 44    ovarian  . Stroke Father   . Brain cancer Maternal Grandmother   . Hypertension Mother   . Heart attack Paternal Grandmother     History  Substance Use Topics  . Smoking status: Never Smoker   . Smokeless tobacco: Never Used  . Alcohol Use: No    Allergies:  Allergies  Allergen Reactions  . Penicillins  Anaphylaxis    Has not had reaction to Amoxicillin yet.    Prescriptions prior to admission  Medication Sig Dispense Refill Last Dose  . acetaminophen (TYLENOL) 500 MG tablet Take 1,000 mg by mouth every 6 (six) hours as needed for headache.   Past Week at Unknown time  . flintstones complete (FLINTSTONES) 60 MG chewable tablet Chew 2 tablets by mouth daily.   02/28/2014 at Unknown time   Results for orders placed or performed during the hospital encounter of 03/01/14 (from the past 48 hour(s))  Urinalysis, Routine w reflex microscopic     Status: Abnormal   Collection Time: 03/01/14 12:05 PM  Result Value Ref Range   Color, Urine YELLOW YELLOW   APPearance CLEAR CLEAR   Specific Gravity, Urine 1.025 1.005 - 1.030   pH 7.5 5.0 - 8.0   Glucose, UA NEGATIVE NEGATIVE mg/dL   Hgb urine dipstick NEGATIVE NEGATIVE   Bilirubin Urine NEGATIVE NEGATIVE   Ketones, ur NEGATIVE NEGATIVE mg/dL   Protein, ur NEGATIVE NEGATIVE mg/dL   Urobilinogen, UA 2.0 (H) 0.0 - 1.0 mg/dL   Nitrite NEGATIVE NEGATIVE   Leukocytes, UA  NEGATIVE NEGATIVE    Comment: MICROSCOPIC NOT DONE ON URINES WITH NEGATIVE PROTEIN, BLOOD, LEUKOCYTES, NITRITE, OR GLUCOSE <1000 mg/dL.  CBC     Status: Abnormal   Collection Time: 03/01/14  3:07 PM  Result Value Ref Range   WBC 9.3 4.0 - 10.5 K/uL   RBC 4.03 3.87 - 5.11 MIL/uL   Hemoglobin 12.4 12.0 - 15.0 g/dL   HCT 34.7 (L) 36.0 - 46.0 %   MCV 86.1 78.0 - 100.0 fL   MCH 30.8 26.0 - 34.0 pg   MCHC 35.7 30.0 - 36.0 g/dL   RDW 13.5 11.5 - 15.5 %   Platelets 187 150 - 400 K/uL  Rapid HIV screen     Status: None   Collection Time: 03/01/14  3:07 PM  Result Value Ref Range   SUDS Rapid HIV Screen NON REACTIVE NON REACTIVE  Wet prep, genital     Status: None   Collection Time: 03/01/14  3:48 PM  Result Value Ref Range   Yeast Wet Prep HPF POC NONE SEEN NONE SEEN   Trich, Wet Prep NONE SEEN NONE SEEN   Clue Cells Wet Prep HPF POC NONE SEEN NONE SEEN   WBC, Wet Prep HPF POC  NONE SEEN NONE SEEN    Comment: MODERATE BACTERIA SEEN      Review of Systems  Gastrointestinal: Positive for abdominal pain.  Genitourinary: Negative for dysuria, urgency, frequency and hematuria.       Denies vaginal bleeding   Musculoskeletal:       Right wrist pain   Neurological: Positive for headaches.   Physical Exam   Blood pressure 127/72, pulse 80, temperature 98.7 F (37.1 C), temperature source Oral, resp. rate 18, weight 75.297 kg (166 lb), last menstrual period 10/30/2013, not currently breastfeeding.  Physical Exam  Constitutional: She is oriented to person, place, and time. She appears well-developed and well-nourished. No distress.  HENT:  Head: Normocephalic.  Eyes: Pupils are equal, round, and reactive to light.  Neck: Neck supple.  Cardiovascular: Normal rate and normal heart sounds.   Respiratory: Effort normal and breath sounds normal. No respiratory distress.  GI: Soft. There is no tenderness.  Genitourinary:  Speculum exam: Vagina - Small amount of creamy discharge, no odor Cervix - No contact bleeding Bimanual exam: Cervix closed Uterus non tender, enlarged (?15 weeks) Adnexa non tender, no masses bilaterally GC/Chlam, wet prep done Chaperone present for exam.   Musculoskeletal:       Right hand: Normal. She exhibits normal range of motion and no tenderness.  Neurological: She is alert and oriented to person, place, and time. GCS eye subscore is 4. GCS verbal subscore is 5. GCS motor subscore is 6.  Skin: She is not diaphoretic.    MAU Course  Procedures  None  MDM UA Wet prep GC +fht Fioricet 2 tabs for HA. Patient declines HA pain following medication   Assessment and Plan   A:  Right wrist pain; likely carpal tunnel  Round ligament pain  Headache in pregnancy    P:  Discharge home in stable condition Start prenatal care ASAP Message sent to the clinic Pregnancy verification letter given Prenatal vitamins daily RX:  Fioricet Return to MAU if symptoms worsen Right wrist brace recommended.    Darrelyn Hillock Rasch, NP 03/01/2014 5:22 PM

## 2014-03-01 NOTE — MAU Note (Signed)
No prenatal care yet,  Been having some ligament pain  Last couple days.   Having pain in rt arm, wrist up forearm.

## 2014-03-01 NOTE — MAU Note (Signed)
Urine in lab 

## 2014-03-02 LAB — GC/CHLAMYDIA PROBE AMP (~~LOC~~) NOT AT ARMC
Chlamydia: NEGATIVE
Neisseria Gonorrhea: NEGATIVE

## 2014-03-26 ENCOUNTER — Encounter: Payer: Medicaid Other | Admitting: Advanced Practice Midwife

## 2014-04-02 ENCOUNTER — Ambulatory Visit (INDEPENDENT_AMBULATORY_CARE_PROVIDER_SITE_OTHER): Payer: Self-pay | Admitting: Certified Nurse Midwife

## 2014-04-02 ENCOUNTER — Encounter: Payer: Self-pay | Admitting: Certified Nurse Midwife

## 2014-04-02 VITALS — BP 119/59 | HR 69 | Temp 97.9°F | Wt 169.9 lb

## 2014-04-02 DIAGNOSIS — O0942 Supervision of pregnancy with grand multiparity, second trimester: Secondary | ICD-10-CM

## 2014-04-02 DIAGNOSIS — Z23 Encounter for immunization: Secondary | ICD-10-CM

## 2014-04-02 DIAGNOSIS — Z3201 Encounter for pregnancy test, result positive: Secondary | ICD-10-CM

## 2014-04-02 DIAGNOSIS — Z3492 Encounter for supervision of normal pregnancy, unspecified, second trimester: Secondary | ICD-10-CM

## 2014-04-02 DIAGNOSIS — Z3482 Encounter for supervision of other normal pregnancy, second trimester: Secondary | ICD-10-CM

## 2014-04-02 DIAGNOSIS — O4702 False labor before 37 completed weeks of gestation, second trimester: Secondary | ICD-10-CM

## 2014-04-02 DIAGNOSIS — O0932 Supervision of pregnancy with insufficient antenatal care, second trimester: Secondary | ICD-10-CM | POA: Insufficient documentation

## 2014-04-02 DIAGNOSIS — R221 Localized swelling, mass and lump, neck: Secondary | ICD-10-CM

## 2014-04-02 DIAGNOSIS — Z3480 Encounter for supervision of other normal pregnancy, unspecified trimester: Secondary | ICD-10-CM | POA: Insufficient documentation

## 2014-04-02 LAB — POCT URINALYSIS DIP (DEVICE)
BILIRUBIN URINE: NEGATIVE
GLUCOSE, UA: NEGATIVE mg/dL
Hgb urine dipstick: NEGATIVE
KETONES UR: NEGATIVE mg/dL
Leukocytes, UA: NEGATIVE
Nitrite: NEGATIVE
Protein, ur: NEGATIVE mg/dL
Specific Gravity, Urine: 1.015 (ref 1.005–1.030)
Urobilinogen, UA: 0.2 mg/dL (ref 0.0–1.0)
pH: 8.5 — ABNORMAL HIGH (ref 5.0–8.0)

## 2014-04-02 NOTE — Assessment & Plan Note (Signed)
  Clinic [facility] Prenatal Labs  Dating LMP Blood type:     Genetic Screen 1 Screen:    AFP:     Quad:     NIPS: Antibody:   Anatomic [place] scheduled Rubella:    GTT Early:               Third trimester:  RPR:     Flu vaccine  HBsAg:     TDaP vaccine                                               Rhogam: HIV:     GBS                                              (For PCN allergy, check sensitivities) GBS:   Contraception  Pap:04/02/14  Baby Food BREAST   Circumcision    Pediatrician    Support Person

## 2014-04-02 NOTE — Progress Notes (Signed)
Here for initial visit. Discussed bmi/ appropriate weight gain which she has exceeded at this point.

## 2014-04-02 NOTE — Progress Notes (Signed)
Subjective:    Jennifer Dunlap is a [id number] [redacted]w[redacted]d being seen today for her first obstetrical visit.  Her obstetrical history is significant for grand multiparity, thyroidmegaly.  Patient does intend to breast feed. Pregnancy history fully reviewed.  Patient reports headache.  Filed Vitals:   04/02/14 0821  BP: 119/59  Pulse: 69  Temp: 97.9 F (36.6 C)  Weight: 77.066 kg (169 lb 14.4 oz)    HISTORY: OB History  Gravida Para Term Preterm AB SAB TAB Ectopic Multiple Living  9 6 6  0 2 0 2 0 0 6    # Outcome Date GA Lbr Len/2nd Weight Sex Delivery Anes PTL Lv  9 Current           8 Term 07/15/12 [redacted]w[redacted]d 05:01 / 00:13 3.66 kg (8 lb 1.1 oz) F Vag-Spont EPI  Y  7 TAB           6 Term  [redacted]w[redacted]d  3.09 kg (6 lb 13 oz)  Vag-Spont EPI    5 Term  [redacted]w[redacted]d  3.118 kg (6 lb 14 oz)  Vag-Spont EPI    4 Term  [redacted]w[redacted]d  2.892 kg (6 lb 6 oz)  Vag-Spont EPI    3 Term  [redacted]w[redacted]d  3.515 kg (7 lb 12 oz)  Vag-Spont EPI    2 Term  [redacted]w[redacted]d  2.665 kg (5 lb 14 oz)  Vag-Spont None    1 TAB              Past Medical History  Diagnosis Date  . Pericarditis 2011  . Benign tumor of thyroid gland    Past Surgical History  Procedure Laterality Date  . No past surgeries     Family History  Problem Relation Age of Onset  . Heart disease Father   . Heart attack Father   . Stroke Father   . Cancer Sister 27    ovarian  . Brain cancer Maternal Grandmother   . Hypertension Mother   . Heart attack Paternal Grandmother      Exam    Uterus:  Fundal Height: 24 cm  Pelvic Exam:    Perineum: No Hemorrhoids   Vulva:    Vagina:  normal mucosa   pH:    Cervix: no cervical motion tenderness   Adnexa: normal adnexa and positive for: =   Bony Pelvis: gynecoid  System: Breast:  normal appearance, no masses or tenderness, deferred   Skin: normal coloration and turgor, no rashes    Neurologic: oriented, normal mood   Extremities: normal strength, tone, and muscle mass   HEENT large thryroid goiter   Mouth/Teeth mucous  membranes moist, pharynx normal without lesions and dental hygiene good   Neck Enlarged thyroid goiter   Cardiovascular: regular rate and rhythm   Respiratory:  appears well, vitals normal, no respiratory distress, acyanotic, normal RR, ear and throat exam is normal   Abdomen: soft, non-tender; bowel sounds normal; no masses,  no organomegaly   Urinary: urethral meatus normal      Assessment:    Pregnancy: [id number] Patient Active Problem List   Diagnosis Date Noted  . [place] multipara in labor in second trimester 04/02/2014  . Late prenatal care affecting pregnancy in second trimester, antepartum 04/02/2014  . Pregnancy, normal subsequent 04/02/2014  . 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 History of maternal pericarditis 02/20/2012        Plan:     Initial labs drawn. Prenatal vitamins. Problem list reviewed and updated. Genetic Screening discussed Quad Screen:  too late.  Ultrasound discussed; fetal survey: requested.  Follow up in 4 weeks. 50% of 30 min visit spent on counseling and coordination of care.  Schedule Anatomy U/S Schedule Thyroid ultrasound TSH, Free t4, Free T3    Clemmons,Lori Grissett 04/02/2014

## 2014-04-02 NOTE — Progress Notes (Signed)
Bedside US for FHR check = 152 bpm per PW doppler.  FM present.

## 2014-04-02 NOTE — Patient Instructions (Signed)
Second Trimester of Pregnancy The second trimester is from week 13 through week 28, months 4 through 6. The second trimester is often a time when you feel your best. Your body has also adjusted to being pregnant, and you begin to feel better physically. Usually, morning sickness has lessened or quit completely, you may have more energy, and you may have an increase in appetite. The second trimester is also a time when the fetus is growing rapidly. At the end of the sixth month, the fetus is about 9 inches long and weighs about 1 pounds. You will likely begin to feel the baby move (quickening) between 18 and 20 weeks of the pregnancy. BODY CHANGES Your body goes through many changes during pregnancy. The changes vary from woman to woman.   Your weight will continue to increase. You will notice your lower abdomen bulging out.  You may begin to get stretch marks on your hips, abdomen, and breasts.  You may develop headaches that can be relieved by medicines approved by your health care provider.  You may urinate more often because the fetus is pressing on your bladder.  You may develop or continue to have heartburn as a result of your pregnancy.  You may develop constipation because certain hormones are causing the muscles that push waste through your intestines to slow down.  You may develop hemorrhoids or swollen, bulging veins (varicose veins).  You may have back pain because of the weight gain and pregnancy hormones relaxing your joints between the bones in your pelvis and as a result of a shift in weight and the muscles that support your balance.  Your breasts will continue to grow and be tender.  Your gums may bleed and may be sensitive to brushing and flossing.  Dark spots or blotches (chloasma, mask of pregnancy) may develop on your face. This will likely fade after the baby is born.  A dark line from your belly button to the pubic area (linea nigra) may appear. This will likely fade  after the baby is born.  You may have changes in your hair. These can include thickening of your hair, rapid growth, and changes in texture. Some women also have hair loss during or after pregnancy, or hair that feels dry or thin. Your hair will most likely return to normal after your baby is born. WHAT TO EXPECT AT YOUR PRENATAL VISITS During a routine prenatal visit:  You will be weighed to make sure you and the fetus are growing normally.  Your blood pressure will be taken.  Your abdomen will be measured to track your baby's growth.  The fetal heartbeat will be listened to.  Any test results from the previous visit will be discussed. Your health care provider may ask you:  How you are feeling.  If you are feeling the baby move.  If you have had any abnormal symptoms, such as leaking fluid, bleeding, severe headaches, or abdominal cramping.  If you have any questions. Other tests that may be performed during your second trimester include:  Blood tests that check for:  Low iron levels (anemia).  Gestational diabetes (between 24 and 28 weeks).  Rh antibodies.  Urine tests to check for infections, diabetes, or protein in the urine.  An ultrasound to confirm the proper growth and development of the baby.  An amniocentesis to check for possible genetic problems.  Fetal screens for spina bifida and Down syndrome. HOME CARE INSTRUCTIONS   Avoid all smoking, herbs, alcohol, and unprescribed   drugs. These chemicals affect the formation and growth of the baby.  Follow your health care provider's instructions regarding medicine use. There are medicines that are either safe or unsafe to take during pregnancy.  Exercise only as directed by your health care provider. Experiencing uterine cramps is a good sign to stop exercising.  Continue to eat regular, healthy meals.  Wear a good support bra for breast tenderness.  Do not use hot tubs, steam rooms, or saunas.  Wear your  seat belt at all times when driving.  Avoid raw meat, uncooked cheese, cat litter boxes, and soil used by cats. These carry germs that can cause birth defects in the baby.  Take your prenatal vitamins.  Try taking a stool softener (if your health care provider approves) if you develop constipation. Eat more high-fiber foods, such as fresh vegetables or fruit and whole grains. Drink plenty of fluids to keep your urine clear or pale yellow.  Take warm sitz baths to soothe any pain or discomfort caused by hemorrhoids. Use hemorrhoid cream if your health care provider approves.  If you develop varicose veins, wear support hose. Elevate your feet for 15 minutes, 3-4 times a day. Limit salt in your diet.  Avoid heavy lifting, wear low heel shoes, and practice good posture.  Rest with your legs elevated if you have leg cramps or low back pain.  Visit your dentist if you have not gone yet during your pregnancy. Use a soft toothbrush to brush your teeth and be gentle when you floss.  A sexual relationship may be continued unless your health care provider directs you otherwise.  Continue to go to all your prenatal visits as directed by your health care provider. SEEK MEDICAL CARE IF:   You have dizziness.  You have mild pelvic cramps, pelvic pressure, or nagging pain in the abdominal area.  You have persistent nausea, vomiting, or diarrhea.  You have a bad smelling vaginal discharge.  You have pain with urination. SEEK IMMEDIATE MEDICAL CARE IF:   You have a fever.  You are leaking fluid from your vagina.  You have spotting or bleeding from your vagina.  You have severe abdominal cramping or pain.  You have rapid weight gain or loss.  You have shortness of breath with chest pain.  You notice sudden or extreme swelling of your face, hands, ankles, feet, or legs.  You have not felt your baby move in over an hour.  You have severe headaches that do not go away with  medicine.  You have vision changes. Document Released: 01/23/2001 Document Revised: 02/03/2013 Document Reviewed: 04/01/2012 [facility] Patient Information 2015 [place], [place]. This information is not intended to replace advice given to you by your health care provider. Make sure you discuss any questions you have with your health care provider. Prenatal Vitamin and Mineral Combinations (oral solid dosage forms) What is this medicine? PRENATAL VITAMIN AND MINERAL combinations are used before, during, and after pregnancy to help provide provide good nutrition. This medicine may be used for other purposes; ask your health care provider or pharmacist if you have questions. COMMON BRAND NAME(S): Active OB, Advanced Care Plus, Advanced NatalCare, Advanced-RF NatalCare, Aminate Fe, Anemagen OB, B-Nexa, BP FoliNatal Plus B, BP MultiNatal Plus, BP MultiNatal Plus Chewable, BP Prenate, Brainstrong, Bright Beginnings Prenatal, Cal-Nate, Calcium PNV, CareNatal DHA, CareNate 600, [place]avan One Omega, Cavan Prenatal with EC Calcium, Cavan-Alpha, Cavan-EC SOD DHA, Cavan-Heme OB, Cavan-Heme Omega, Cenogen Ultra, Centrum Specialist Prenatal, CertaVite with Antioxidants, Choice-OB + DHA,  Citracal Prenatal, Citracal Prenatal + DHA, CitraNatal 90 DHA, CitraNatal Assure, CitraNatal B-Calm, CitraNatal DHA, CitraNatal Harmony, CitraNatal Rx, Classic Prenatal, ComBi Rx, Complete Natal DHA, Complete-RF, CompleteNate, Concept DHA, Concept OB, CoreNate-DHA, Corvite FE with Quatrefolic, CRNatal DHA, Daily Vitamin, Docosavit, Duet, Duet Chewable, Duet DHA, Duet DHA 400, Duet DHA 430ec, Duet DHA Balanced, Duet DHA Complete, Duet DHA Complete Gluten Free, Duet DHA EC, Duet DHA Ferrazone, EC Omega-3, DuoVit DHA, Edge OB, Elite OB, Elite OB with DHA, Elite-OB 400, Extra-Virt Plus, Femecal OB, Femecal OB Plus DHA, Ferrocite Plus, Folbecal, Folcal DHA, Folcaps Care One, The Kroger 3, Alice One with [place], Folivane OB, Folivane-EC  Calcium DHA NF, Foltabs 90 Plus DHA, Foltabs Prenatal, Foltabs Prenatal Plus DHA, Gesticare, Gesticare DHA, Gesticare DHA Delayed-Release, HemeNatal OB, HemeNatal OB + DHA, Hemocyte Plus, HIP Prenatal, ICAR Prenatal Rx, iNatal Advance, iNatal GT, iNatal Ultra, Infanate Balance, Infanate DHA, Infanate Plus, Kolnatal, Lactocal-F, Levomefolate PNV, MACNATAL CN DHA, Marnatal-F, Marnatal-F Plus, Materna, Maxinate, Mission Prenatal, Mission Prenatal F.A., Mission Prenatal H.P., Mom's Choice Rx, Multi-Nate 30, Multi-Nate 30 DHA, Multi-Nate DHA Extra, Multifol Plus, Multivitamin With Minerals, MyNatal OB Prenatal, NataCaps, NataChew, NataFolic-OB, Natafort, Natal-V RX, NatalCare CFe 60, NatalCare GlossTabs, NatalCare PIC, BellSouth, Winn-Dixie, [employer] Station[employer], NatalCare Three, NATALVIRT 90 DHA, NATALVIRT CA, NataTab CFe, NataTab FA, Natatab Rx, Natelle, Natelle C, Natelle One, Land O'Lakes with DHA, Natelle Prefer, Natelle-ez, Navatab + DHA, Neevo, Neevo DHA, Nestabs, Nestabs ABC, Nestabs CBF, Nestabs DHA, Nestabs FA, Nestabs Rx, New Advanced Formula Prenatal Z, Nexa Plus, Nexa Select, Niferex-PN, Niferex-PN Forte, NovaNatal, NovaStart, Nu-Natal, NutraCare, Nutri-Tab OB, Nutri-Tab OB + DHA, Nutrinate, NutriSpire, O-Cal F.A., O-Cal Prenatal, OB Choice, OB Complete, OB Complete 400, OB Complete One, OB Complete Petite, OB Complete Premier, OB Complete with DHA, OB-Natal One, Obstetrix-100, Obtrex, Obtrex DHA, One-A-Day Women's, One-A-Day Women's Prenatal, OptiNate, Paire OB Tablet Plus DHA, PNV OB + DHA, PNV Prenatal, PNV Prenatal Plus Multivitamin, PNV Tabs 29-1, PNV-DHA, PNV-DHA + Docusate, PNV-DHA Plus, PNV-First, PNV-Iron, PNV-OB with DHA, PNV-Omega, PNV-Select, PNV-Total with DHA, PNV-VP-U, PR [place] 400, PR Natal 400ec, PR Natal 430, PR Natal 430ec, PR [place] 440ec, PreCare, PreferaOB, PreferaOB + DHA, PreferaOB One, Premesis Rx, Prena1 PEARL, Prena1 Plus, PrenaCare, [place], [place], Prenaissance 90  DHA, Prenaissance Balance, Prenaissance DHA, Prenaissance Harmony DHA, Prenaissance Next, Prenaissance Plus, Prenaissance Promise, PrenaPlus, Prenat with Quatrefolic, PreNata Multivitamin with Iron, Prenatabs CBF, Prenatabs FA, Prenatabs OBN, Prenatabs RX, Prenatal, Prenatal 1 Plus 1, Prenatal 19, Prenatal AD, Prenatal Formula 3, Prenatal H, Prenatal Low Iron, Prenatal MR 73 Fe, Prenatal MTR with Selenium, Prenatal Multivitamin + DHA 2, Prenatal Optima Advance, Prenatal Plus, Prenatal Plus Iron, Prenatal Plus Low Iron, Prenatal Rx with Beta Carotene, Prenatal S, Prenatal U, Prenatal Vitamin, PreNatal Vitamins Plus, Prenate Advance, Prenate AM with Quatrefolic, Prenate DHA, Prenate Elite, Prenate Enhance with Omnicom, Prenate Essential, Prenate GT, Prenate Mini, PreNate Plus, Prenate Restore with Omnicom, BellSouth, Prenate Ultra, Prenavite, Prenavite Protein, PreNexa, [place], PrePLUS, PreQue, PreTAB, Previte Rx, PrimaCare, Johnson & Johnson, PrimaCare ONE, Provida OB, PruEt DHA, PruEt DHAec, PureFe OB Plus, PureFe Plus, PureVit DualFe Plus, RE DualVit OB, RE DualVit Plus, RE OB + DHA, RE OB 90 + DHA, RE Prenatal, RE PreVit + DHA, RE-Nata 29, RE-Nata 29 OB, Reaphrim, Renate, Renate DHA, Renate DHA Extra, REocyte Plus, Right Step, Rovin-Nv, Rovin-Nv DHA, Se-Care, Se-Care Conceive, Se-Care Gesture, Se-Natal 19, Se-Natal 19 Chewable, Se-Natal 90, Se-Natal ONE, Se-Plete DHA, Se-Tan DHA, Se-Tan Plus, Select-OB, Select-OB + DHA, SetonET, SetonET-EC DHA, StrongStart, StrongStart  Chewable Tablet, Tressie Ellis One, Science Applications International, Stuart Prenatal + DHA, Stuartnatal Plus 3, Tandem DHA, Tandem OB, Tandem Plus, Taron A Prenatal Pack with DHA, Taron EC Calcium DHA Pack, Taron Prenatal with DHA, Taron-C DHA, Taron-EC Cal, Taron-Prex Prenatal with DHA, Thera [place] Complete, Thera [place] Core Nutrition, Thera [place] Lactation Support, Thera Natal OvaVite, Thera Safeway Inc, [place], TL-Care DHA, TL-Select, TL-Select  DHA, Tri Rx, Owens-Illinois, TriCare Prenatal DHA ONE, Trifera OB, Trimesis Rx, Coca Cola, Trinatal Rx 1, Trinate, Triveen-Duo DHA, Triveen-PRx RNF, Triveen-Ten, Trust PACCAR Inc, UltimateCare Advantage, UltimateCare Combo, Radiation protection practitioner ONE, UltimateCare ONE NF, Lear Corporation, VemaVite-PRx 2, Vena-Bal DHA, Venatal-FA, Verotin-BY, Verotin-GR, Vinacal, Vinacal B, Vinatal Forte, Vinate 90, Vinate AZ, Vinate AZ Extra, Vinate C, Vinate Calcium, Vinate Care, Vinate DHA, Vinate GT, Vinate IC, [place] II, [place] III, Vinate M Low Iron, Vinate One, Vinate PN, Vinate Ultra, Virt Nate, Virt-Advance, Virt-C DHA, Virt-Care One, Virt-PN, Virt-PN DHA, Virt-PN Plus, Virt-Select, Virt-Vite GT, VirtPrex, Vitafol PN, Vitafol Ultra, Vitafol-Nano Prenatal, Vitafol-OB, Vitafol-OB + DHA, Vitafol-OB and DHA, Vitafol-One, VitaMed MD Plus Rx, VitaNatal OB Plus DHA, vitaPearl Prenatal, VitaPhil, VitaPhil + DHA, VitaPhil + DHA 90, VitaPhil AiDE, VitaSpire, Viva CT, VIVA DHA, Vol-Tab Rx, VP CH Ultra, VP-CH-PNV, VP-GGR-B6 Prenatal, VP-HEME OB, VP-HEME OB+ DHA, VP-PNV-DHA, Zatean-CH, Zatean-Pn, Zatean-Pn DHA, Zatean-Pn Plus, Zingiber What should I tell my health care provider before I take this medicine? They need to know if you have any of these conditions: -bleeding or clotting disorder -history of anemia of any type -other chronic health condition -an unusual or allergic reaction to vitamins, minerals, other medicines, foods, dyes, or preservatives How should I use this medicine? Take this medicine by mouth with a glass of water. You can take it with or without food. If it upsets your stomach, take it with food. Chewable prenatal vitamin tablets may be chewed completely before swallowing. Follow the directions on the prescription label. The usual dose is taken once a day. Do not take your medicine more often than directed. Contact your pediatrician regarding the use of this medicine in children. Special care may be needed. This  medicine is intended for females who are pregnant, breast-feeding, or may become pregnant. Overdosage: If you think you have taken too much of this medicine contact a poison control center or emergency room at once. NOTE: This medicine is only for you. Do not share this medicine with others. What if I miss a dose? If you miss a dose, take it as soon as you can. If it is almost time for your next dose, take only that dose. Do not take double or extra doses. What may interact with this medicine? -alendronate -antacids -cefdinir -cefditoren -etidronate -fluoroquinolone antibiotics (examples: ciprofloxacin, gatifloxacin, levofloxacin) -ibandronate -levodopa -risedronate -tetracycline antibiotics (examples: doxycycline, minocycline, tetracycline) -thyroid hormones -warfarin This list may not describe all possible interactions. Give your health care provider a list of all the medicines, herbs, non-prescription drugs, or dietary supplements you use. Also tell them if you smoke, drink alcohol, or use illegal drugs. Some items may interact with your medicine. What should I watch for while using this medicine? See your health care professional for regular checks on your progress. Remember that vitamin and mineral supplements do not replace the need for good nutrition from a balanced diet. Stools commonly change color when vitamins and minerals are taken. Notify your health care professional if this change is alarming or accompanied by other symptoms, like abdominal pain. What side effects may I notice from receiving this medicine? Side  effects that you should report to your doctor or health care professional as soon as possible: -allergic reaction such as skin rash or difficulty breathing -vomiting Side effects that usually do not require medical attention (report to your doctor or health care professional if they continue or are bothersome): -nausea -stomach upset This list may not describe all  possible side effects. Call your doctor for medical advice about side effects. You may report side effects to FDA at 1-800-FDA-1088. Where should I keep my medicine? Keep out of the reach of children. Most vitamins and minerals should be stored at controlled room temperature. Check your specific product directions. Protect from heat and moisture. Throw away any unused medicine after the expiration date. NOTE: This sheet is a summary. It may not cover all possible information. If you have questions about this medicine, talk to your doctor, pharmacist, or health care provider.  2015, Elsevier/Gold Standard. (2013-05-26 08:34:45)

## 2014-04-03 LAB — T3, FREE: T3, Free: 3 pg/mL (ref 2.3–4.2)

## 2014-04-03 LAB — T4, FREE: Free T4: 1.14 ng/dL (ref 0.80–1.80)

## 2014-04-03 LAB — TSH: TSH: 0.814 u[IU]/mL (ref 0.350–4.500)

## 2014-04-05 LAB — OBSTETRIC PANEL
Antibody Screen: NEGATIVE
Basophils Absolute: 0 10*3/uL (ref 0.0–0.1)
Basophils Relative: 0 % (ref 0–1)
Eosinophils Absolute: 0.1 10*3/uL (ref 0.0–0.7)
Eosinophils Relative: 1 % (ref 0–5)
HCT: 34.5 % — ABNORMAL LOW (ref 36.0–46.0)
Hemoglobin: 11.6 g/dL — ABNORMAL LOW (ref 12.0–15.0)
Hepatitis B Surface Ag: NEGATIVE
Lymphocytes Relative: 25 % (ref 12–46)
Lymphs Abs: 2.2 10*3/uL (ref 0.7–4.0)
MCH: 30.3 pg (ref 26.0–34.0)
MCHC: 33.6 g/dL (ref 30.0–36.0)
MCV: 90.1 fL (ref 78.0–100.0)
MPV: 10.9 fL (ref 8.6–12.4)
Monocytes Absolute: 0.3 10*3/uL (ref 0.1–1.0)
Monocytes Relative: 3 % (ref 3–12)
Neutro Abs: 6.1 10*3/uL (ref 1.7–7.7)
Neutrophils Relative %: 71 % (ref 43–77)
Platelets: 210 10*3/uL (ref 150–400)
RBC: 3.83 MIL/uL — ABNORMAL LOW (ref 3.87–5.11)
RDW: 14.4 % (ref 11.5–15.5)
Rh Type: POSITIVE
Rubella: 4.33 Index — ABNORMAL HIGH (ref <0.90)
WBC: 8.6 10*3/uL (ref 4.0–10.5)

## 2014-04-07 ENCOUNTER — Ambulatory Visit (HOSPITAL_COMMUNITY)
Admission: RE | Admit: 2014-04-07 | Discharge: 2014-04-07 | Disposition: A | Discharge status: Home / Self Care | Payer: Self-pay | Source: Ambulatory Visit | Attending: Certified Nurse Midwife | Admitting: Certified Nurse Midwife

## 2014-04-07 ENCOUNTER — Ambulatory Visit (HOSPITAL_COMMUNITY)
Admission: RE | Admit: 2014-04-07 | Discharge: 2014-04-07 | Disposition: A | Discharge status: Home / Self Care | Payer: Medicaid Other | Source: Ambulatory Visit | Attending: Certified Nurse Midwife | Admitting: Certified Nurse Midwife

## 2014-04-07 DIAGNOSIS — Z3492 Encounter for supervision of normal pregnancy, unspecified, second trimester: Secondary | ICD-10-CM

## 2014-04-07 DIAGNOSIS — Z3490 Encounter for supervision of normal pregnancy, unspecified, unspecified trimester: Secondary | ICD-10-CM | POA: Insufficient documentation

## 2014-04-07 DIAGNOSIS — Z3A22 22 weeks gestation of pregnancy: Secondary | ICD-10-CM | POA: Insufficient documentation

## 2014-04-07 DIAGNOSIS — E049 Nontoxic goiter, unspecified: Secondary | ICD-10-CM | POA: Insufficient documentation

## 2014-04-07 DIAGNOSIS — R221 Localized swelling, mass and lump, neck: Secondary | ICD-10-CM | POA: Insufficient documentation

## 2014-04-08 ENCOUNTER — Ambulatory Visit (HOSPITAL_COMMUNITY): Payer: Medicaid Other

## 2014-04-14 ENCOUNTER — Encounter: Payer: Self-pay | Admitting: Certified Nurse Midwife

## 2014-04-14 DIAGNOSIS — O350XX0 Maternal care for (suspected) central nervous system malformation in fetus, not applicable or unspecified: Secondary | ICD-10-CM | POA: Insufficient documentation

## 2014-04-14 DIAGNOSIS — O3503X0 Maternal care for (suspected) central nervous system malformation or damage in fetus, choroid plexus cysts, not applicable or unspecified: Secondary | ICD-10-CM | POA: Insufficient documentation

## 2014-05-04 ENCOUNTER — Encounter: Payer: Medicaid Other | Admitting: Physician Assistant

## 2014-05-06 ENCOUNTER — Ambulatory Visit (INDEPENDENT_AMBULATORY_CARE_PROVIDER_SITE_OTHER): Payer: Self-pay | Admitting: Advanced Practice Midwife

## 2014-05-06 ENCOUNTER — Encounter: Payer: Self-pay | Admitting: Advanced Practice Midwife

## 2014-05-06 VITALS — BP 113/57 | HR 81 | Wt 170.1 lb

## 2014-05-06 DIAGNOSIS — Z8759 Personal history of other complications of pregnancy, childbirth and the puerperium: Secondary | ICD-10-CM

## 2014-05-06 DIAGNOSIS — E010 Iodine-deficiency related diffuse (endemic) goiter: Secondary | ICD-10-CM

## 2014-05-06 DIAGNOSIS — E049 Nontoxic goiter, unspecified: Secondary | ICD-10-CM

## 2014-05-06 DIAGNOSIS — Z8679 Personal history of other diseases of the circulatory system: Secondary | ICD-10-CM

## 2014-05-06 DIAGNOSIS — O0932 Supervision of pregnancy with insufficient antenatal care, second trimester: Secondary | ICD-10-CM

## 2014-05-06 LAB — POCT URINALYSIS DIP (DEVICE)
Bilirubin Urine: NEGATIVE
GLUCOSE, UA: NEGATIVE mg/dL
HGB URINE DIPSTICK: NEGATIVE
Leukocytes, UA: NEGATIVE
Nitrite: NEGATIVE
Protein, ur: NEGATIVE mg/dL
Specific Gravity, Urine: >=1.03 (ref 1.005–1.030)
Urobilinogen, UA: 0.2 mg/dL (ref 0.0–1.0)
pH: 6 (ref 5.0–8.0)

## 2014-05-06 NOTE — Progress Notes (Signed)
Pt will come back next week to do 1 hr gtt.

## 2014-05-06 NOTE — Progress Notes (Signed)
Patient elects [facility] , but wants to wait to make appointment after applies for [facility] financial assistance because pregnancy medicaid will not cover this.

## 2014-05-06 NOTE — Patient Instructions (Signed)
Third Trimester of Pregnancy The third trimester is from week 29 through week 42, months 7 through 9. The third trimester is a time when the fetus is growing rapidly. At the end of the ninth month, the fetus is about 20 inches in length and weighs 6-10 pounds.  BODY CHANGES Your body goes through many changes during pregnancy. The changes vary from woman to woman.   Your weight will continue to increase. You can expect to gain 25-35 pounds (11-16 kg) by the end of the pregnancy.  You may begin to get stretch marks on your hips, abdomen, and breasts.  You may urinate more often because the fetus is moving lower into your pelvis and pressing on your bladder.  You may develop or continue to have heartburn as a result of your pregnancy.  You may develop constipation because certain hormones are causing the muscles that push waste through your intestines to slow down.  You may develop hemorrhoids or swollen, bulging veins (varicose veins).  You may have pelvic pain because of the weight gain and pregnancy hormones relaxing your joints between the bones in your pelvis. Backaches may result from overexertion of the muscles supporting your posture.  You may have changes in your hair. These can include thickening of your hair, rapid growth, and changes in texture. Some women also have hair loss during or after pregnancy, or hair that feels dry or thin. Your hair will most likely return to normal after your baby is born.  Your breasts will continue to grow and be tender. A yellow discharge may leak from your breasts called colostrum.  Your belly button may stick out.  You may feel short of breath because of your expanding uterus.  You may notice the fetus "dropping," or moving lower in your abdomen.  You may have a bloody mucus discharge. This usually occurs a few days to a week before labor begins.  Your cervix becomes thin and soft (effaced) near your due date. WHAT TO EXPECT AT YOUR PRENATAL  EXAMS  You will have prenatal exams every 2 weeks until week 36. Then, you will have weekly prenatal exams. During a routine prenatal visit:  You will be weighed to make sure you and the fetus are growing normally.  Your blood pressure is taken.  Your abdomen will be measured to track your baby's growth.  The fetal heartbeat will be listened to.  Any test results from the previous visit will be discussed.  You may have a cervical check near your due date to see if you have effaced. At around 36 weeks, your caregiver will check your cervix. At the same time, your caregiver will also perform a test on the secretions of the vaginal tissue. This test is to determine if a type of bacteria, Group B streptococcus, is present. Your caregiver will explain this further. Your caregiver may ask you:  What your birth plan is.  How you are feeling.  If you are feeling the baby move.  If you have had any abnormal symptoms, such as leaking fluid, bleeding, severe headaches, or abdominal cramping.  If you have any questions. Other tests or screenings that may be performed during your third trimester include:  Blood tests that check for low iron levels (anemia).  Fetal testing to check the health, activity level, and growth of the fetus. Testing is done if you have certain medical conditions or if there are problems during the pregnancy. FALSE LABOR You may feel small, irregular contractions that   eventually go away. These are called Braxton Hicks contractions, or false labor. Contractions may last for hours, days, or even weeks before true labor sets in. If contractions come at regular intervals, intensify, or become painful, it is best to be seen by your caregiver.  SIGNS OF LABOR   Menstrual-like cramps.  Contractions that are 5 minutes apart or less.  Contractions that start on the top of the uterus and spread down to the lower abdomen and back.  A sense of increased pelvic pressure or back  pain.  A watery or bloody mucus discharge that comes from the vagina. If you have any of these signs before the 37th week of pregnancy, call your caregiver right away. You need to go to the hospital to get checked immediately. HOME CARE INSTRUCTIONS   Avoid all smoking, herbs, alcohol, and unprescribed drugs. These chemicals affect the formation and growth of the baby.  Follow your caregiver's instructions regarding medicine use. There are medicines that are either safe or unsafe to take during pregnancy.  Exercise only as directed by your caregiver. Experiencing uterine cramps is a good sign to stop exercising.  Continue to eat regular, healthy meals.  Wear a good support bra for breast tenderness.  Do not use hot tubs, steam rooms, or saunas.  Wear your seat belt at all times when driving.  Avoid raw meat, uncooked cheese, cat litter boxes, and soil used by cats. These carry germs that can cause birth defects in the baby.  Take your prenatal vitamins.  Try taking a stool softener (if your caregiver approves) if you develop constipation. Eat more high-fiber foods, such as fresh vegetables or fruit and whole grains. Drink plenty of fluids to keep your urine clear or pale yellow.  Take warm sitz baths to soothe any pain or discomfort caused by hemorrhoids. Use hemorrhoid cream if your caregiver approves.  If you develop varicose veins, wear support hose. Elevate your feet for 15 minutes, 3-4 times a day. Limit salt in your diet.  Avoid heavy lifting, wear low heal shoes, and practice good posture.  Rest a lot with your legs elevated if you have leg cramps or low back pain.  Visit your dentist if you have not gone during your pregnancy. Use a soft toothbrush to brush your teeth and be gentle when you floss.  A sexual relationship may be continued unless your caregiver directs you otherwise.  Do not travel far distances unless it is absolutely necessary and only with the approval  of your caregiver.  Take prenatal classes to understand, practice, and ask questions about the labor and delivery.  Make a trial run to the hospital.  Pack your hospital bag.  Prepare the baby's nursery.  Continue to go to all your prenatal visits as directed by your caregiver. SEEK MEDICAL CARE IF:  You are unsure if you are in labor or if your water has broken.  You have dizziness.  You have mild pelvic cramps, pelvic pressure, or nagging pain in your abdominal area.  You have persistent nausea, vomiting, or diarrhea.  You have a bad smelling vaginal discharge.  You have pain with urination. SEEK IMMEDIATE MEDICAL CARE IF:   You have a fever.  You are leaking fluid from your vagina.  You have spotting or bleeding from your vagina.  You have severe abdominal cramping or pain.  You have rapid weight loss or gain.  You have shortness of breath with chest pain.  You notice sudden or extreme swelling   of your face, hands, ankles, feet, or legs.  You have not felt your baby move in over an hour.  You have severe headaches that do not go away with medicine.  You have vision changes. Document Released: 01/23/2001 Document Revised: 02/03/2013 Document Reviewed: 04/01/2012 [employer] Patient Information 2015 [employer]. This information is not intended to replace advice given to you by your health care provider. Make sure you discuss any questions you have with your health care provider.  

## 2014-05-06 NOTE — Progress Notes (Signed)
Doing well. Cannot stay today for glucola. Will need referral to Endocrinology, but will apply for [facility] first. 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 TSH normal

## 2014-05-13 ENCOUNTER — Other Ambulatory Visit: Payer: Self-pay

## 2014-05-18 ENCOUNTER — Telehealth: Payer: Self-pay | Admitting: *Deleted

## 2014-05-18 NOTE — Telephone Encounter (Signed)
Called patient and left vm asking her to callback to be scheduled for lab appt.

## 2014-05-18 NOTE — Telephone Encounter (Signed)
-----   Message from Truett Mainland, DO sent at 05/18/2014 11:26 AM EDT ----- Regarding: 1 hr GTT  Pt needs 1 hr GTT  ----- Message -----    From: SYSTEM    Sent: 05/18/2014  12:04 AM      To: Truett Mainland, DO

## 2014-05-19 NOTE — Telephone Encounter (Signed)
Called patient stating we are calling because it appears she missed her appt for the 1 hr gtt. Patient states she ended up going out of town and that's why she missed it. Patient states she can do it at her next appt. Told patient to arrive at 1030. Patient verbalized understanding and had no questions

## 2014-05-28 ENCOUNTER — Ambulatory Visit (INDEPENDENT_AMBULATORY_CARE_PROVIDER_SITE_OTHER): Payer: Self-pay | Admitting: Certified Nurse Midwife

## 2014-05-28 VITALS — BP 122/62 | HR 80 | Temp 97.8°F | Wt 170.1 lb

## 2014-05-28 DIAGNOSIS — Z8759 Personal history of other complications of pregnancy, childbirth and the puerperium: Secondary | ICD-10-CM

## 2014-05-28 DIAGNOSIS — Z3A30 30 weeks gestation of pregnancy: Secondary | ICD-10-CM

## 2014-05-28 DIAGNOSIS — Z8679 Personal history of other diseases of the circulatory system: Secondary | ICD-10-CM

## 2014-05-28 LAB — POCT URINALYSIS DIP (DEVICE)
Bilirubin Urine: NEGATIVE
Glucose, UA: NEGATIVE mg/dL
Hgb urine dipstick: NEGATIVE
Ketones, ur: NEGATIVE mg/dL
Leukocytes, UA: NEGATIVE
Nitrite: NEGATIVE
Protein, ur: NEGATIVE mg/dL
Specific Gravity, Urine: 1.02 (ref 1.005–1.030)
Urobilinogen, UA: 1 mg/dL (ref 0.0–1.0)
pH: 6.5 (ref 5.0–8.0)

## 2014-05-28 LAB — CBC
HCT: 31 % — ABNORMAL LOW (ref 36.0–46.0)
Hemoglobin: 10.6 g/dL — ABNORMAL LOW (ref 12.0–15.0)
MCH: 29.4 pg (ref 26.0–34.0)
MCHC: 34.2 g/dL (ref 30.0–36.0)
MCV: 85.9 fL (ref 78.0–100.0)
MPV: 11.8 fL (ref 8.6–12.4)
Platelets: 175 10*3/uL (ref 150–400)
RBC: 3.61 MIL/uL — ABNORMAL LOW (ref 3.87–5.11)
RDW: 13.7 % (ref 11.5–15.5)
WBC: 6.7 10*3/uL (ref 4.0–10.5)

## 2014-05-28 NOTE — Addendum Note (Signed)
Addended by: Rutherford Nail E on: 05/28/2014 11:46 AM   Modules accepted: Orders

## 2014-05-28 NOTE — Progress Notes (Signed)
1hr gtt and labs today.  Tdap next visit.

## 2014-05-28 NOTE — Patient Instructions (Signed)
Third Trimester of Pregnancy The third trimester is from week 29 through week 42, months 7 through 9. The third trimester is a time when the fetus is growing rapidly. At the end of the ninth month, the fetus is about 20 inches in length and weighs 6-10 pounds.  BODY CHANGES Your body goes through many changes during pregnancy. The changes vary from woman to woman.   Your weight will continue to increase. You can expect to gain 25-35 pounds (11-16 kg) by the end of the pregnancy.  You may begin to get stretch marks on your hips, abdomen, and breasts.  You may urinate more often because the fetus is moving lower into your pelvis and pressing on your bladder.  You may develop or continue to have heartburn as a result of your pregnancy.  You may develop constipation because certain hormones are causing the muscles that push waste through your intestines to slow down.  You may develop hemorrhoids or swollen, bulging veins (varicose veins).  You may have pelvic pain because of the weight gain and pregnancy hormones relaxing your joints between the bones in your pelvis. Backaches may result from overexertion of the muscles supporting your posture.  You may have changes in your hair. These can include thickening of your hair, rapid growth, and changes in texture. Some women also have hair loss during or after pregnancy, or hair that feels dry or thin. Your hair will most likely return to normal after your baby is born.  Your breasts will continue to grow and be tender. A yellow discharge may leak from your breasts called colostrum.  Your belly button may stick out.  You may feel short of breath because of your expanding uterus.  You may notice the fetus "dropping," or moving lower in your abdomen.  You may have a bloody mucus discharge. This usually occurs a few days to a week before labor begins.  Your cervix becomes thin and soft (effaced) near your due date. WHAT TO EXPECT AT YOUR PRENATAL  EXAMS  You will have prenatal exams every 2 weeks until week 36. Then, you will have weekly prenatal exams. During a routine prenatal visit:  You will be weighed to make sure you and the fetus are growing normally.  Your blood pressure is taken.  Your abdomen will be measured to track your baby's growth.  The fetal heartbeat will be listened to.  Any test results from the previous visit will be discussed.  You may have a cervical check near your due date to see if you have effaced. At around 36 weeks, your caregiver will check your cervix. At the same time, your caregiver will also perform a test on the secretions of the vaginal tissue. This test is to determine if a type of bacteria, Group B streptococcus, is present. Your caregiver will explain this further. Your caregiver may ask you:  What your birth plan is.  How you are feeling.  If you are feeling the baby move.  If you have had any abnormal symptoms, such as leaking fluid, bleeding, severe headaches, or abdominal cramping.  If you have any questions. Other tests or screenings that may be performed during your third trimester include:  Blood tests that check for low iron levels (anemia).  Fetal testing to check the health, activity level, and growth of the fetus. Testing is done if you have certain medical conditions or if there are problems during the pregnancy. FALSE LABOR You may feel small, irregular contractions that   eventually go away. These are called Braxton Hicks contractions, or false labor. Contractions may last for hours, days, or even weeks before true labor sets in. If contractions come at regular intervals, intensify, or become painful, it is best to be seen by your caregiver.  SIGNS OF LABOR   Menstrual-like cramps.  Contractions that are 5 minutes apart or less.  Contractions that start on the top of the uterus and spread down to the lower abdomen and back.  A sense of increased pelvic pressure or back  pain.  A watery or bloody mucus discharge that comes from the vagina. If you have any of these signs before the 37th week of pregnancy, call your caregiver right away. You need to go to the hospital to get checked immediately. HOME CARE INSTRUCTIONS   Avoid all smoking, herbs, alcohol, and unprescribed drugs. These chemicals affect the formation and growth of the baby.  Follow your caregiver's instructions regarding medicine use. There are medicines that are either safe or unsafe to take during pregnancy.  Exercise only as directed by your caregiver. Experiencing uterine cramps is a good sign to stop exercising.  Continue to eat regular, healthy meals.  Wear a good support bra for breast tenderness.  Do not use hot tubs, steam rooms, or saunas.  Wear your seat belt at all times when driving.  Avoid raw meat, uncooked cheese, cat litter boxes, and soil used by cats. These carry germs that can cause birth defects in the baby.  Take your prenatal vitamins.  Try taking a stool softener (if your caregiver approves) if you develop constipation. Eat more high-fiber foods, such as fresh vegetables or fruit and whole grains. Drink plenty of fluids to keep your urine clear or pale yellow.  Take warm sitz baths to soothe any pain or discomfort caused by hemorrhoids. Use hemorrhoid cream if your caregiver approves.  If you develop varicose veins, wear support hose. Elevate your feet for 15 minutes, 3-4 times a day. Limit salt in your diet.  Avoid heavy lifting, wear low heal shoes, and practice good posture.  Rest a lot with your legs elevated if you have leg cramps or low back pain.  Visit your dentist if you have not gone during your pregnancy. Use a soft toothbrush to brush your teeth and be gentle when you floss.  A sexual relationship may be continued unless your caregiver directs you otherwise.  Do not travel far distances unless it is absolutely necessary and only with the approval  of your caregiver.  Take prenatal classes to understand, practice, and ask questions about the labor and delivery.  Make a trial run to the hospital.  Pack your hospital bag.  Prepare the baby's nursery.  Continue to go to all your prenatal visits as directed by your caregiver. SEEK MEDICAL CARE IF:  You are unsure if you are in labor or if your water has broken.  You have dizziness.  You have mild pelvic cramps, pelvic pressure, or nagging pain in your abdominal area.  You have persistent nausea, vomiting, or diarrhea.  You have a bad smelling vaginal discharge.  You have pain with urination. SEEK IMMEDIATE MEDICAL CARE IF:   You have a fever.  You are leaking fluid from your vagina.  You have spotting or bleeding from your vagina.  You have severe abdominal cramping or pain.  You have rapid weight loss or gain.  You have shortness of breath with chest pain.  You notice sudden or extreme swelling   of your face, hands, ankles, feet, or legs.  You have not felt your baby move in over an hour.  You have severe headaches that do not go away with medicine.  You have vision changes. Document Released: 01/23/2001 Document Revised: 02/03/2013 Document Reviewed: 04/01/2012 [employer] Patient Information 2015 [employer]. This information is not intended to replace advice given to you by your health care provider. Make sure you discuss any questions you have with your health care provider.  

## 2014-05-28 NOTE — Progress Notes (Signed)
Doing well. No questions today. Getting 1 hour GTT today

## 2014-05-29 LAB — GLUCOSE TOLERANCE, 1 HOUR (50G) W/O FASTING: Glucose, 1 Hour GTT: 85 mg/dL (ref 70–140)

## 2014-05-29 LAB — RPR

## 2014-05-29 LAB — HIV ANTIBODY (ROUTINE TESTING W REFLEX): HIV 1&2 Ab, 4th Generation: NONREACTIVE

## 2014-06-11 ENCOUNTER — Encounter: Payer: Self-pay | Admitting: Advanced Practice Midwife

## 2014-06-15 ENCOUNTER — Ambulatory Visit (INDEPENDENT_AMBULATORY_CARE_PROVIDER_SITE_OTHER): Payer: Self-pay | Admitting: Obstetrics and Gynecology

## 2014-06-15 VITALS — BP 110/58 | HR 64 | Temp 97.6°F | Wt 165.7 lb

## 2014-06-15 DIAGNOSIS — O0932 Supervision of pregnancy with insufficient antenatal care, second trimester: Secondary | ICD-10-CM

## 2014-06-15 DIAGNOSIS — O0943 Supervision of pregnancy with grand multiparity, third trimester: Secondary | ICD-10-CM

## 2014-06-15 DIAGNOSIS — O0933 Supervision of pregnancy with insufficient antenatal care, third trimester: Secondary | ICD-10-CM

## 2014-06-15 DIAGNOSIS — O4703 False labor before 37 completed weeks of gestation, third trimester: Secondary | ICD-10-CM

## 2014-06-15 LAB — POCT URINALYSIS DIP (DEVICE)
Bilirubin Urine: NEGATIVE
Glucose, UA: NEGATIVE mg/dL
Hgb urine dipstick: NEGATIVE
KETONES UR: NEGATIVE mg/dL
Leukocytes, UA: NEGATIVE
Nitrite: NEGATIVE
PH: 6.5 (ref 5.0–8.0)
PROTEIN: NEGATIVE mg/dL
SPECIFIC GRAVITY, URINE: 1.01 (ref 1.005–1.030)
Urobilinogen, UA: 0.2 mg/dL (ref 0.0–1.0)

## 2014-06-15 NOTE — Patient Instructions (Addendum)
Third Trimester of Pregnancy The third trimester is from week 29 through week 42, months 7 through 9. The third trimester is a time when the fetus is growing rapidly. At the end of the ninth month, the fetus is about 20 inches in length and weighs 6-10 pounds.  BODY CHANGES Your body goes through many changes during pregnancy. The changes vary from woman to woman.   Your weight will continue to increase. You can expect to gain 25-35 pounds (11-16 kg) by the end of the pregnancy.  You may begin to get stretch marks on your hips, abdomen, and breasts.  You may urinate more often because the fetus is moving lower into your pelvis and pressing on your bladder.  You may develop or continue to have heartburn as a result of your pregnancy.  You may develop constipation because certain hormones are causing the muscles that push waste through your intestines to slow down.  You may develop hemorrhoids or swollen, bulging veins (varicose veins).  You may have pelvic pain because of the weight gain and pregnancy hormones relaxing your joints between the bones in your pelvis. Backaches may result from overexertion of the muscles supporting your posture.  You may have changes in your hair. These can include thickening of your hair, rapid growth, and changes in texture. Some women also have hair loss during or after pregnancy, or hair that feels dry or thin. Your hair will most likely return to normal after your baby is born.  Your breasts will continue to grow and be tender. A yellow discharge may leak from your breasts called colostrum.  Your belly button may stick out.  You may feel short of breath because of your expanding uterus.  You may notice the fetus "dropping," or moving lower in your abdomen.  You may have a bloody mucus discharge. This usually occurs a few days to a week before labor begins.  Your cervix becomes thin and soft (effaced) near your due date. WHAT TO EXPECT AT YOUR PRENATAL  EXAMS  You will have prenatal exams every 2 weeks until week 36. Then, you will have weekly prenatal exams. During a routine prenatal visit:  You will be weighed to make sure you and the fetus are growing normally.  Your blood pressure is taken.  Your abdomen will be measured to track your baby's growth.  The fetal heartbeat will be listened to.  Any test results from the previous visit will be discussed.  You may have a cervical check near your due date to see if you have effaced. At around 36 weeks, your caregiver will check your cervix. At the same time, your caregiver will also perform a test on the secretions of the vaginal tissue. This test is to determine if a type of bacteria, Group B streptococcus, is present. Your caregiver will explain this further. Your caregiver may ask you:  What your birth plan is.  How you are feeling.  If you are feeling the baby move.  If you have had any abnormal symptoms, such as leaking fluid, bleeding, severe headaches, or abdominal cramping.  If you have any questions. Other tests or screenings that may be performed during your third trimester include:  Blood tests that check for low iron levels (anemia).  Fetal testing to check the health, activity level, and growth of the fetus. Testing is done if you have certain medical conditions or if there are problems during the pregnancy. FALSE LABOR You may feel small, irregular contractions that   eventually go away. These are called Braxton Hicks contractions, or false labor. Contractions may last for hours, days, or even weeks before true labor sets in. If contractions come at regular intervals, intensify, or become painful, it is best to be seen by your caregiver.  SIGNS OF LABOR   Menstrual-like cramps.  Contractions that are 5 minutes apart or less.  Contractions that start on the top of the uterus and spread down to the lower abdomen and back.  A sense of increased pelvic pressure or back  pain.  A watery or bloody mucus discharge that comes from the vagina. If you have any of these signs before the 37th week of pregnancy, call your caregiver right away. You need to go to the hospital to get checked immediately. HOME CARE INSTRUCTIONS   Avoid all smoking, herbs, alcohol, and unprescribed drugs. These chemicals affect the formation and growth of the baby.  Follow your caregiver's instructions regarding medicine use. There are medicines that are either safe or unsafe to take during pregnancy.  Exercise only as directed by your caregiver. Experiencing uterine cramps is a good sign to stop exercising.  Continue to eat regular, healthy meals.  Wear a good support bra for breast tenderness.  Do not use hot tubs, steam rooms, or saunas.  Wear your seat belt at all times when driving.  Avoid raw meat, uncooked cheese, cat litter boxes, and soil used by cats. These carry germs that can cause birth defects in the baby.  Take your prenatal vitamins.  Try taking a stool softener (if your caregiver approves) if you develop constipation. Eat more high-fiber foods, such as fresh vegetables or fruit and whole grains. Drink plenty of fluids to keep your urine clear or pale yellow.  Take warm sitz baths to soothe any pain or discomfort caused by hemorrhoids. Use hemorrhoid cream if your caregiver approves.  If you develop varicose veins, wear support hose. Elevate your feet for 15 minutes, 3-4 times a day. Limit salt in your diet.  Avoid heavy lifting, wear low heal shoes, and practice good posture.  Rest a lot with your legs elevated if you have leg cramps or low back pain.  Visit your dentist if you have not gone during your pregnancy. Use a soft toothbrush to brush your teeth and be gentle when you floss.  A sexual relationship may be continued unless your caregiver directs you otherwise.  Do not travel far distances unless it is absolutely necessary and only with the approval  of your caregiver.  Take prenatal classes to understand, practice, and ask questions about the labor and delivery.  Make a trial run to the hospital.  Pack your hospital bag.  Prepare the baby's nursery.  Continue to go to all your prenatal visits as directed by your caregiver. SEEK MEDICAL CARE IF:  You are unsure if you are in labor or if your water has broken.  You have dizziness.  You have mild pelvic cramps, pelvic pressure, or nagging pain in your abdominal area.  You have persistent nausea, vomiting, or diarrhea.  You have a bad smelling vaginal discharge.  You have pain with urination. SEEK IMMEDIATE MEDICAL CARE IF:   You have a fever.  You are leaking fluid from your vagina.  You have spotting or bleeding from your vagina.  You have severe abdominal cramping or pain.  You have rapid weight loss or gain.  You have shortness of breath with chest pain.  You notice sudden or extreme swelling   of your face, hands, ankles, feet, or legs.  You have not felt your baby move in over an hour.  You have severe headaches that do not go away with medicine.  You have vision changes. Document Released: 01/23/2001 Document Revised: 02/03/2013 Document Reviewed: 04/01/2012 [facility] Patient Information 2015 [place], [place]. This information is not intended to replace advice given to you by your health care provider. Make sure you discuss any questions you have with your health care provider. Contraception Choices Contraception (birth control) is the use of any methods or devices to prevent pregnancy. Below are some methods to help avoid pregnancy. HORMONAL METHODS   Contraceptive implant. This is a thin, plastic tube containing progesterone hormone. It does not contain estrogen hormone. Your health care provider inserts the tube in the inner part of the upper arm. The tube can remain in place for up to 3 years. After 3 years, the implant must be removed. The implant prevents  the ovaries from releasing an egg (ovulation), thickens the cervical mucus to prevent sperm from entering the uterus, and thins the lining of the inside of the uterus.  Progesterone-only injections. These injections are given every 3 months by your health care provider to prevent pregnancy. This synthetic progesterone hormone stops the ovaries from releasing eggs. It also thickens cervical mucus and changes the uterine lining. This makes it harder for sperm to survive in the uterus.  Birth control pills. These pills contain estrogen and progesterone hormone. They work by preventing the ovaries from releasing eggs (ovulation). They also cause the cervical mucus to thicken, preventing the sperm from entering the uterus. Birth control pills are prescribed by a health care provider.Birth control pills can also be used to treat heavy periods.  Minipill. This type of birth control pill contains only the progesterone hormone. They are taken every day of each month and must be prescribed by your health care provider.  Birth control patch. The patch contains hormones similar to those in birth control pills. It must be changed once a week and is prescribed by a health care provider.  Vaginal ring. The ring contains hormones similar to those in birth control pills. It is left in the vagina for 3 weeks, removed for 1 week, and then a new one is put back in place. The patient must be comfortable inserting and removing the ring from the vagina.A health care provider's prescription is necessary.  Emergency contraception. Emergency contraceptives prevent pregnancy after unprotected sexual intercourse. This pill can be taken right after sex or up to 5 days after unprotected sex. It is most effective the sooner you take the pills after having sexual intercourse. Most emergency contraceptive pills are available without a prescription. Check with your pharmacist. Do not use emergency contraception as your only form of  birth control. BARRIER METHODS   Female condom. This is a thin sheath (latex or rubber) that is worn over the penis during sexual intercourse. It can be used with spermicide to increase effectiveness.  Female condom. This is a soft, loose-fitting sheath that is put into the vagina before sexual intercourse.  Diaphragm. This is a soft, latex, dome-shaped barrier that must be fitted by a health care provider. It is inserted into the vagina, along with a spermicidal jelly. It is inserted before intercourse. The diaphragm should be left in the vagina for 6 to 8 hours after intercourse.  Cervical cap. This is a round, soft, latex or plastic cup that fits over the cervix and must be fitted  by a health care provider. The cap can be left in place for up to 48 hours after intercourse.  Sponge. This is a soft, circular piece of polyurethane foam. The sponge has spermicide in it. It is inserted into the vagina after wetting it and before sexual intercourse.  Spermicides. These are chemicals that kill or block sperm from entering the cervix and uterus. They come in the form of creams, jellies, suppositories, foam, or tablets. They do not require a prescription. They are inserted into the vagina with an applicator before having sexual intercourse. The process must be repeated every time you have sexual intercourse. INTRAUTERINE CONTRACEPTION  Intrauterine device (IUD). This is a T-shaped device that is put in a woman's uterus during a menstrual period to prevent pregnancy. There are 2 types:  Copper IUD. This type of IUD is wrapped in copper wire and is placed inside the uterus. Copper makes the uterus and fallopian tubes produce a fluid that kills sperm. It can stay in place for 10 years.  Hormone IUD. This type of IUD contains the hormone progestin (synthetic progesterone). The hormone thickens the cervical mucus and prevents sperm from entering the uterus, and it also thins the uterine lining to prevent  implantation of a fertilized egg. The hormone can weaken or kill the sperm that get into the uterus. It can stay in place for 3-5 years, depending on which type of IUD is used. PERMANENT METHODS OF CONTRACEPTION  Female tubal ligation. This is when the woman's fallopian tubes are surgically sealed, tied, or blocked to prevent the egg from traveling to the uterus.  Hysteroscopic sterilization. This involves placing a small coil or insert into each fallopian tube. Your doctor uses a technique called hysteroscopy to do the procedure. The device causes scar tissue to form. This results in permanent blockage of the fallopian tubes, so the sperm cannot fertilize the egg. It takes about 3 months after the procedure for the tubes to become blocked. You must use another form of birth control for these 3 months.  Female sterilization. This is when the female has the tubes that carry sperm tied off (vasectomy).This blocks sperm from entering the vagina during sexual intercourse. After the procedure, the man can still ejaculate fluid (semen). NATURAL PLANNING METHODS  Natural family planning. This is not having sexual intercourse or using a barrier method (condom, diaphragm, cervical cap) on days the woman could become pregnant.  Calendar method. This is keeping track of the length of each menstrual cycle and identifying when you are fertile.  Ovulation method. This is avoiding sexual intercourse during ovulation.  Symptothermal method. This is avoiding sexual intercourse during ovulation, using a thermometer and ovulation symptoms.  Post-ovulation method. This is timing sexual intercourse after you have ovulated. Regardless of which type or method of contraception you choose, it is important that you use condoms to protect against the transmission of sexually transmitted infections (STIs). Talk with your health care provider about which form of contraception is most appropriate for you. Document Released:  01/29/2005 Document Revised: 02/03/2013 Document Reviewed: 07/24/2012 [facility] Patient Information 2015 [place], [place]. This information is not intended to replace advice given to you by your health care provider. Make sure you discuss any questions you have with your health care provider.

## 2014-06-15 NOTE — Progress Notes (Signed)
Normal GCT. Borderline hgb> discussed high iron foods and PPH risk with [facility]. Plans condoms> info on LARC.  Plans to see [facility] for F/U of thyroid goiter when her [facility] comes through. Denies sx hypo/hyperthyroid and TFTs normal 04/02/14.

## 2014-06-15 NOTE — Progress Notes (Signed)
Discussed tdap-- patient to get at next visit.

## 2014-06-29 ENCOUNTER — Ambulatory Visit (INDEPENDENT_AMBULATORY_CARE_PROVIDER_SITE_OTHER): Payer: Self-pay | Admitting: Family

## 2014-06-29 VITALS — BP 119/64 | HR 69 | Wt 166.7 lb

## 2014-06-29 DIAGNOSIS — O0932 Supervision of pregnancy with insufficient antenatal care, second trimester: Secondary | ICD-10-CM

## 2014-06-29 DIAGNOSIS — E049 Nontoxic goiter, unspecified: Secondary | ICD-10-CM

## 2014-06-29 DIAGNOSIS — E010 Iodine-deficiency related diffuse (endemic) goiter: Secondary | ICD-10-CM

## 2014-06-29 DIAGNOSIS — O350XX1 Maternal care for (suspected) central nervous system malformation in fetus, fetus 1: Secondary | ICD-10-CM

## 2014-06-29 DIAGNOSIS — IMO0001 Reserved for inherently not codable concepts without codable children: Secondary | ICD-10-CM

## 2014-06-29 LAB — POCT URINALYSIS DIP (DEVICE)
Bilirubin Urine: NEGATIVE
Glucose, UA: NEGATIVE mg/dL
HGB URINE DIPSTICK: NEGATIVE
Ketones, ur: NEGATIVE mg/dL
NITRITE: NEGATIVE
PROTEIN: NEGATIVE mg/dL
Specific Gravity, Urine: 1.01 (ref 1.005–1.030)
Urobilinogen, UA: 0.2 mg/dL (ref 0.0–1.0)
pH: 6.5 (ref 5.0–8.0)

## 2014-06-29 LAB — TSH: TSH: 1.039 u[IU]/mL (ref 0.350–4.500)

## 2014-06-29 NOTE — Progress Notes (Signed)
Reports doing well.  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  Obtain TSH today.

## 2014-07-02 ENCOUNTER — Ambulatory Visit (HOSPITAL_COMMUNITY)
Admission: RE | Admit: 2014-07-02 | Discharge: 2014-07-02 | Disposition: A | Discharge status: Home / Self Care | Payer: Medicaid Other | Source: Ambulatory Visit | Attending: Family | Admitting: Family

## 2014-07-02 DIAGNOSIS — O350XX0 Maternal care for (suspected) central nervous system malformation in fetus, not applicable or unspecified: Secondary | ICD-10-CM | POA: Diagnosis not present

## 2014-07-02 DIAGNOSIS — IMO0001 Reserved for inherently not codable concepts without codable children: Secondary | ICD-10-CM

## 2014-07-06 ENCOUNTER — Ambulatory Visit (INDEPENDENT_AMBULATORY_CARE_PROVIDER_SITE_OTHER): Payer: Self-pay | Admitting: Advanced Practice Midwife

## 2014-07-06 VITALS — BP 120/67 | HR 70 | Temp 97.9°F | Wt 166.4 lb

## 2014-07-06 DIAGNOSIS — O0932 Supervision of pregnancy with insufficient antenatal care, second trimester: Secondary | ICD-10-CM

## 2014-07-06 DIAGNOSIS — E049 Nontoxic goiter, unspecified: Secondary | ICD-10-CM

## 2014-07-06 DIAGNOSIS — O0933 Supervision of pregnancy with insufficient antenatal care, third trimester: Secondary | ICD-10-CM

## 2014-07-06 DIAGNOSIS — O358XX1 Maternal care for other (suspected) fetal abnormality and damage, fetus 1: Secondary | ICD-10-CM

## 2014-07-06 DIAGNOSIS — Z3483 Encounter for supervision of other normal pregnancy, third trimester: Secondary | ICD-10-CM

## 2014-07-06 DIAGNOSIS — O3503X1 Maternal care for (suspected) central nervous system malformation or damage in fetus, choroid plexus cysts, fetus 1: Secondary | ICD-10-CM

## 2014-07-06 DIAGNOSIS — E010 Iodine-deficiency related diffuse (endemic) goiter: Secondary | ICD-10-CM

## 2014-07-06 DIAGNOSIS — O350XX1 Maternal care for (suspected) central nervous system malformation in fetus, fetus 1: Secondary | ICD-10-CM

## 2014-07-06 LAB — POCT URINALYSIS DIP (DEVICE)
Bilirubin Urine: NEGATIVE
GLUCOSE, UA: NEGATIVE mg/dL
Hgb urine dipstick: NEGATIVE
Ketones, ur: NEGATIVE mg/dL
Leukocytes, UA: NEGATIVE
Nitrite: NEGATIVE
PROTEIN: NEGATIVE mg/dL
Specific Gravity, Urine: >=1.03 (ref 1.005–1.030)
UROBILINOGEN UA: 4 mg/dL — AB (ref 0.0–1.0)
pH: 6 (ref 5.0–8.0)

## 2014-07-06 NOTE — Progress Notes (Signed)
Tiny bilat CPCs still present at 35 week. Risk of T-18 negligible per MFM. No F/U needed. [facility]SB at [place]. RE: goiter/endocrinolgy referal, pt maxed out on [facility] financial assistance. Can't afford [facility]. Per Dr. Nehemiah Settle will try to refer patient to Dr. Dorris Fetch in [place]. ?May take pregnancy Medicaid.

## 2014-07-06 NOTE — Progress Notes (Signed)
Urobilinogen: 4.0

## 2014-07-06 NOTE — Patient Instructions (Signed)
Braxton Hicks Contractions °Contractions of the uterus can occur throughout pregnancy. Contractions are not always a sign that you are in labor.  °WHAT ARE BRAXTON HICKS CONTRACTIONS?  °Contractions that occur before labor are called Braxton Hicks contractions, or false labor. Toward the end of pregnancy (32-34 weeks), these contractions can develop more often and may become more forceful. This is not true labor because these contractions do not result in opening (dilatation) and thinning of the cervix. They are sometimes difficult to tell apart from true labor because these contractions can be forceful and people have different pain tolerances. You should not feel embarrassed if you go to the hospital with false labor. Sometimes, the only way to tell if you are in true labor is for your health care provider to look for changes in the cervix. °If there are no prenatal problems or other health problems associated with the pregnancy, it is completely safe to be sent home with false labor and await the onset of true labor. °HOW CAN YOU TELL THE DIFFERENCE BETWEEN TRUE AND FALSE LABOR? °False Labor °· The contractions of false labor are usually shorter and not as hard as those of true labor.   °· The contractions are usually irregular.   °· The contractions are often felt in the front of the lower abdomen and in the groin.   °· The contractions may go away when you walk around or change positions while lying down.   °· The contractions get weaker and are shorter lasting as time goes on.   °· The contractions do not usually become progressively stronger, regular, and closer together as with true labor.   °True Labor °1. Contractions in true labor last 30-70 seconds, become very regular, usually become more intense, and increase in frequency.   °2. The contractions do not go away with walking.   °3. The discomfort is usually felt in the top of the uterus and spreads to the lower abdomen and low back.   °4. True labor can  be determined by your health care provider with an exam. This will show that the cervix is dilating and getting thinner.   °WHAT TO REMEMBER °· Keep up with your usual exercises and follow other instructions given by your health care provider.   °· Take medicines as directed by your health care provider.   °· Keep your regular prenatal appointments.   °· Eat and drink lightly if you think you are going into labor.   °· If Braxton Hicks contractions are making you uncomfortable:   °· Change your position from lying down or resting to walking, or from walking to resting.   °· Sit and rest in a tub of warm water.   °· Drink 2-3 glasses of water. Dehydration may cause these contractions.   °· Do slow and deep breathing several times an hour.   °WHEN SHOULD I SEEK IMMEDIATE MEDICAL CARE? °Seek immediate medical care if: °· Your contractions become stronger, more regular, and closer together.   °· You have fluid leaking or gushing from your vagina.   °· You have a fever.   °· You pass blood-tinged mucus.   °· You have vaginal bleeding.   °· You have continuous abdominal pain.   °· You have low back pain that you never had before.   °· You feel your baby's head pushing down and causing pelvic pressure.   °· Your baby is not moving as much as it used to.   °Document Released: 01/29/2005 Document Revised: 02/03/2013 Document Reviewed: 11/10/2012 °[employer]® Patient Information ©2015 [employer]. This information is not intended to replace advice given to you by your health care   provider. Make sure you discuss any questions you have with your health care provider. ° °Fetal Movement Counts °Patient Name: __________________________________________________ Patient Due Date: ____________________ °Performing a fetal movement count is highly recommended in high-risk pregnancies, but it is good for every pregnant woman to do. Your health care provider may ask you to start counting fetal movements at 28 weeks of the pregnancy. Fetal  movements often increase: °· After eating a full meal. °· After physical activity. °· After eating or drinking something sweet or cold. °· At rest. °Pay attention to when you feel the baby is most active. This will help you notice a pattern of your baby's sleep and wake cycles and what factors contribute to an increase in fetal movement. It is important to perform a fetal movement count at the same time each day when your baby is normally most active.  °HOW TO COUNT FETAL MOVEMENTS °5. Find a quiet and comfortable area to sit or lie down on your left side. Lying on your left side provides the best blood and oxygen circulation to your baby. °6. Write down the day and time on a sheet of paper or in a journal. °7. Start counting kicks, flutters, swishes, rolls, or jabs in a 2-hour period. You should feel at least 10 movements within 2 hours. °8. If you do not feel 10 movements in 2 hours, wait 2-3 hours and count again. Look for a change in the pattern or not enough counts in 2 hours. °SEEK MEDICAL CARE IF: °· You feel less than 10 counts in 2 hours, tried twice. °· There is no movement in over an hour. °· The pattern is changing or taking longer each day to reach 10 counts in 2 hours. °· You feel the baby is not moving as he or she usually does. °Date: ____________ Movements: ____________ Start time: ____________ Finish time: ____________  °Date: ____________ Movements: ____________ Start time: ____________ Finish time: ____________ °Date: ____________ Movements: ____________ Start time: ____________ Finish time: ____________ °Date: ____________ Movements: ____________ Start time: ____________ Finish time: ____________ °Date: ____________ Movements: ____________ Start time: ____________ Finish time: ____________ °Date: ____________ Movements: ____________ Start time: ____________ Finish time: ____________ °Date: ____________ Movements: ____________ Start time: ____________ Finish time: ____________ °Date: ____________  Movements: ____________ Start time: ____________ Finish time: ____________  °Date: ____________ Movements: ____________ Start time: ____________ Finish time: ____________ °Date: ____________ Movements: ____________ Start time: ____________ Finish time: ____________ °Date: ____________ Movements: ____________ Start time: ____________ Finish time: ____________ °Date: ____________ Movements: ____________ Start time: ____________ Finish time: ____________ °Date: ____________ Movements: ____________ Start time: ____________ Finish time: ____________ °Date: ____________ Movements: ____________ Start time: ____________ Finish time: ____________ °Date: ____________ Movements: ____________ Start time: ____________ Finish time: ____________  °Date: ____________ Movements: ____________ Start time: ____________ Finish time: ____________ °Date: ____________ Movements: ____________ Start time: ____________ Finish time: ____________ °Date: ____________ Movements: ____________ Start time: ____________ Finish time: ____________ °Date: ____________ Movements: ____________ Start time: ____________ Finish time: ____________ °Date: ____________ Movements: ____________ Start time: ____________ Finish time: ____________ °Date: ____________ Movements: ____________ Start time: ____________ Finish time: ____________ °Date: ____________ Movements: ____________ Start time: ____________ Finish time: ____________  °Date: ____________ Movements: ____________ Start time: ____________ Finish time: ____________ °Date: ____________ Movements: ____________ Start time: ____________ Finish time: ____________ °Date: ____________ Movements: ____________ Start time: ____________ Finish time: ____________ °Date: ____________ Movements: ____________ Start time: ____________ Finish time: ____________ °Date: ____________ Movements: ____________ Start time: ____________ Finish time: ____________ °Date: ____________ Movements: ____________ Start time:  ____________ Finish time: ____________ °Date: ____________ Movements:   ____________ Start time: ____________ Finish time: ____________  °Date: ____________ Movements: ____________ Start time: ____________ Finish time: ____________ °Date: ____________ Movements: ____________ Start time: ____________ Finish time: ____________ °Date: ____________ Movements: ____________ Start time: ____________ Finish time: ____________ °Date: ____________ Movements: ____________ Start time: ____________ Finish time: ____________ °Date: ____________ Movements: ____________ Start time: ____________ Finish time: ____________ °Date: ____________ Movements: ____________ Start time: ____________ Finish time: ____________ °Date: ____________ Movements: ____________ Start time: ____________ Finish time: ____________  °Date: ____________ Movements: ____________ Start time: ____________ Finish time: ____________ °Date: ____________ Movements: ____________ Start time: ____________ Finish time: ____________ °Date: ____________ Movements: ____________ Start time: ____________ Finish time: ____________ °Date: ____________ Movements: ____________ Start time: ____________ Finish time: ____________ °Date: ____________ Movements: ____________ Start time: ____________ Finish time: ____________ °Date: ____________ Movements: ____________ Start time: ____________ Finish time: ____________ °Date: ____________ Movements: ____________ Start time: ____________ Finish time: ____________  °Date: ____________ Movements: ____________ Start time: ____________ Finish time: ____________ °Date: ____________ Movements: ____________ Start time: ____________ Finish time: ____________ °Date: ____________ Movements: ____________ Start time: ____________ Finish time: ____________ °Date: ____________ Movements: ____________ Start time: ____________ Finish time: ____________ °Date: ____________ Movements: ____________ Start time: ____________ Finish time: ____________ °Date:  ____________ Movements: ____________ Start time: ____________ Finish time: ____________ °Date: ____________ Movements: ____________ Start time: ____________ Finish time: ____________  °Date: ____________ Movements: ____________ Start time: ____________ Finish time: ____________ °Date: ____________ Movements: ____________ Start time: ____________ Finish time: ____________ °Date: ____________ Movements: ____________ Start time: ____________ Finish time: ____________ °Date: ____________ Movements: ____________ Start time: ____________ Finish time: ____________ °Date: ____________ Movements: ____________ Start time: ____________ Finish time: ____________ °Date: ____________ Movements: ____________ Start time: ____________ Finish time: ____________ °Document Released: 02/28/2006 Document Revised: 06/15/2013 Document Reviewed: 11/26/2011 °[employer]® Patient Information ©2015 [employer]. This information is not intended to replace advice given to you by your health care provider. Make sure you discuss any questions you have with your health care provider. ° °

## 2014-07-13 ENCOUNTER — Telehealth: Payer: Self-pay

## 2014-07-13 ENCOUNTER — Ambulatory Visit (INDEPENDENT_AMBULATORY_CARE_PROVIDER_SITE_OTHER): Payer: Self-pay | Admitting: Advanced Practice Midwife

## 2014-07-13 VITALS — BP 120/69 | HR 66 | Temp 97.6°F | Wt 168.2 lb

## 2014-07-13 DIAGNOSIS — E010 Iodine-deficiency related diffuse (endemic) goiter: Secondary | ICD-10-CM

## 2014-07-13 DIAGNOSIS — O0933 Supervision of pregnancy with insufficient antenatal care, third trimester: Secondary | ICD-10-CM

## 2014-07-13 DIAGNOSIS — E049 Nontoxic goiter, unspecified: Secondary | ICD-10-CM

## 2014-07-13 LAB — POCT URINALYSIS DIP (DEVICE)
BILIRUBIN URINE: NEGATIVE
Glucose, UA: NEGATIVE mg/dL
HGB URINE DIPSTICK: NEGATIVE
Ketones, ur: NEGATIVE mg/dL
LEUKOCYTES UA: NEGATIVE
NITRITE: NEGATIVE
PH: 7 (ref 5.0–8.0)
Protein, ur: NEGATIVE mg/dL
Specific Gravity, Urine: 1.015 (ref 1.005–1.030)
Urobilinogen, UA: 1 mg/dL (ref 0.0–1.0)

## 2014-07-13 NOTE — Progress Notes (Signed)
Doing well. GBS and cultures done. States applied for Medicaid 5 months ago. Advised to check on it today in person. Wants BTL.

## 2014-07-13 NOTE — Patient Instructions (Signed)
Third Trimester of Pregnancy The third trimester is from week 29 through week 42, months 7 through 9. The third trimester is a time when the fetus is growing rapidly. At the end of the ninth month, the fetus is about 20 inches in length and weighs 6-10 pounds.  BODY CHANGES Your body goes through many changes during pregnancy. The changes vary from woman to woman.   Your weight will continue to increase. You can expect to gain 25-35 pounds (11-16 kg) by the end of the pregnancy.  You may begin to get stretch marks on your hips, abdomen, and breasts.  You may urinate more often because the fetus is moving lower into your pelvis and pressing on your bladder.  You may develop or continue to have heartburn as a result of your pregnancy.  You may develop constipation because certain hormones are causing the muscles that push waste through your intestines to slow down.  You may develop hemorrhoids or swollen, bulging veins (varicose veins).  You may have pelvic pain because of the weight gain and pregnancy hormones relaxing your joints between the bones in your pelvis. Backaches may result from overexertion of the muscles supporting your posture.  You may have changes in your hair. These can include thickening of your hair, rapid growth, and changes in texture. Some women also have hair loss during or after pregnancy, or hair that feels dry or thin. Your hair will most likely return to normal after your baby is born.  Your breasts will continue to grow and be tender. A yellow discharge may leak from your breasts called colostrum.  Your belly button may stick out.  You may feel short of breath because of your expanding uterus.  You may notice the fetus "dropping," or moving lower in your abdomen.  You may have a bloody mucus discharge. This usually occurs a few days to a week before labor begins.  Your cervix becomes thin and soft (effaced) near your due date. WHAT TO EXPECT AT YOUR PRENATAL  EXAMS  You will have prenatal exams every 2 weeks until week 36. Then, you will have weekly prenatal exams. During a routine prenatal visit:  You will be weighed to make sure you and the fetus are growing normally.  Your blood pressure is taken.  Your abdomen will be measured to track your baby's growth.  The fetal heartbeat will be listened to.  Any test results from the previous visit will be discussed.  You may have a cervical check near your due date to see if you have effaced. At around 36 weeks, your caregiver will check your cervix. At the same time, your caregiver will also perform a test on the secretions of the vaginal tissue. This test is to determine if a type of bacteria, Group B streptococcus, is present. Your caregiver will explain this further. Your caregiver may ask you:  What your birth plan is.  How you are feeling.  If you are feeling the baby move.  If you have had any abnormal symptoms, such as leaking fluid, bleeding, severe headaches, or abdominal cramping.  If you have any questions. Other tests or screenings that may be performed during your third trimester include:  Blood tests that check for low iron levels (anemia).  Fetal testing to check the health, activity level, and growth of the fetus. Testing is done if you have certain medical conditions or if there are problems during the pregnancy. FALSE LABOR You may feel small, irregular contractions that   eventually go away. These are called Braxton Hicks contractions, or false labor. Contractions may last for hours, days, or even weeks before true labor sets in. If contractions come at regular intervals, intensify, or become painful, it is best to be seen by your caregiver.  SIGNS OF LABOR   Menstrual-like cramps.  Contractions that are 5 minutes apart or less.  Contractions that start on the top of the uterus and spread down to the lower abdomen and back.  A sense of increased pelvic pressure or back  pain.  A watery or bloody mucus discharge that comes from the vagina. If you have any of these signs before the 37th week of pregnancy, call your caregiver right away. You need to go to the hospital to get checked immediately. HOME CARE INSTRUCTIONS   Avoid all smoking, herbs, alcohol, and unprescribed drugs. These chemicals affect the formation and growth of the baby.  Follow your caregiver's instructions regarding medicine use. There are medicines that are either safe or unsafe to take during pregnancy.  Exercise only as directed by your caregiver. Experiencing uterine cramps is a good sign to stop exercising.  Continue to eat regular, healthy meals.  Wear a good support bra for breast tenderness.  Do not use hot tubs, steam rooms, or saunas.  Wear your seat belt at all times when driving.  Avoid raw meat, uncooked cheese, cat litter boxes, and soil used by cats. These carry germs that can cause birth defects in the baby.  Take your prenatal vitamins.  Try taking a stool softener (if your caregiver approves) if you develop constipation. Eat more high-fiber foods, such as fresh vegetables or fruit and whole grains. Drink plenty of fluids to keep your urine clear or pale yellow.  Take warm sitz baths to soothe any pain or discomfort caused by hemorrhoids. Use hemorrhoid cream if your caregiver approves.  If you develop varicose veins, wear support hose. Elevate your feet for 15 minutes, 3-4 times a day. Limit salt in your diet.  Avoid heavy lifting, wear low heal shoes, and practice good posture.  Rest a lot with your legs elevated if you have leg cramps or low back pain.  Visit your dentist if you have not gone during your pregnancy. Use a soft toothbrush to brush your teeth and be gentle when you floss.  A sexual relationship may be continued unless your caregiver directs you otherwise.  Do not travel far distances unless it is absolutely necessary and only with the approval  of your caregiver.  Take prenatal classes to understand, practice, and ask questions about the labor and delivery.  Make a trial run to the hospital.  Pack your hospital bag.  Prepare the baby's nursery.  Continue to go to all your prenatal visits as directed by your caregiver. SEEK MEDICAL CARE IF:  You are unsure if you are in labor or if your water has broken.  You have dizziness.  You have mild pelvic cramps, pelvic pressure, or nagging pain in your abdominal area.  You have persistent nausea, vomiting, or diarrhea.  You have a bad smelling vaginal discharge.  You have pain with urination. SEEK IMMEDIATE MEDICAL CARE IF:   You have a fever.  You are leaking fluid from your vagina.  You have spotting or bleeding from your vagina.  You have severe abdominal cramping or pain.  You have rapid weight loss or gain.  You have shortness of breath with chest pain.  You notice sudden or extreme swelling   of your face, hands, ankles, feet, or legs.  You have not felt your baby move in over an hour.  You have severe headaches that do not go away with medicine.  You have vision changes. Document Released: 01/23/2001 Document Revised: 02/03/2013 Document Reviewed: 04/01/2012 [employer] Patient Information 2015 [employer]. This information is not intended to replace advice given to you by your health care provider. Make sure you discuss any questions you have with your health care provider.  

## 2014-07-13 NOTE — Telephone Encounter (Signed)
Per [name],CNM patient needs referral to endocrinology for goiter-- appears patient has medicaid. Had thyroid U/S in February 2016 and radiologist thought she may need a biopsy. Told to try Dr. Dorris Fetch in [place]. [facility] at [phone] and spoke with admin staff who states they are accepting pregnancy medicaid and will fax over referral form for completion. Patient here in clinic today-- confirmed patient does not have pregnancy medicaid-- she has applied but has not been accepted. Still only has family planning and is therefore considered self-pay. Patient states she cannot afford visit out of pocket. Will not send referral at this time. Advised patient call clinic when informed of pregnancy medicaid so referral can be sent. Also advised patient complete financial assistance paperwork for [facility] so that we may try [facility]. Patient verbalized understanding.

## 2014-07-13 NOTE — Progress Notes (Signed)
C/o lower back pain and pressure.  Requests Tdap next visit.  Wants BTL but does not have pregnancy medicaid at this time. Informed patient will not be able to fill out forms until approved.  Has not yet left urine.

## 2014-07-14 LAB — GC/CHLAMYDIA PROBE AMP
CT PROBE, AMP APTIMA: NEGATIVE
GC Probe RNA: NEGATIVE

## 2014-07-15 LAB — CULTURE, BETA STREP (GROUP B ONLY)

## 2014-07-20 ENCOUNTER — Ambulatory Visit (INDEPENDENT_AMBULATORY_CARE_PROVIDER_SITE_OTHER): Payer: Self-pay | Admitting: Family

## 2014-07-20 VITALS — BP 114/71 | HR 71 | Wt 166.3 lb

## 2014-07-20 DIAGNOSIS — O0932 Supervision of pregnancy with insufficient antenatal care, second trimester: Secondary | ICD-10-CM

## 2014-07-20 LAB — POCT URINALYSIS DIP (DEVICE)
BILIRUBIN URINE: NEGATIVE
Glucose, UA: NEGATIVE mg/dL
Hgb urine dipstick: NEGATIVE
Ketones, ur: NEGATIVE mg/dL
Leukocytes, UA: NEGATIVE
Nitrite: NEGATIVE
Protein, ur: NEGATIVE mg/dL
Specific Gravity, Urine: 1.02 (ref 1.005–1.030)
Urobilinogen, UA: 0.2 mg/dL (ref 0.0–1.0)
pH: 6 (ref 5.0–8.0)

## 2014-07-20 NOTE — Progress Notes (Signed)
Subjective:   Jennifer Dunlap is a 34 y.o. F6O1308 at [redacted]w[redacted]d being seen today for her obstetrical visit.  Patient reports no complaints.  Contractions: Irritability.  Vag. Bleeding: None.  Fetal Movement: Present. Denies contractions leaking of fluid.   The following portions of the patient's history were reviewed and updated as appropriate: allergies, current medications, past family history, past medical history, past social history, past surgical history and problem list.   Objective:  BP 114/71 mmHg  Pulse 71  Wt 166 lb 4.8 oz (75.433 kg)  LMP 10/30/2013 (Approximate)  Fetal Status: Fetal Heart Rate (bpm): 128 Fundal Height: 38 cm Movement: Present  Presentation: Vertex  General:  Alert, cooperative and appears stated age. Patient is in no acute distress.  Skin: Skin is warm and dry. No rash noted. Not diaphoretic. No erythema. No pallor.  Cardiovascular: Normal heart rate noted, normal rhythm  Respiratory: Effort and breath sounds normal, no problems with respiration noted  Abdomen: Soft, gravid, appropriate for gestational age. Pain/Pressure: Absent     Vaginal: Vag. Bleeding: None.       Cervix:      Not checked  Extremities: Normal range of motion.  Edema: Trace  Mental Status: Normal mood and affect. Normal behavior. Normal judgment and thought content.   Urinalysis:     . Neg protein Neg Glucose  Assessment and Plan:   Pregnancy:  34 y.o. [id number] at [redacted]w[redacted]d  1. Late prenatal care affecting pregnancy in second trimester, antepartum Reviewed GBS results   Labor symptoms and precations: vaginal bleeding, contractions and leaking of fluid reviewed in detail.  Fetal movement precautions also reviewed. Please refer to After Visit Summary for other counseling recommendations.   Return in about 1 week (around 07/27/2014).   Venia Carbon Michiel Cowboy, CNM

## 2014-07-27 ENCOUNTER — Encounter: Payer: Self-pay | Admitting: Advanced Practice Midwife

## 2014-07-27 ENCOUNTER — Encounter (HOSPITAL_COMMUNITY): Payer: Self-pay | Admitting: *Deleted

## 2014-07-27 ENCOUNTER — Inpatient Hospital Stay (HOSPITAL_COMMUNITY)
Admission: AD | Admit: 2014-07-27 | Discharge: 2014-07-27 | Disposition: A | Discharge status: Home / Self Care | Payer: Medicaid Other | Source: Ambulatory Visit | Attending: Obstetrics and Gynecology | Admitting: Obstetrics and Gynecology

## 2014-07-27 DIAGNOSIS — O9989 Other specified diseases and conditions complicating pregnancy, childbirth and the puerperium: Secondary | ICD-10-CM | POA: Insufficient documentation

## 2014-07-27 DIAGNOSIS — Z3A38 38 weeks gestation of pregnancy: Secondary | ICD-10-CM | POA: Insufficient documentation

## 2014-07-27 LAB — OB RESULTS CONSOLE GBS: GBS: POSITIVE

## 2014-07-27 NOTE — MAU Note (Addendum)
PT  SAYS  [facility]  HURT  BAD    SINCE 930PM.  [facility]-  CLINIC  - VE 1 CM  LAST  Tuesday .  DENIES HSV AND MRSA.   GBS-  UNSURE    HAS AN APPOINTMENT   AT 0820  THIS  AM

## 2014-07-27 NOTE — MAU Note (Signed)
Contractions tonight. Leaking something but not sure if water broke, leaking since 1030 pm tonight. Denies vaginal bleeding. Positive fetal movement. Was 1 cm in office last week.

## 2014-08-03 ENCOUNTER — Ambulatory Visit (INDEPENDENT_AMBULATORY_CARE_PROVIDER_SITE_OTHER): Payer: Medicaid Other | Admitting: Obstetrics and Gynecology

## 2014-08-03 ENCOUNTER — Encounter: Payer: Self-pay | Admitting: Obstetrics and Gynecology

## 2014-08-03 VITALS — BP 111/74 | HR 69 | Temp 97.6°F | Wt 167.4 lb

## 2014-08-03 DIAGNOSIS — Z23 Encounter for immunization: Secondary | ICD-10-CM

## 2014-08-03 DIAGNOSIS — O0932 Supervision of pregnancy with insufficient antenatal care, second trimester: Secondary | ICD-10-CM

## 2014-08-03 DIAGNOSIS — Z22338 Carrier of other streptococcus: Secondary | ICD-10-CM

## 2014-08-03 LAB — POCT URINALYSIS DIP (DEVICE)
Bilirubin Urine: NEGATIVE
Glucose, UA: NEGATIVE mg/dL
Hgb urine dipstick: NEGATIVE
Ketones, ur: NEGATIVE mg/dL
Leukocytes, UA: NEGATIVE
Nitrite: NEGATIVE
Protein, ur: NEGATIVE mg/dL
Specific Gravity, Urine: 1.015 (ref 1.005–1.030)
Urobilinogen, UA: 2 mg/dL — ABNORMAL HIGH (ref 0.0–1.0)
pH: 6 (ref 5.0–8.0)

## 2014-08-03 MED ORDER — TETANUS-DIPHTH-ACELL PERTUSSIS 5-2.5-18.5 LF-MCG/0.5 IM SUSP
0.5000 mL | Freq: Once | INTRAMUSCULAR | Status: AC
  Administered 2014-08-03: 0.5 mL via INTRAMUSCULAR

## 2014-08-03 NOTE — Progress Notes (Signed)
Subjective:  Jennifer Dunlap is a 34 y.o. [id number] at [redacted]w[redacted]d being seen today for ongoing prenatal care.  Patient reports no complaints. Seen in MAU last week for false labor. Declines cx check today.  Contractions: Irritability.  Vag. Bleeding: None. Movement: Present. Denies leaking of fluid.   The following portions of the patient's history were reviewed and updated as appropriate: allergies, current medications, past family history, past medical history, past social history, past surgical history and problem list.   Objective:   Filed Vitals:   08/03/14 1009  BP: 111/74  Pulse: 69  Temp: 97.6 F (36.4 C)  Weight: 167 lb 6.4 oz (75.932 kg)    Fetal Status: Fetal Heart Rate (bpm): 134   Movement: Present     General:  Alert, oriented and cooperative. Patient is in no acute distress.  Skin: Skin is warm and dry. No rash noted.   Cardiovascular: Normal heart rate noted  Respiratory: Effort and breath sounds normal, no problems with respiration noted  Abdomen: Soft, gravid, appropriate for gestational age. Pain/Pressure: Absent     Vaginal: Vag. Bleeding: None.       Cervix: Not evaluated       Extremities: Normal range of motion.  Edema: Trace  Mental Status: Normal mood and affect. Normal behavior. Normal judgment and thought content.   Urinalysis:      Assessment and Plan:  Pregnancy: [id number] at [redacted]w[redacted]d  1. Need for Tdap vaccination  - Tdap (BOOSTRIX) injection 0.5 mL; Inject 0.5 mLs into the muscle once.  2. Late prenatal care affecting pregnancy in second trimester, antepartum   3. 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 Discussed LARC> wants Nuvaring instead Please refer to After Visit Summary for other counseling recommendations.   Return in about 1 week (around  08/10/2014).   Lorene Dy, CNM

## 2014-08-03 NOTE — Patient Instructions (Signed)

## 2014-08-03 NOTE — Progress Notes (Signed)
Discussed breastfeeding tip of the week.   tdap today.

## 2014-08-10 ENCOUNTER — Other Ambulatory Visit: Payer: Medicaid Other

## 2014-08-10 ENCOUNTER — Telehealth (HOSPITAL_COMMUNITY): Payer: Self-pay | Admitting: *Deleted

## 2014-08-10 ENCOUNTER — Ambulatory Visit (INDEPENDENT_AMBULATORY_CARE_PROVIDER_SITE_OTHER): Payer: Medicaid Other | Admitting: Advanced Practice Midwife

## 2014-08-10 VITALS — BP 130/77 | HR 65 | Temp 98.0°F | Wt 165.8 lb

## 2014-08-10 DIAGNOSIS — O480 Post-term pregnancy: Secondary | ICD-10-CM | POA: Diagnosis not present

## 2014-08-10 DIAGNOSIS — O0932 Supervision of pregnancy with insufficient antenatal care, second trimester: Secondary | ICD-10-CM | POA: Diagnosis not present

## 2014-08-10 DIAGNOSIS — E049 Nontoxic goiter, unspecified: Secondary | ICD-10-CM | POA: Diagnosis not present

## 2014-08-10 DIAGNOSIS — E010 Iodine-deficiency related diffuse (endemic) goiter: Secondary | ICD-10-CM

## 2014-08-10 LAB — POCT URINALYSIS DIP (DEVICE)
BILIRUBIN URINE: NEGATIVE
GLUCOSE, UA: NEGATIVE mg/dL
HGB URINE DIPSTICK: NEGATIVE
Ketones, ur: NEGATIVE mg/dL
Leukocytes, UA: NEGATIVE
NITRITE: NEGATIVE
Protein, ur: NEGATIVE mg/dL
Specific Gravity, Urine: 1.015 (ref 1.005–1.030)
Urobilinogen, UA: 1 mg/dL (ref 0.0–1.0)
pH: 7 (ref 5.0–8.0)

## 2014-08-10 NOTE — Telephone Encounter (Signed)
Preadmission screen  

## 2014-08-10 NOTE — Patient Instructions (Signed)
Braxton Hicks Contractions °Contractions of the uterus can occur throughout pregnancy. Contractions are not always a sign that you are in labor.  °WHAT ARE BRAXTON HICKS CONTRACTIONS?  °Contractions that occur before labor are called Braxton Hicks contractions, or false labor. Toward the end of pregnancy (32-34 weeks), these contractions can develop more often and may become more forceful. This is not true labor because these contractions do not result in opening (dilatation) and thinning of the cervix. They are sometimes difficult to tell apart from true labor because these contractions can be forceful and people have different pain tolerances. You should not feel embarrassed if you go to the hospital with false labor. Sometimes, the only way to tell if you are in true labor is for your health care provider to look for changes in the cervix. °If there are no prenatal problems or other health problems associated with the pregnancy, it is completely safe to be sent home with false labor and await the onset of true labor. °HOW CAN YOU TELL THE DIFFERENCE BETWEEN TRUE AND FALSE LABOR? °False Labor °· The contractions of false labor are usually shorter and not as hard as those of true labor.   °· The contractions are usually irregular.   °· The contractions are often felt in the front of the lower abdomen and in the groin.   °· The contractions may go away when you walk around or change positions while lying down.   °· The contractions get weaker and are shorter lasting as time goes on.   °· The contractions do not usually become progressively stronger, regular, and closer together as with true labor.   °True Labor °1. Contractions in true labor last 30-70 seconds, become very regular, usually become more intense, and increase in frequency.   °2. The contractions do not go away with walking.   °3. The discomfort is usually felt in the top of the uterus and spreads to the lower abdomen and low back.   °4. True labor can  be determined by your health care provider with an exam. This will show that the cervix is dilating and getting thinner.   °WHAT TO REMEMBER °· Keep up with your usual exercises and follow other instructions given by your health care provider.   °· Take medicines as directed by your health care provider.   °· Keep your regular prenatal appointments.   °· Eat and drink lightly if you think you are going into labor.   °· If Braxton Hicks contractions are making you uncomfortable:   °· Change your position from lying down or resting to walking, or from walking to resting.   °· Sit and rest in a tub of warm water.   °· Drink 2-3 glasses of water. Dehydration may cause these contractions.   °· Do slow and deep breathing several times an hour.   °WHEN SHOULD I SEEK IMMEDIATE MEDICAL CARE? °Seek immediate medical care if: °· Your contractions become stronger, more regular, and closer together.   °· You have fluid leaking or gushing from your vagina.   °· You have a fever.   °· You pass blood-tinged mucus.   °· You have vaginal bleeding.   °· You have continuous abdominal pain.   °· You have low back pain that you never had before.   °· You feel your baby's head pushing down and causing pelvic pressure.   °· Your baby is not moving as much as it used to.   °Document Released: 01/29/2005 Document Revised: 02/03/2013 Document Reviewed: 11/10/2012 °[employer]® Patient Information ©2015 [employer]. This information is not intended to replace advice given to you by your health care   provider. Make sure you discuss any questions you have with your health care provider. ° °Fetal Movement Counts °Patient Name: __________________________________________________ Patient Due Date: ____________________ °Performing a fetal movement count is highly recommended in high-risk pregnancies, but it is good for every pregnant woman to do. Your health care provider may ask you to start counting fetal movements at 28 weeks of the pregnancy. Fetal  movements often increase: °· After eating a full meal. °· After physical activity. °· After eating or drinking something sweet or cold. °· At rest. °Pay attention to when you feel the baby is most active. This will help you notice a pattern of your baby's sleep and wake cycles and what factors contribute to an increase in fetal movement. It is important to perform a fetal movement count at the same time each day when your baby is normally most active.  °HOW TO COUNT FETAL MOVEMENTS °5. Find a quiet and comfortable area to sit or lie down on your left side. Lying on your left side provides the best blood and oxygen circulation to your baby. °6. Write down the day and time on a sheet of paper or in a journal. °7. Start counting kicks, flutters, swishes, rolls, or jabs in a 2-hour period. You should feel at least 10 movements within 2 hours. °8. If you do not feel 10 movements in 2 hours, wait 2-3 hours and count again. Look for a change in the pattern or not enough counts in 2 hours. °SEEK MEDICAL CARE IF: °· You feel less than 10 counts in 2 hours, tried twice. °· There is no movement in over an hour. °· The pattern is changing or taking longer each day to reach 10 counts in 2 hours. °· You feel the baby is not moving as he or she usually does. °Date: ____________ Movements: ____________ Start time: ____________ Finish time: ____________  °Date: ____________ Movements: ____________ Start time: ____________ Finish time: ____________ °Date: ____________ Movements: ____________ Start time: ____________ Finish time: ____________ °Date: ____________ Movements: ____________ Start time: ____________ Finish time: ____________ °Date: ____________ Movements: ____________ Start time: ____________ Finish time: ____________ °Date: ____________ Movements: ____________ Start time: ____________ Finish time: ____________ °Date: ____________ Movements: ____________ Start time: ____________ Finish time: ____________ °Date: ____________  Movements: ____________ Start time: ____________ Finish time: ____________  °Date: ____________ Movements: ____________ Start time: ____________ Finish time: ____________ °Date: ____________ Movements: ____________ Start time: ____________ Finish time: ____________ °Date: ____________ Movements: ____________ Start time: ____________ Finish time: ____________ °Date: ____________ Movements: ____________ Start time: ____________ Finish time: ____________ °Date: ____________ Movements: ____________ Start time: ____________ Finish time: ____________ °Date: ____________ Movements: ____________ Start time: ____________ Finish time: ____________ °Date: ____________ Movements: ____________ Start time: ____________ Finish time: ____________  °Date: ____________ Movements: ____________ Start time: ____________ Finish time: ____________ °Date: ____________ Movements: ____________ Start time: ____________ Finish time: ____________ °Date: ____________ Movements: ____________ Start time: ____________ Finish time: ____________ °Date: ____________ Movements: ____________ Start time: ____________ Finish time: ____________ °Date: ____________ Movements: ____________ Start time: ____________ Finish time: ____________ °Date: ____________ Movements: ____________ Start time: ____________ Finish time: ____________ °Date: ____________ Movements: ____________ Start time: ____________ Finish time: ____________  °Date: ____________ Movements: ____________ Start time: ____________ Finish time: ____________ °Date: ____________ Movements: ____________ Start time: ____________ Finish time: ____________ °Date: ____________ Movements: ____________ Start time: ____________ Finish time: ____________ °Date: ____________ Movements: ____________ Start time: ____________ Finish time: ____________ °Date: ____________ Movements: ____________ Start time: ____________ Finish time: ____________ °Date: ____________ Movements: ____________ Start time:  ____________ Finish time: ____________ °Date: ____________ Movements:   ____________ Start time: ____________ Finish time: ____________  °Date: ____________ Movements: ____________ Start time: ____________ Finish time: ____________ °Date: ____________ Movements: ____________ Start time: ____________ Finish time: ____________ °Date: ____________ Movements: ____________ Start time: ____________ Finish time: ____________ °Date: ____________ Movements: ____________ Start time: ____________ Finish time: ____________ °Date: ____________ Movements: ____________ Start time: ____________ Finish time: ____________ °Date: ____________ Movements: ____________ Start time: ____________ Finish time: ____________ °Date: ____________ Movements: ____________ Start time: ____________ Finish time: ____________  °Date: ____________ Movements: ____________ Start time: ____________ Finish time: ____________ °Date: ____________ Movements: ____________ Start time: ____________ Finish time: ____________ °Date: ____________ Movements: ____________ Start time: ____________ Finish time: ____________ °Date: ____________ Movements: ____________ Start time: ____________ Finish time: ____________ °Date: ____________ Movements: ____________ Start time: ____________ Finish time: ____________ °Date: ____________ Movements: ____________ Start time: ____________ Finish time: ____________ °Date: ____________ Movements: ____________ Start time: ____________ Finish time: ____________  °Date: ____________ Movements: ____________ Start time: ____________ Finish time: ____________ °Date: ____________ Movements: ____________ Start time: ____________ Finish time: ____________ °Date: ____________ Movements: ____________ Start time: ____________ Finish time: ____________ °Date: ____________ Movements: ____________ Start time: ____________ Finish time: ____________ °Date: ____________ Movements: ____________ Start time: ____________ Finish time: ____________ °Date:  ____________ Movements: ____________ Start time: ____________ Finish time: ____________ °Date: ____________ Movements: ____________ Start time: ____________ Finish time: ____________  °Date: ____________ Movements: ____________ Start time: ____________ Finish time: ____________ °Date: ____________ Movements: ____________ Start time: ____________ Finish time: ____________ °Date: ____________ Movements: ____________ Start time: ____________ Finish time: ____________ °Date: ____________ Movements: ____________ Start time: ____________ Finish time: ____________ °Date: ____________ Movements: ____________ Start time: ____________ Finish time: ____________ °Date: ____________ Movements: ____________ Start time: ____________ Finish time: ____________ °Document Released: 02/28/2006 Document Revised: 06/15/2013 Document Reviewed: 11/26/2011 °[employer]® Patient Information ©2015 [employer]. This information is not intended to replace advice given to you by your health care provider. Make sure you discuss any questions you have with your health care provider. ° °

## 2014-08-10 NOTE — Progress Notes (Signed)
IOL scheduled 7/1 @ 0730.  Labor sx reviewed

## 2014-08-10 NOTE — Progress Notes (Signed)
   Subjective:  Jennifer Dunlap is a 34 y.o. [id number] at [redacted]w[redacted]d being seen today for ongoing prenatal care.  Patient reports possible LOF x several days. Underwear feel damp.  Contractions: Irregular.  Vag. Bleeding: None. Movement: Present.    The following portions of the patient's history were reviewed and updated as appropriate: allergies, current medications, past family history, past medical history, past social history, past surgical history and problem list.   Objective:   Filed Vitals:   08/10/14 1038  BP: 130/77  Pulse: 65  Temp: 98 F (36.7 C)  Weight: 165 lb 12.8 oz (75.206 kg)    Fetal Status: Fetal Heart Rate (bpm): 120 Fundal Height: 40 cm Movement: Present  Presentation: Vertex NST reactive. Normal AFI.    General:  Alert, oriented and cooperative. Patient is in no acute distress.  Skin: Skin is warm and dry. No rash noted.   Cardiovascular: Normal heart rate noted  Respiratory: Normal respiratory effort, no problems with respiration noted  Abdomen: Soft, gravid, appropriate for gestational age. Pain/Pressure: Present     Vaginal: Vag. Bleeding: None.    Vag D/C Character: Watery  Cervix: Exam revealed Dilation: Closed Effacement (%): 0 Station: -3. Neg pool. Neg fern. Scant physiologic discharge.  Extremities: Normal range of motion.  Edema: Trace  Mental Status: Normal mood and affect. Normal behavior. Normal judgment and thought content.   Urinalysis: Urine Protein: Negative Urine Glucose: Negative  Assessment and Plan:  Pregnancy: [id number] at [redacted]w[redacted]d  1. Post term pregnancy, antepartum condition or complication  - Amniotic fluid index with NST  2. Thyromegaly   3. Late prenatal care affecting pregnancy in second trimester, antepartum    Term labor symptoms and general obstetric precautions including but not limited to vaginal bleeding, contractions, leaking of fluid and fetal movement were reviewed in detail with the patient.  Please refer to After Visit  Summary for other counseling recommendations.   IOL 08/13/14.  Return in about 6 weeks (around 09/21/2014) for NST at 1:00 today adn postpartum visit in 6 weeks.   Manya Silvas, CNM

## 2014-08-12 ENCOUNTER — Encounter: Payer: Self-pay | Admitting: Advanced Practice Midwife

## 2014-08-12 NOTE — H&P (Unsigned)
Jennifer Dunlap is a 34 y.o. female presenting for IOL for postdates. Maternal Medical History:  Contractions: Frequency: rare.    Fetal activity: Perceived fetal activity is normal.   Last perceived fetal movement was within the past hour.    Prenatal complications: no prenatal complications  except late to care  OB History    Gravida Para Term Preterm AB TAB SAB Ectopic Multiple Living   9 6 6  0 2 2 0 0 0 6     Past Medical History  Diagnosis Date  . Pericarditis 2011  . Benign tumor of thyroid gland    Past Surgical History  Procedure Laterality Date  . No past surgeries     Family History: family history includes Brain cancer in her maternal grandmother; Cancer (age of onset: 59) in her sister; Heart attack in her father and paternal grandmother; Heart disease in her father; Hypertension in her mother; Stroke in her father. Social History:  reports that she has never smoked. She has never used smokeless tobacco. She reports that she does not drink alcohol or use illicit drugs.   Prenatal Transfer Tool  Maternal Diabetes: No Genetic Screening: Declined Maternal Ultrasounds/Referrals: Abnormal:  Findings:   Other: insignificant tiny bilateral CP cysts Fetal Ultrasounds or other Referrals:  None, Referred to Materal Fetal Medicine  Maternal Substance Abuse:  No Significant Maternal Medications:  None Significant Maternal Lab Results:  Lab values include: Group B Strep positive Other Comments:  None  Review of Systems  Constitutional: Negative.   Eyes: Negative.   Respiratory: Negative.   Cardiovascular: Negative.   Gastrointestinal: Negative.   Genitourinary: Negative.   Musculoskeletal: Negative.   Skin: Negative.       Last menstrual period 10/30/2013. Maternal Exam:  Uterine Assessment: No contractions  Abdomen: Fetal presentation: vertex  Pelvis: adequate for delivery.   Cervix: ***  Fetal Exam Fetal Monitor Review: Baseline rate: 140.  Variability:  moderate (6-25 bpm).   Pattern: accelerations present.    Fetal State Assessment: Category I - tracings are normal.     Cervx 2-3/thick/mod consistency/-3/vertex  Physical Exam  Constitutional: She is oriented to person, place, and time. She appears well-developed and well-nourished.  HENT:  Head: Normocephalic.  Cardiovascular: Normal rate and regular rhythm.   Respiratory: Effort normal.  GI: Soft. There is no tenderness.  Genitourinary: Vagina normal and uterus normal.  Musculoskeletal: Normal range of motion.  Neurological: She is alert and oriented to person, place, and time. She has normal reflexes.  Skin: Skin is warm and dry.  Psychiatric: She has a normal mood and affect.    Prenatal labs: ABO, Rh: O/POS/-- (02/19 0957) Antibody: NEG (02/19 0957) Rubella: 4.33 (02/19 0957) RPR: NON REAC (04/15 1202)  HBsAg: NEGATIVE (02/19 0957)  HIV: NONREACTIVE (04/15 1202)  GBS: Positive (06/14 0000)   Assessment/Plan: IOL for postdates:  Plan cytotec to ripen cervix then pitocin PCN for GBS prophylaxis   CRESENZO-DISHMAN,Isidora Laham 08/12/2014, 9:02 PM

## 2014-08-13 ENCOUNTER — Inpatient Hospital Stay (HOSPITAL_COMMUNITY): Payer: Medicaid Other | Admitting: Anesthesiology

## 2014-08-13 ENCOUNTER — Inpatient Hospital Stay (HOSPITAL_COMMUNITY)
Admission: RE | Admit: 2014-08-13 | Discharge: 2014-08-15 | DRG: 775 | Disposition: A | Discharge status: Home / Self Care | Payer: Medicaid Other | Source: Ambulatory Visit | Attending: Family Medicine | Admitting: Family Medicine

## 2014-08-13 ENCOUNTER — Encounter (HOSPITAL_COMMUNITY): Payer: Self-pay

## 2014-08-13 VITALS — BP 112/63 | HR 60 | Temp 98.0°F | Resp 16 | Ht 63.75 in | Wt 165.0 lb

## 2014-08-13 DIAGNOSIS — Z3A41 41 weeks gestation of pregnancy: Secondary | ICD-10-CM | POA: Diagnosis not present

## 2014-08-13 DIAGNOSIS — O99824 Streptococcus B carrier state complicating childbirth: Secondary | ICD-10-CM

## 2014-08-13 DIAGNOSIS — O480 Post-term pregnancy: Secondary | ICD-10-CM | POA: Diagnosis present

## 2014-08-13 DIAGNOSIS — O350XX1 Maternal care for (suspected) central nervous system malformation in fetus, fetus 1: Secondary | ICD-10-CM

## 2014-08-13 DIAGNOSIS — Z3483 Encounter for supervision of other normal pregnancy, third trimester: Secondary | ICD-10-CM

## 2014-08-13 DIAGNOSIS — O0932 Supervision of pregnancy with insufficient antenatal care, second trimester: Secondary | ICD-10-CM

## 2014-08-13 DIAGNOSIS — O3503X1 Maternal care for (suspected) central nervous system malformation or damage in fetus, choroid plexus cysts, fetus 1: Secondary | ICD-10-CM

## 2014-08-13 LAB — CBC
HCT: 31.6 % — ABNORMAL LOW (ref 36.0–46.0)
HEMOGLOBIN: 10.6 g/dL — AB (ref 12.0–15.0)
MCH: 27.7 pg (ref 26.0–34.0)
MCHC: 33.5 g/dL (ref 30.0–36.0)
MCV: 82.5 fL (ref 78.0–100.0)
Platelets: 206 10*3/uL (ref 150–400)
RBC: 3.83 MIL/uL — ABNORMAL LOW (ref 3.87–5.11)
RDW: 14.1 % (ref 11.5–15.5)
WBC: 6.4 10*3/uL (ref 4.0–10.5)

## 2014-08-13 MED ORDER — DIBUCAINE 1 % RE OINT: 1.0000 "application " | TOPICAL_OINTMENT | RECTAL | Status: DC | PRN

## 2014-08-13 MED ORDER — OXYCODONE-ACETAMINOPHEN 5-325 MG PO TABS
2.0000 | ORAL_TABLET | ORAL | Status: DC | PRN
  Administered 2014-08-13 – 2014-08-15 (×4): 2 via ORAL
  Filled 2014-08-13 (×4): qty 2

## 2014-08-13 MED ORDER — CITRIC ACID-SODIUM CITRATE 334-500 MG/5ML PO SOLN
30.0000 mL | ORAL | Status: DC | PRN
  Filled 2014-08-13: qty 15

## 2014-08-13 MED ORDER — TERBUTALINE SULFATE 1 MG/ML IJ SOLN
0.2500 mg | Freq: Once | INTRAMUSCULAR | Status: AC
  Administered 2014-08-13: 0.25 mg via SUBCUTANEOUS

## 2014-08-13 MED ORDER — LIDOCAINE HCL (PF) 1 % IJ SOLN
30.0000 mL | INTRAMUSCULAR | Status: DC | PRN
  Filled 2014-08-13: qty 30

## 2014-08-13 MED ORDER — LACTATED RINGERS IV SOLN: 500.0000 mL | INTRAVENOUS | Status: DC | PRN

## 2014-08-13 MED ORDER — ACETAMINOPHEN 325 MG PO TABS: 650.0000 mg | ORAL_TABLET | ORAL | Status: DC | PRN

## 2014-08-13 MED ORDER — EPHEDRINE 5 MG/ML INJ
10.0000 mg | INTRAVENOUS | Status: DC | PRN
  Filled 2014-08-13: qty 2

## 2014-08-13 MED ORDER — OXYCODONE-ACETAMINOPHEN 5-325 MG PO TABS
1.0000 | ORAL_TABLET | ORAL | Status: DC | PRN
  Administered 2014-08-13 – 2014-08-15 (×5): 1 via ORAL
  Filled 2014-08-13 (×5): qty 1

## 2014-08-13 MED ORDER — PRENATAL MULTIVITAMIN CH
1.0000 | ORAL_TABLET | Freq: Every day | ORAL | Status: DC
  Administered 2014-08-14 – 2014-08-15 (×2): 1 via ORAL
  Filled 2014-08-13 (×2): qty 1

## 2014-08-13 MED ORDER — TERBUTALINE SULFATE 1 MG/ML IJ SOLN
0.2500 mg | Freq: Once | INTRAMUSCULAR | Status: AC | PRN
  Administered 2014-08-13: 0.25 mg via SUBCUTANEOUS
  Filled 2014-08-13 (×2): qty 1

## 2014-08-13 MED ORDER — ONDANSETRON HCL 4 MG/2ML IJ SOLN: 4.0000 mg | INTRAMUSCULAR | Status: DC | PRN

## 2014-08-13 MED ORDER — SENNOSIDES-DOCUSATE SODIUM 8.6-50 MG PO TABS
2.0000 | ORAL_TABLET | ORAL | Status: DC
  Administered 2014-08-13 – 2014-08-14 (×2): 2 via ORAL
  Filled 2014-08-13 (×2): qty 2

## 2014-08-13 MED ORDER — ONDANSETRON HCL 4 MG/2ML IJ SOLN: 4.0000 mg | Freq: Four times a day (QID) | INTRAMUSCULAR | Status: DC | PRN

## 2014-08-13 MED ORDER — FENTANYL 2.5 MCG/ML BUPIVACAINE 1/10 % EPIDURAL INFUSION (WH - ANES)
14.0000 mL/h | INTRAMUSCULAR | Status: DC | PRN
  Administered 2014-08-13 (×2): 14 mL/h via EPIDURAL
  Filled 2014-08-13: qty 125

## 2014-08-13 MED ORDER — TETANUS-DIPHTH-ACELL PERTUSSIS 5-2.5-18.5 LF-MCG/0.5 IM SUSP: 0.5000 mL | Freq: Once | INTRAMUSCULAR | Status: DC

## 2014-08-13 MED ORDER — FLEET ENEMA 7-19 GM/118ML RE ENEM: 1.0000 | ENEMA | RECTAL | Status: DC | PRN

## 2014-08-13 MED ORDER — ZOLPIDEM TARTRATE 5 MG PO TABS: 5.0000 mg | ORAL_TABLET | Freq: Every evening | ORAL | Status: DC | PRN

## 2014-08-13 MED ORDER — LACTATED RINGERS IV SOLN
INTRAVENOUS | Status: DC
  Administered 2014-08-13: 09:00:00 via INTRAVENOUS

## 2014-08-13 MED ORDER — OXYTOCIN 40 UNITS IN LACTATED RINGERS INFUSION - SIMPLE MED
62.5000 mL/h | INTRAVENOUS | Status: DC
  Filled 2014-08-13: qty 1000

## 2014-08-13 MED ORDER — MISOPROSTOL 25 MCG QUARTER TABLET
25.0000 ug | ORAL_TABLET | ORAL | Status: DC | PRN
  Administered 2014-08-13: 25 ug via VAGINAL
  Filled 2014-08-13: qty 0.25
  Filled 2014-08-13: qty 1

## 2014-08-13 MED ORDER — MISOPROSTOL 200 MCG PO TABS
ORAL_TABLET | ORAL | Status: AC
  Filled 2014-08-13: qty 5

## 2014-08-13 MED ORDER — LANOLIN HYDROUS EX OINT: TOPICAL_OINTMENT | CUTANEOUS | Status: DC | PRN

## 2014-08-13 MED ORDER — SIMETHICONE 80 MG PO CHEW: 80.0000 mg | CHEWABLE_TABLET | ORAL | Status: DC | PRN

## 2014-08-13 MED ORDER — ONDANSETRON HCL 4 MG PO TABS: 4.0000 mg | ORAL_TABLET | ORAL | Status: DC | PRN

## 2014-08-13 MED ORDER — WITCH HAZEL-GLYCERIN EX PADS: 1.0000 "application " | MEDICATED_PAD | CUTANEOUS | Status: DC | PRN

## 2014-08-13 MED ORDER — DIPHENHYDRAMINE HCL 50 MG/ML IJ SOLN: 12.5000 mg | INTRAMUSCULAR | Status: DC | PRN

## 2014-08-13 MED ORDER — PHENYLEPHRINE 40 MCG/ML (10ML) SYRINGE FOR IV PUSH (FOR BLOOD PRESSURE SUPPORT)
80.0000 ug | PREFILLED_SYRINGE | INTRAVENOUS | Status: DC | PRN
  Filled 2014-08-13: qty 20
  Filled 2014-08-13: qty 2

## 2014-08-13 MED ORDER — OXYTOCIN BOLUS FROM INFUSION
500.0000 mL | INTRAVENOUS | Status: DC
  Administered 2014-08-13: 500 mL via INTRAVENOUS

## 2014-08-13 MED ORDER — IBUPROFEN 600 MG PO TABS
600.0000 mg | ORAL_TABLET | Freq: Four times a day (QID) | ORAL | Status: DC
  Administered 2014-08-13 – 2014-08-15 (×8): 600 mg via ORAL
  Filled 2014-08-13 (×8): qty 1

## 2014-08-13 MED ORDER — OXYCODONE-ACETAMINOPHEN 5-325 MG PO TABS: 1.0000 | ORAL_TABLET | ORAL | Status: DC | PRN

## 2014-08-13 MED ORDER — BENZOCAINE-MENTHOL 20-0.5 % EX AERO
1.0000 "application " | INHALATION_SPRAY | CUTANEOUS | Status: DC | PRN
  Administered 2014-08-13: 1 via TOPICAL
  Filled 2014-08-13: qty 56

## 2014-08-13 MED ORDER — LIDOCAINE HCL (PF) 1 % IJ SOLN
INTRAMUSCULAR | Status: DC | PRN
  Administered 2014-08-13 (×2): 4 mL

## 2014-08-13 MED ORDER — DIPHENHYDRAMINE HCL 25 MG PO CAPS: 25.0000 mg | ORAL_CAPSULE | Freq: Four times a day (QID) | ORAL | Status: DC | PRN

## 2014-08-13 MED ORDER — VANCOMYCIN HCL IN DEXTROSE 1-5 GM/200ML-% IV SOLN
1000.0000 mg | Freq: Two times a day (BID) | INTRAVENOUS | Status: DC
  Administered 2014-08-13: 1000 mg via INTRAVENOUS
  Filled 2014-08-13 (×2): qty 200

## 2014-08-13 MED ORDER — OXYCODONE-ACETAMINOPHEN 5-325 MG PO TABS: 2.0000 | ORAL_TABLET | ORAL | Status: DC | PRN

## 2014-08-13 NOTE — Progress Notes (Addendum)
Labor Progress Note  S: requested epidural, called to room to evaluate tachysystole s/p cytotec at 0957  O:  BP 112/82 mmHg  Pulse 86  Temp(Src) 97.9 F (36.6 C) (Axillary)  Resp 18  Ht 5' 3.75" (1.619 m)  Wt 165 lb (74.844 kg)  BMI 28.55 kg/m2  LMP 10/30/2013 (Approximate) Cat II with ongoing resuscitative efforts including bolus/position changes, terbutaline  Prolonged deceleration down to 80s x8 minutes with intermittent lows down to 60s CVE:  1000: 3/80/-2 => AROM and FSE placed 4-5/80/-2  A&P: 34 y.o. [id number] [redacted]w[redacted]d IOL 2/2 prolonged pregnancy with fetal distress # s/p terb, AROM with FSE = > after terb/fluid/O2/position changes baseline returned to normal, fetal distress resolved.  Will continue to monitor.  Dr. Nehemiah Settle present for resuscitative efforts  Merla Riches, MD 10:08 AM

## 2014-08-13 NOTE — Anesthesia Preprocedure Evaluation (Signed)
Anesthesia Evaluation  Patient identified by MRN, date of birth, ID band Patient awake    Reviewed: Allergy & Precautions, NPO status , Patient's Chart, lab work & pertinent test results  History of Anesthesia Complications Negative for: history of anesthetic complications  Airway Mallampati: II  TM Distance: >3 FB Neck ROM: Full    Dental no notable dental hx. (+) Dental Advisory Given   Pulmonary neg pulmonary ROS,  breath sounds clear to auscultation  Pulmonary exam normal       Cardiovascular negative cardio ROS Normal cardiovascular examRhythm:Regular Rate:Normal  Hx of pericarditis 5 yrs ago after birth of daughter. No problems since. No physical limitations. No symptoms   Neuro/Psych negative neurological ROS  negative psych ROS   GI/Hepatic negative GI ROS, Neg liver ROS,   Endo/Other  negative endocrine ROS  Renal/GU negative Renal ROS  negative genitourinary   Musculoskeletal negative musculoskeletal ROS (+)   Abdominal   Peds negative pediatric ROS (+)  Hematology negative hematology ROS (+)   Anesthesia Other Findings   Reproductive/Obstetrics (+) Pregnancy                             Anesthesia Physical Anesthesia Plan  ASA: II  Anesthesia Plan: Epidural   Post-op Pain Management:    Induction:   Airway Management Planned:   Additional Equipment:   Intra-op Plan:   Post-operative Plan:   Informed Consent: I have reviewed the patients History and Physical, chart, labs and discussed the procedure including the risks, benefits and alternatives for the proposed anesthesia with the patient or authorized representative who has indicated his/her understanding and acceptance.   Dental advisory given  Plan Discussed with: CRNA  Anesthesia Plan Comments:         Anesthesia Quick Evaluation

## 2014-08-13 NOTE — Anesthesia Procedure Notes (Signed)
Epidural Patient location during procedure: OB  Staffing Anesthesiologist: Yamaira Spinner Performed by: anesthesiologist   Preanesthetic Checklist Completed: patient identified, site marked, surgical consent, pre-op evaluation, timeout performed, IV checked, risks and benefits discussed and monitors and equipment checked  Epidural Patient position: sitting Prep: site prepped and draped and DuraPrep Patient monitoring: continuous pulse ox and blood pressure Approach: midline Location: L3-L4 Injection technique: LOR saline  Needle:  Needle type: Tuohy  Needle gauge: 17 G Needle length: 9 cm and 9 Needle insertion depth: 5 cm cm Catheter type: closed end flexible Catheter size: 19 Gauge Catheter at skin depth: 10 cm Test dose: negative  Assessment Events: blood not aspirated, injection not painful, no injection resistance, negative IV test and no paresthesia  Additional Notes Patient identified. Risks/Benefits/Options discussed with patient including but not limited to bleeding, infection, nerve damage, paralysis, failed block, incomplete pain control, headache, blood pressure changes, nausea, vomiting, reactions to medication both or allergic, itching and postpartum back pain. Confirmed with bedside nurse the patient's most recent platelet count. Confirmed with patient that they are not currently taking any anticoagulation, have any bleeding history or any family history of bleeding disorders. Patient expressed understanding and wished to proceed. All questions were answered. Sterile technique was used throughout the entire procedure. Please see nursing notes for vital signs. Test dose was given through epidural catheter and negative prior to continuing to dose epidural or start infusion. Warning signs of high block given to the patient including shortness of breath, tingling/numbness in hands, complete motor block, or any concerning symptoms with instructions to call for help. Patient was  given instructions on fall risk and not to get out of bed. All questions and concerns addressed with instructions to call with any issues or inadequate analgesia.      

## 2014-08-14 ENCOUNTER — Encounter (HOSPITAL_COMMUNITY): Payer: Self-pay

## 2014-08-14 LAB — CBC
HCT: 28.7 % — ABNORMAL LOW (ref 36.0–46.0)
Hemoglobin: 9.4 g/dL — ABNORMAL LOW (ref 12.0–15.0)
MCH: 27.2 pg (ref 26.0–34.0)
MCHC: 32.8 g/dL (ref 30.0–36.0)
MCV: 83.2 fL (ref 78.0–100.0)
Platelets: 159 10*3/uL (ref 150–400)
RBC: 3.45 MIL/uL — ABNORMAL LOW (ref 3.87–5.11)
RDW: 14.2 % (ref 11.5–15.5)
WBC: 8.2 10*3/uL (ref 4.0–10.5)

## 2014-08-14 LAB — RPR: RPR Ser Ql: NONREACTIVE

## 2014-08-14 NOTE — Anesthesia Postprocedure Evaluation (Signed)
Anesthesia Post Note  Patient: Jennifer Dunlap  Procedure(s) Performed: * No procedures listed *  Anesthesia type: Epidural  Patient location: Mother/Baby  Post pain: Pain level controlled  Post assessment: Post-op Vital signs reviewed  Last Vitals:  Filed Vitals:   08/14/14 0915  BP: 111/74  Pulse: 58  Temp: 36.4 C  Resp: 18    Post vital signs: Reviewed  Level of consciousness:alert  Complications: No apparent anesthesia complications

## 2014-08-14 NOTE — Lactation Note (Signed)
This note was copied from the chart of Jennifer Addalee Kavanagh. Lactation Consultation Note; Experienced BF mom reports baby is latching well. She is pumping some after nursing and feeding EBM to baby. BF brochure given with resources for support after DC. No questions at present. To call prn for assist  Patient Name: Jennifer Dunlap [id number] Date: 08/14/2014 Reason for consult: Initial assessment   Maternal Data Formula Feeding for Exclusion: No Does the patient have breastfeeding experience prior to this delivery?: Yes  Feeding    LATCH Score/Interventions                      Lactation Tools Discussed/Used     Consult Status Consult Status: PRN    Truddie Crumble 08/14/2014, 1:30 PM

## 2014-08-14 NOTE — Progress Notes (Signed)
Post Partum Day 1 Subjective: no complaints, up ad lib and voiding  Objective: Blood pressure 107/76, pulse 80, temperature 98 F (36.7 C), temperature source Oral, resp. rate 19, height 5' 3.75" (1.619 m), weight 165 lb (74.844 kg), last menstrual period 10/30/2013, SpO2 99 %, unknown if currently breastfeeding.  Physical Exam:  General: alert, cooperative, appears stated age and no distress Lochia: appropriate Uterine Fundus: firm Incision: n/a - vaginal delivery DVT Evaluation: No evidence of DVT seen on physical exam. Negative Homan's sign. No cords or calf tenderness. No significant calf/ankle edema.   Recent Labs  08/13/14 0820 08/14/14 0545  HGB 10.6* 9.4*  HCT 31.6* 28.7*    Assessment/Plan: Plan for discharge tomorrow, Breastfeeding, Lactation consult and Contraception wants BTL - will sign papers today and get post-partum   LOS: 1 day   Virginia Crews, MD, MPH PGY-2,  [facility] 08/14/2014 7:40 AM

## 2014-08-15 MED ORDER — IBUPROFEN 600 MG PO TABS: 600.0000 mg | ORAL_TABLET | Freq: Four times a day (QID) | ORAL | Status: DC

## 2014-08-15 MED ORDER — OXYCODONE-ACETAMINOPHEN 5-325 MG PO TABS: 1.0000 | ORAL_TABLET | ORAL | Status: DC | PRN

## 2014-08-15 NOTE — Discharge Summary (Signed)
Obstetric Discharge Summary Reason for Admission: onset of labor Prenatal Procedures: ultrasound Intrapartum Procedures: spontaneous vaginal delivery Postpartum Procedures: none Complications-Operative and Postpartum: none HEMOGLOBIN  Date Value Ref Range Status  08/14/2014 9.4* 12.0 - 15.0 g/dL Final   HCT  Date Value Ref Range Status  08/14/2014 28.7* 36.0 - 46.0 % Final    Physical Exam:  General: alert, cooperative, appears stated age and no distress Lochia: appropriate Uterine Fundus: firm Incision: n/a DVT Evaluation: No evidence of DVT seen on physical exam. Negative Homan's sign. No cords or calf tenderness. No significant calf/ankle edema.  Discharge Diagnoses: Term Pregnancy-delivered  Discharge Information: Date: 08/15/2014 Activity: pelvic rest Diet: routine Medications: PNV, Ibuprofen and Percocet Condition: stable and improved Instructions: refer to practice specific booklet Discharge to: home   Newborn Data: Live born female  Birth Weight: 8 lb 12.4 oz (3980 g) APGAR: 8, 9  Home with mother.  LAWSON, MARIE DARLENE 08/15/2014, 7:09 AM

## 2014-08-16 NOTE — Progress Notes (Signed)
Post discharge chart review completed.  

## 2014-08-23 ENCOUNTER — Ambulatory Visit (INDEPENDENT_AMBULATORY_CARE_PROVIDER_SITE_OTHER): Payer: Medicaid Other | Admitting: Obstetrics & Gynecology

## 2014-08-23 ENCOUNTER — Encounter: Payer: Self-pay | Admitting: Obstetrics & Gynecology

## 2014-08-23 VITALS — BP 120/78 | HR 73 | Temp 97.8°F | Wt 149.4 lb

## 2014-08-23 DIAGNOSIS — Z3009 Encounter for other general counseling and advice on contraception: Secondary | ICD-10-CM

## 2014-08-23 DIAGNOSIS — K5904 Chronic idiopathic constipation: Secondary | ICD-10-CM

## 2014-08-23 DIAGNOSIS — K5909 Other constipation: Secondary | ICD-10-CM

## 2014-08-23 MED ORDER — PHENYLEPHRINE IN HARD FAT 0.25 % RE SUPP: 1.0000 | Freq: Two times a day (BID) | RECTAL | Status: DC

## 2014-08-23 MED ORDER — DOCUSATE SODIUM 100 MG PO CAPS: 100.0000 mg | ORAL_CAPSULE | Freq: Two times a day (BID) | ORAL | Status: DC | PRN

## 2014-08-23 NOTE — Patient Instructions (Signed)
Laparoscopic Tubal Ligation Laparoscopic tubal ligation is a procedure that closes the fallopian tubes at a time other than right after childbirth. By closing the fallopian tubes, the eggs that are released from the ovaries cannot enter the uterus and sperm cannot reach the egg. Tubal ligation is also known as getting your "tubes tied." Tubal ligation is done so you will not be able to get pregnant or have a baby.  Although this procedure may be reversed, it should be considered permanent and irreversible. If you want to have future pregnancies, you should not have this procedure.  LET YOUR CAREGIVER KNOW ABOUT:  Allergies to food or medicine.  Medicines taken, including vitamins, herbs, eyedrops, over-the-counter medicines, and creams.  Use of steroids (by mouth or creams).  Previous problems with numbing medicines.  History of bleeding problems or blood clots.  Any recent colds or infections.  Previous surgery.  Other health problems, including diabetes and kidney problems.  Possibility of pregnancy, if this applies.  Any past pregnancies. RISKS AND COMPLICATIONS   Infection.  Bleeding.  Injury to surrounding organs.  Anesthetic side effects.  Failure of the procedure.  Ectopic pregnancy.  Future regret about having the procedure done. BEFORE THE PROCEDURE  Do not take aspirin or blood thinners a week before the procedure or as directed. This can cause bleeding.  Do not eat or drink anything 6 to 8 hours before the procedure. PROCEDURE   You may be given a medicine to help you relax (sedative) before the procedure. You will be given a medicine to make you sleep (general anesthetic) during the procedure.  A tube will be put down your throat to help your breath while under general anesthesia.  Two small cuts (incisions) are made in the lower abdominal area and near the belly button.  Your abdominal area will be inflated with a safe gas (carbon dioxide). This helps  give the surgeon room to operate, visualize, and helps the surgeon avoid other organs.  A thin, lighted tube (laparoscope) with a camera attached is inserted into your abdomen through one of the incisions near the belly button. Other small instruments are also inserted through the other abdominal incision.  The fallopian tubes are located and are either blocked with a ring, clip, or are burned (cauterized).  After the fallopian tubes are blocked, the gas is released from the abdomen.  The incisions will be closed with stitches (sutures), and a bandage may be placed over the incisions. AFTER THE PROCEDURE   You will rest in a recovery room for 1--4 hours until you are stable and doing well.  You will also have some mild abdominal discomfort for 3--7 days. You will be given pain medicine to ease any discomfort.  As long as there are no problems, you may be allowed to go home. Someone will need to drive you home and be with you for at least 24 hours once home.  You may have some mild discomfort in the throat. This is from the tube placed in your throat while you were sleeping.  You may experience discomfort in the shoulder area from some trapped air between the liver and diaphragm. This sensation is normal and will slowly go away on its own. Document Released: 05/07/2000 Document Revised: 07/31/2011 Document Reviewed: 05/12/2011 [facility] Patient Information 2015 [place], [place]. This information is not intended to replace advice given to you by your health care provider. Make sure you discuss any questions you have with your health care provider.

## 2014-08-23 NOTE — Progress Notes (Signed)
Patient ID: Jennifer Dunlap, female   DOB: 05/13/1980, 34 y.o.   MRN: [id number] Cc: wants to schedule LBTS, sign papers for [facility]   [id number] Patient's last menstrual period was 10/30/2013 (approximate).  10 days pp from NSVD with epidural, she wants to schedule BTL.  No complaints today.  The procedure (LBTS) and the risk of anesthesia, bleeding, infection, bowel and bladder injury, failure (1/200) and ectopic pregnancy were discussed and her questions were answered. The procedure will be scheduled as an outpatient. PP visit on 09/11/14 scheduled.  Woodroe Mode, MD\ 08/23/2014

## 2014-08-23 NOTE — Progress Notes (Signed)
Patient ID: Jennifer Dunlap, female   DOB: Nov 28, 1980, 34 y.o.   MRN: [id number] Tubal consent signed

## 2014-08-25 ENCOUNTER — Encounter: Payer: Self-pay | Admitting: *Deleted

## 2014-08-25 ENCOUNTER — Encounter (HOSPITAL_COMMUNITY): Payer: Self-pay | Admitting: *Deleted

## 2014-09-04 ENCOUNTER — Other Ambulatory Visit: Payer: Self-pay | Admitting: Obstetrics & Gynecology

## 2014-09-10 ENCOUNTER — Encounter: Payer: Self-pay | Admitting: Obstetrics & Gynecology

## 2014-09-10 ENCOUNTER — Ambulatory Visit (INDEPENDENT_AMBULATORY_CARE_PROVIDER_SITE_OTHER): Payer: Medicaid Other | Admitting: Obstetrics & Gynecology

## 2014-09-10 ENCOUNTER — Encounter (HOSPITAL_COMMUNITY): Payer: Self-pay | Admitting: *Deleted

## 2014-09-10 VITALS — BP 112/49 | HR 48 | Temp 97.9°F | Ht 63.0 in | Wt 148.0 lb

## 2014-09-10 DIAGNOSIS — Z3009 Encounter for other general counseling and advice on contraception: Secondary | ICD-10-CM

## 2014-09-10 DIAGNOSIS — K5904 Chronic idiopathic constipation: Secondary | ICD-10-CM

## 2014-09-10 DIAGNOSIS — K5909 Other constipation: Secondary | ICD-10-CM

## 2014-09-10 MED ORDER — POLYETHYLENE GLYCOL 3350 17 GM/SCOOP PO POWD: 1.0000 | Freq: Once | ORAL | Status: DC

## 2014-09-10 MED ORDER — DOCUSATE SODIUM 100 MG PO CAPS: 100.0000 mg | ORAL_CAPSULE | Freq: Two times a day (BID) | ORAL | Status: DC | PRN

## 2014-09-10 MED ORDER — ETONOGESTREL-ETHINYL ESTRADIOL 0.12-0.015 MG/24HR VA RING: VAGINAL_RING | VAGINAL | Status: DC

## 2014-09-10 MED ORDER — PRAMOX-PE-GLYCERIN-PETROLATUM 1-0.25-14.4-15 % RE CREA: TOPICAL_CREAM | RECTAL | Status: DC

## 2014-09-10 NOTE — Progress Notes (Signed)
Patient ID: Seriah Brotzman, female   DOB: April 05, 1980, 34 y.o.   MRN: [id number] Subjective:     Azaleah Usman is a 33 y.o. female who presents for a postpartum visit. She is 4 weeks postpartum following a spontaneous vaginal delivery. I have fully reviewed the prenatal and intrapartum course. The delivery was at 5 gestational weeks. Outcome: spontaneous vaginal delivery. Anesthesia: epidural. Postpartum course has been unremarkable. Baby's course has been unremarkable. Baby is feeding by breast. Bleeding staining only. Bowel function is slow; recommended stool softeners. Bladder function is normal. Patient is not sexually active. Contraception method is tubal ligation. Postpartum depression screening: negative.  Pt reports h/o constipation- feels that it is worse now.  The following portions of the patient's history were reviewed and updated as appropriate: allergies, current medications, past family history, past medical history, past social history, past surgical history and problem list.  Review of Systems Pertinent items are noted in HPI.   Objective:  BP 112/49 mmHg  Pulse 48  Temp(Src) 97.9 F (36.6 C) (Oral)  Ht 5\' 3"  (1.6 m)  Wt 148 lb (67.132 kg)  BMI 26.22 kg/m2  Breastfeeding? Yes Pt in NAD        Assessment:     6 weeks postpartum exam.  constipation  Plan:    1. Contraception: NuvaRing vaginal inserts 2. Scheduled for BTL 8/30 3. PAP in 1 year 2017 4. Miralax 4caps tid for 1 day then 1 cap daily  Oaklyn Jakubek L. Harraway-Smith, M.D., Cherlynn June

## 2014-09-10 NOTE — Patient Instructions (Signed)

## 2014-09-14 NOTE — H&P (Signed)
Jovita Persing is a 34 y.o. female presenting for IOL for postdates. Maternal Medical History:  Contractions: Frequency: rare.   Fetal activity: Perceived fetal activity is normal.  Last perceived fetal movement was within the past hour.   Prenatal complications: no prenatal complications except late to care  OB History as of 08/23/14    Gravida Para Term Preterm AB TAB SAB Ectopic Multiple Living   9 7 7  0 2 2 0 0 0 7     Past Medical History  Diagnosis Date  . Pericarditis 2011  . Benign tumor of thyroid gland    Past Surgical History  Procedure Laterality Date  . No past surgeries     Family History: family history includes Brain cancer in her maternal grandmother; Cancer (age of onset: 79) in her sister; Heart attack in her father and paternal grandmother; Heart disease in her father; Hypertension in her mother; Stroke in her father. Social History:  reports that she has never smoked. She has never used smokeless tobacco. She reports that she drinks alcohol. She reports that she does not use illicit drugs.   Prenatal Transfer Tool  Maternal Diabetes: No Genetic Screening: Declined Maternal Ultrasounds/Referrals: Abnormal: Findings: Other: insignificant tiny bilateral CP cysts Fetal Ultrasounds or other Referrals: None, Referred to Materal Fetal Medicine  Maternal Substance Abuse: No Significant Maternal Medications: None Significant Maternal Lab Results: Lab values include: Group B Strep positive Other Comments: None  Review of Systems  Constitutional: Negative.  Eyes: Negative.  Respiratory: Negative.  Cardiovascular: Negative.  Gastrointestinal: Negative.  Genitourinary: Negative.  Musculoskeletal: Negative.  Skin: Negative.      Last menstrual period 10/30/2013, currently breastfeeding. Exam Cervx 2-3/thick/mod consistency/-3/vertex  Physical Exam  Constitutional: She is oriented to person,  place, and time. She appears well-developed and well-nourished.  HENT:  Head: Normocephalic.  Cardiovascular: Normal rate and regular rhythm.  Respiratory: Effort normal.  GI: Soft. There is no tenderness.  Genitourinary: Vagina normal and uterus normal.  Musculoskeletal: Normal range of motion.  Neurological: She is alert and oriented to person, place, and time. She has normal reflexes.  Skin: Skin is warm and dry.  Psychiatric: She has a normal mood and affect.    Prenatal labs: ABO, Rh: O/POS/-- (02/19 0957) Antibody: NEG (02/19 0957) Rubella: 4.33 (02/19 0957) RPR: Non Reactive (07/01 0820)  HBsAg: NEGATIVE (02/19 0957)  HIV: NONREACTIVE (04/15 1202)  GBS: Positive (06/14 0000)   Assessment/Plan: IOL for postdates: Plan cytotec to ripen cervix then pitocin PCN for GBS prophylaxis  CRESENZO-DISHMAN,Terrisha Lopata 09/14/2014, 9:50 AM

## 2014-09-14 NOTE — H&P (Signed)
Jennifer Dunlap is a 34 y.o. female presenting for IOL for postdates. Maternal Medical History:  Contractions: Frequency: rare.    Fetal activity: Perceived fetal activity is normal.   Last perceived fetal movement was within the past hour.    Prenatal complications: no prenatal complications  except late to care  OB History as of 08/23/14    Gravida Para Term Preterm AB TAB SAB Ectopic Multiple Living   9 7 7  0 2 2 0 0 0 7     Past Medical History  Diagnosis Date  . Pericarditis 2011  . Benign tumor of thyroid gland    Past Surgical History  Procedure Laterality Date  . No past surgeries     Family History: family history includes Brain cancer in her maternal grandmother; Cancer (age of onset: 32) in her sister; Heart attack in her father and paternal grandmother; Heart disease in her father; Hypertension in her mother; Stroke in her father. Social History:  reports that she has never smoked. She has never used smokeless tobacco. She reports that she drinks alcohol. She reports that she does not use illicit drugs.   Prenatal Transfer Tool  Maternal Diabetes: No Genetic Screening: Declined Maternal Ultrasounds/Referrals: Abnormal:  Findings:   Other: insignificant tiny bilateral CP cysts Fetal Ultrasounds or other Referrals:  None, Referred to Materal Fetal Medicine  Maternal Substance Abuse:  No Significant Maternal Medications:  None Significant Maternal Lab Results:  Lab values include: Group B Strep positive Other Comments:  None  Review of Systems  Constitutional: Negative.   Eyes: Negative.   Respiratory: Negative.   Cardiovascular: Negative.   Gastrointestinal: Negative.   Genitourinary: Negative.   Musculoskeletal: Negative.   Skin: Negative.       Last menstrual period 10/30/2013, currently breastfeeding. Exam Cervx 2-3/thick/mod consistency/-3/vertex  Physical Exam  Constitutional: She is oriented to person, place, and time. She appears well-developed  and well-nourished.  HENT:  Head: Normocephalic.  Cardiovascular: Normal rate and regular rhythm.   Respiratory: Effort normal.  GI: Soft. There is no tenderness.  Genitourinary: Vagina normal and uterus normal.  Musculoskeletal: Normal range of motion.  Neurological: She is alert and oriented to person, place, and time. She has normal reflexes.  Skin: Skin is warm and dry.  Psychiatric: She has a normal mood and affect.    Prenatal labs: ABO, Rh: O/POS/-- (02/19 0957) Antibody: NEG (02/19 0957) Rubella: 4.33 (02/19 0957) RPR: Non Reactive (07/01 0820)  HBsAg: NEGATIVE (02/19 0957)  HIV: NONREACTIVE (04/15 1202)  GBS: Positive (06/14 0000)   Assessment/Plan: IOL for postdates:  Plan cytotec to ripen cervix then pitocin PCN for GBS prophylaxis   CRESENZO-DISHMAN,Jonathen Rathman 09/14/2014, 9:50 AM

## 2014-10-12 ENCOUNTER — Ambulatory Visit (HOSPITAL_COMMUNITY): Payer: Medicaid Other | Admitting: Anesthesiology

## 2014-10-12 ENCOUNTER — Encounter (HOSPITAL_COMMUNITY)
Admission: RE | Disposition: A | Discharge status: Home / Self Care | Payer: Self-pay | Source: Ambulatory Visit | Attending: Obstetrics & Gynecology

## 2014-10-12 ENCOUNTER — Encounter (HOSPITAL_COMMUNITY): Payer: Self-pay | Admitting: Obstetrics & Gynecology

## 2014-10-12 ENCOUNTER — Encounter (HOSPITAL_COMMUNITY): Payer: Self-pay | Admitting: *Deleted

## 2014-10-12 ENCOUNTER — Ambulatory Visit (HOSPITAL_COMMUNITY)
Admission: RE | Admit: 2014-10-12 | Discharge: 2014-10-12 | Disposition: A | Discharge status: Home / Self Care | Payer: Medicaid Other | Source: Ambulatory Visit | Attending: Obstetrics & Gynecology | Admitting: Obstetrics & Gynecology

## 2014-10-12 DIAGNOSIS — Z302 Encounter for sterilization: Secondary | ICD-10-CM | POA: Insufficient documentation

## 2014-10-12 HISTORY — PX: LAPAROSCOPIC TUBAL LIGATION: SHX1937

## 2014-10-12 LAB — CBC
HEMATOCRIT: 36.6 % (ref 36.0–46.0)
HEMOGLOBIN: 11.9 g/dL — AB (ref 12.0–15.0)
MCH: 27.1 pg (ref 26.0–34.0)
MCHC: 32.5 g/dL (ref 30.0–36.0)
MCV: 83.4 fL (ref 78.0–100.0)
Platelets: 196 10*3/uL (ref 150–400)
RBC: 4.39 MIL/uL (ref 3.87–5.11)
RDW: 14.4 % (ref 11.5–15.5)
WBC: 4.9 10*3/uL (ref 4.0–10.5)

## 2014-10-12 LAB — TYPE AND SCREEN
ABO/RH(D): O POS
Antibody Screen: NEGATIVE

## 2014-10-12 LAB — PREGNANCY, URINE: Preg Test, Ur: NEGATIVE

## 2014-10-12 SURGERY — LIGATION, FALLOPIAN TUBE, LAPAROSCOPIC
Anesthesia: General | Site: Abdomen | Laterality: Bilateral

## 2014-10-12 MED ORDER — BUPIVACAINE HCL (PF) 0.25 % IJ SOLN
INTRAMUSCULAR | Status: AC
  Filled 2014-10-12: qty 30

## 2014-10-12 MED ORDER — OXYCODONE-ACETAMINOPHEN 5-325 MG PO TABS
ORAL_TABLET | ORAL | Status: AC
  Filled 2014-10-12: qty 1

## 2014-10-12 MED ORDER — FENTANYL CITRATE (PF) 250 MCG/5ML IJ SOLN
INTRAMUSCULAR | Status: AC
  Filled 2014-10-12: qty 25

## 2014-10-12 MED ORDER — PROPOFOL 10 MG/ML IV BOLUS
INTRAVENOUS | Status: AC
  Filled 2014-10-12: qty 20

## 2014-10-12 MED ORDER — ONDANSETRON HCL 4 MG/2ML IJ SOLN
INTRAMUSCULAR | Status: AC
  Filled 2014-10-12: qty 2

## 2014-10-12 MED ORDER — OXYCODONE-ACETAMINOPHEN 5-325 MG PO TABS
1.0000 | ORAL_TABLET | ORAL | Status: DC | PRN
  Administered 2014-10-12: 1 via ORAL

## 2014-10-12 MED ORDER — MIDAZOLAM HCL 2 MG/2ML IJ SOLN
INTRAMUSCULAR | Status: DC | PRN
  Administered 2014-10-12: 2 mg via INTRAVENOUS

## 2014-10-12 MED ORDER — GLYCOPYRROLATE 0.2 MG/ML IJ SOLN
INTRAMUSCULAR | Status: AC
  Filled 2014-10-12: qty 3

## 2014-10-12 MED ORDER — ACETAMINOPHEN 160 MG/5ML PO SOLN
ORAL | Status: AC
Start: 2014-10-12 — End: 2014-10-12
  Administered 2014-10-12: 975 mg via ORAL
  Filled 2014-10-12: qty 40.6

## 2014-10-12 MED ORDER — ROCURONIUM BROMIDE 100 MG/10ML IV SOLN
INTRAVENOUS | Status: DC | PRN
Start: 2014-10-12 — End: 2014-10-12
  Administered 2014-10-12: 30 mg via INTRAVENOUS

## 2014-10-12 MED ORDER — GLYCOPYRROLATE 0.2 MG/ML IJ SOLN
INTRAMUSCULAR | Status: DC | PRN
  Administered 2014-10-12 (×2): 0.1 mg via INTRAVENOUS
  Administered 2014-10-12: 0.6 mg via INTRAVENOUS

## 2014-10-12 MED ORDER — LACTATED RINGERS IV SOLN: INTRAVENOUS | Status: DC

## 2014-10-12 MED ORDER — NEOSTIGMINE METHYLSULFATE 10 MG/10ML IV SOLN
INTRAVENOUS | Status: AC
  Filled 2014-10-12: qty 1

## 2014-10-12 MED ORDER — SCOPOLAMINE 1 MG/3DAYS TD PT72
1.0000 | MEDICATED_PATCH | Freq: Once | TRANSDERMAL | Status: DC
Start: 2014-10-12 — End: 2014-10-12
  Administered 2014-10-12: 1.5 mg via TRANSDERMAL

## 2014-10-12 MED ORDER — ACETAMINOPHEN 160 MG/5ML PO SOLN
975.0000 mg | Freq: Once | ORAL | Status: AC
  Administered 2014-10-12: 975 mg via ORAL

## 2014-10-12 MED ORDER — FENTANYL CITRATE (PF) 100 MCG/2ML IJ SOLN
25.0000 ug | INTRAMUSCULAR | Status: DC | PRN
  Administered 2014-10-12 (×2): 25 ug via INTRAVENOUS
  Administered 2014-10-12: 50 ug via INTRAVENOUS

## 2014-10-12 MED ORDER — OXYCODONE-ACETAMINOPHEN 5-325 MG PO TABS: 1.0000 | ORAL_TABLET | Freq: Four times a day (QID) | ORAL | Status: DC | PRN

## 2014-10-12 MED ORDER — FENTANYL CITRATE (PF) 100 MCG/2ML IJ SOLN
INTRAMUSCULAR | Status: DC | PRN
  Administered 2014-10-12: 50 ug via INTRAVENOUS
  Administered 2014-10-12: 100 ug via INTRAVENOUS

## 2014-10-12 MED ORDER — MIDAZOLAM HCL 2 MG/2ML IJ SOLN
INTRAMUSCULAR | Status: AC
  Filled 2014-10-12: qty 4

## 2014-10-12 MED ORDER — BUPIVACAINE HCL (PF) 0.25 % IJ SOLN
INTRAMUSCULAR | Status: DC | PRN
  Administered 2014-10-12 (×2): 10 mL

## 2014-10-12 MED ORDER — LACTATED RINGERS IV SOLN
INTRAVENOUS | Status: DC
  Administered 2014-10-12 (×2): via INTRAVENOUS

## 2014-10-12 MED ORDER — ROCURONIUM BROMIDE 100 MG/10ML IV SOLN
INTRAVENOUS | Status: AC
  Filled 2014-10-12: qty 1

## 2014-10-12 MED ORDER — ONDANSETRON HCL 4 MG/2ML IJ SOLN
INTRAMUSCULAR | Status: DC | PRN
  Administered 2014-10-12: 4 mg via INTRAVENOUS

## 2014-10-12 MED ORDER — FENTANYL CITRATE (PF) 100 MCG/2ML IJ SOLN
INTRAMUSCULAR | Status: AC
  Administered 2014-10-12: 25 ug via INTRAVENOUS
  Filled 2014-10-12: qty 2

## 2014-10-12 MED ORDER — DEXAMETHASONE SODIUM PHOSPHATE 10 MG/ML IJ SOLN
INTRAMUSCULAR | Status: DC | PRN
  Administered 2014-10-12: 10 mg via INTRAVENOUS

## 2014-10-12 MED ORDER — SUCCINYLCHOLINE CHLORIDE 20 MG/ML IJ SOLN
INTRAMUSCULAR | Status: DC | PRN
  Administered 2014-10-12: 120 mg via INTRAVENOUS

## 2014-10-12 MED ORDER — SODIUM CHLORIDE 0.9 % IJ SOLN
INTRAMUSCULAR | Status: AC
  Filled 2014-10-12: qty 10

## 2014-10-12 MED ORDER — BUPIVACAINE HCL (PF) 0.25 % IJ SOLN
INTRAMUSCULAR | Status: AC
  Filled 2014-10-12: qty 10

## 2014-10-12 MED ORDER — KETOROLAC TROMETHAMINE 30 MG/ML IJ SOLN
INTRAMUSCULAR | Status: DC | PRN
  Administered 2014-10-12: 30 mg via INTRAVENOUS

## 2014-10-12 MED ORDER — LIDOCAINE HCL (CARDIAC) 20 MG/ML IV SOLN
INTRAVENOUS | Status: DC | PRN
  Administered 2014-10-12: 100 mg via INTRAVENOUS

## 2014-10-12 MED ORDER — SCOPOLAMINE 1 MG/3DAYS TD PT72
MEDICATED_PATCH | TRANSDERMAL | Status: AC
  Administered 2014-10-12: 1.5 mg via TRANSDERMAL
  Filled 2014-10-12: qty 1

## 2014-10-12 MED ORDER — NEOSTIGMINE METHYLSULFATE 10 MG/10ML IV SOLN
INTRAVENOUS | Status: DC | PRN
  Administered 2014-10-12: 3 mg via INTRAVENOUS

## 2014-10-12 MED ORDER — PROPOFOL 10 MG/ML IV BOLUS
INTRAVENOUS | Status: DC | PRN
  Administered 2014-10-12: 200 mg via INTRAVENOUS

## 2014-10-12 MED ORDER — KETOROLAC TROMETHAMINE 30 MG/ML IJ SOLN
INTRAMUSCULAR | Status: AC
  Filled 2014-10-12: qty 1

## 2014-10-12 MED ORDER — SODIUM CHLORIDE 0.9 % IJ SOLN
INTRAMUSCULAR | Status: DC | PRN
  Administered 2014-10-12: 10 mL

## 2014-10-12 MED ORDER — DEXAMETHASONE SODIUM PHOSPHATE 4 MG/ML IJ SOLN
INTRAMUSCULAR | Status: AC
  Filled 2014-10-12: qty 1

## 2014-10-12 MED ORDER — LIDOCAINE HCL (CARDIAC) 20 MG/ML IV SOLN
INTRAVENOUS | Status: AC
  Filled 2014-10-12: qty 5

## 2014-10-12 SURGICAL SUPPLY — 22 items
BENZOIN TINCTURE PRP APPL 2/3 (GAUZE/BANDAGES/DRESSINGS) IMPLANT
CATH ROBINSON RED A/P 16FR (CATHETERS) ×3 IMPLANT
CHLORAPREP W/TINT 26ML (MISCELLANEOUS) ×3 IMPLANT
CLIP FILSHIE TUBAL LIGA STRL (Clip) ×3 IMPLANT
CLOTH BEACON ORANGE TIMEOUT ST (SAFETY) ×3 IMPLANT
DRSG COVADERM PLUS 2X2 (GAUZE/BANDAGES/DRESSINGS) ×6 IMPLANT
DRSG OPSITE POSTOP 3X4 (GAUZE/BANDAGES/DRESSINGS) ×3 IMPLANT
GLOVE BIO SURGEON STRL SZ 6.5 (GLOVE) ×2 IMPLANT
GLOVE BIO SURGEONS STRL SZ 6.5 (GLOVE) ×1
GLOVE BIOGEL PI IND STRL 7.0 (GLOVE) ×1 IMPLANT
GLOVE BIOGEL PI INDICATOR 7.0 (GLOVE) ×2
GOWN STRL REUS W/TWL LRG LVL3 (GOWN DISPOSABLE) ×6 IMPLANT
NEEDLE INSUFFLATION 120MM (ENDOMECHANICALS) ×3 IMPLANT
PACK LAPAROSCOPY BASIN (CUSTOM PROCEDURE TRAY) ×3 IMPLANT
PAD POSITIONING PINK XL (MISCELLANEOUS) ×3 IMPLANT
SET IRRIG TUBING LAPAROSCOPIC (IRRIGATION / IRRIGATOR) IMPLANT
SUT VICRYL 0 UR6 27IN ABS (SUTURE) ×3 IMPLANT
SUT VICRYL 4-0 PS2 18IN ABS (SUTURE) ×3 IMPLANT
TOWEL OR 17X24 6PK STRL BLUE (TOWEL DISPOSABLE) ×6 IMPLANT
TROCAR XCEL DIL TIP R 11M (ENDOMECHANICALS) ×3 IMPLANT
WARMER LAPAROSCOPE (MISCELLANEOUS) ×3 IMPLANT
WATER STERILE IRR 1000ML POUR (IV SOLUTION) ×3 IMPLANT

## 2014-10-12 NOTE — H&P (Signed)
Jennifer Dunlap is an 34 y.o. female. No LMP recorded.[id number]   8 weeks post NSVD with epidural, she wants to schedule BTL. No complaints today. Used condoms for [employer], breastfeeding  The procedure (LBTS) and the risk of anesthesia, bleeding, infection, bowel and bladder injury, failure (1/200) and ectopic pregnancy were discussed and her questions were answered.  Pertinent Gynecological History:   Contraception: condoms  Blood transfusions: none Sexually transmitted diseases: no past history   Last pap: normal Date: 2014 [id number]    Menstrual History:  No LMP recorded.    Past Medical History  Diagnosis Date  . Pericarditis 2011  . Benign tumor of thyroid gland     Past Surgical History  Procedure Laterality Date  . No past surgeries      Family History  Problem Relation Age of Onset  . Heart disease Father   . Heart attack Father   . Stroke Father   . Cancer Sister 32    ovarian  . Brain cancer Maternal Grandmother   . Hypertension Mother   . Heart attack Paternal Grandmother     Social History:  reports that she has never smoked. She has never used smokeless tobacco. She reports that she drinks alcohol. She reports that she does not use illicit drugs.  Allergies:  Allergies  Allergen Reactions  . Penicillins Anaphylaxis    Has not had reaction to Amoxicillin yet.    Prescriptions prior to admission  Medication Sig Dispense Refill Last Dose  . etonogestrel-ethinyl estradiol (NUVARING) 0.12-0.015 MG/24HR vaginal ring Insert vaginally and leave in place for 3 consecutive weeks, then remove for 1 week. (Patient not taking: Reported on 09/28/2014) 1 each 12   . flintstones complete (FLINTSTONES) 60 MG chewable tablet Chew 2 tablets by mouth daily.   Taking  . polyethylene glycol powder (MIRALAX) powder Take 255 g by mouth once. (Patient not taking: Reported on 09/28/2014) 255 g 3   . Pramox-PE-Glycerin-Petrolatum (PREPARATION H) 1-0.25-14.4-15 % CREA Apply to  rectum 2x per day as needed (Patient not taking: Reported on 09/28/2014) 26 g 0     Review of Systems  Constitutional: Negative.   Respiratory: Negative.   Cardiovascular: Negative.   Gastrointestinal: Negative.   Genitourinary: Negative.     Height 5' 3.75" (1.619 m), weight 63.957 kg (141 lb), currently breastfeeding. Physical Exam  Nursing note and vitals reviewed. Constitutional: She is oriented to person, place, and time. She appears well-developed. No distress.  HENT:  Head: Normocephalic.  Cardiovascular: Normal rate.   Respiratory: Effort normal and breath sounds normal.  GI: Soft.  Musculoskeletal: Normal range of motion.  Neurological: She is alert and oriented to person, place, and time.  Skin: Skin is warm.  Psychiatric: She has a normal mood and affect.    No results found for this or any previous visit (from the past 24 hour(s)).  No results found.  Assessment/Plan:scheduled for BTL. No complaints today.  The procedure (LBTS) and the risk of anesthesia, bleeding, infection, bowel and bladder injury, failure (1/200) and ectopic pregnancy were discussed and her questions were answered.    Courtland Reas 10/12/2014, 10:58 AM

## 2014-10-12 NOTE — Anesthesia Postprocedure Evaluation (Signed)
  Anesthesia Post-op Note  Patient: Jennifer Dunlap  Procedure(s) Performed: Procedure(s): LAPAROSCOPIC TUBAL LIGATION (Bilateral)  Patient Location: PACU  Anesthesia Type:General  Level of Consciousness: awake, alert  and oriented  Airway and Oxygen Therapy: Patient Spontanous Breathing  Post-op Pain: mild  Post-op Assessment: Post-op Vital signs reviewed, Patient's Cardiovascular Status Stable, Respiratory Function Stable, Patent Airway, No signs of Nausea or vomiting and Pain level controlled              Post-op Vital Signs: Reviewed and stable. HR in 40's, patient is asymptomatic with otherwise normal VS.  Last Vitals:  Filed Vitals:   10/12/14 1530  BP: 108/73  Pulse: 44  Temp:   Resp: 16    Complications: No apparent anesthesia complications

## 2014-10-12 NOTE — Transfer of Care (Signed)
Immediate Anesthesia Transfer of Care Note  Patient: Jennifer Dunlap  Procedure(s) Performed: Procedure(s): LAPAROSCOPIC TUBAL LIGATION (Bilateral)  Patient Location: PACU  Anesthesia Type:General  Level of Consciousness: oriented and sedated  Airway & Oxygen Therapy: Patient Spontanous Breathing and Patient connected to nasal cannula oxygen  Post-op Assessment: Report given to RN, Post -op Vital signs reviewed and stable and Patient moving all extremities X 4  Post vital signs: Reviewed and stable  Last Vitals: There were no vitals filed for this visit.  Complications: No apparent anesthesia complications

## 2014-10-12 NOTE — Anesthesia Preprocedure Evaluation (Addendum)
Anesthesia Evaluation  Patient identified by MRN, date of birth, ID band Patient awake    Reviewed: Allergy & Precautions, H&P , Patient's Chart, lab work & pertinent test results, reviewed documented beta blocker date and time   Airway Mallampati: II  TM Distance: >3 FB Neck ROM: full    Dental no notable dental hx.    Pulmonary  breath sounds clear to auscultation  Pulmonary exam normal       Cardiovascular Rhythm:regular Rate:Normal     Neuro/Psych    GI/Hepatic   Endo/Other    Renal/GU      Musculoskeletal   Abdominal   Peds  Hematology   Anesthesia Other Findings 2cm right thyroid nodule  Reproductive/Obstetrics                            Anesthesia Physical Anesthesia Plan  ASA: II  Anesthesia Plan: General   Post-op Pain Management:    Induction: Intravenous  Airway Management Planned: Oral ETT  Additional Equipment:   Intra-op Plan:   Post-operative Plan: Extubation in OR  Informed Consent: I have reviewed the patients History and Physical, chart, labs and discussed the procedure including the risks, benefits and alternatives for the proposed anesthesia with the patient or authorized representative who has indicated his/her understanding and acceptance.   Dental Advisory Given and Dental advisory given  Plan Discussed with: CRNA and Surgeon  Anesthesia Plan Comments: (  Discussed general anesthesia, including possible nausea, instrumentation of airway, sore throat,pulmonary aspiration, etc. I asked if the were any outstanding questions, or  concerns before we proceeded. )        Anesthesia Quick Evaluation

## 2014-10-12 NOTE — Op Note (Signed)
Deborra Medina  PREOPERATIVE DIAGNOSIS:  Undesired fertility  POSTOPERATIVE DIAGNOSIS:  Undesired fertility  PROCEDURE:  Laparoscopic Bilateral Tubal Sterilization using Filshie Clips   SURGEON: Woodroe Mode, MD   ANESTHESIA:  General endotracheal  COMPLICATIONS:  None immediate.  ESTIMATED BLOOD LOSS:  Less than 20 ml.  FLUIDS: 1000 ml LR.  URINE OUTPUT:  100 ml of clear urine.  INDICATIONS: 14 y.T.[id number]  with undesired fertility, desires permanent sterilization. Other reversible forms of contraception were discussed with patient; she declines all other modalities.  Risks of procedure discussed with patient including permanence of method, bleeding, infection, injury to surrounding organs and need for additional procedures including laparotomy, risk of regret.  Failure risk of 0.5-1% with increased risk of ectopic gestation if pregnancy occurs was also discussed with patient.  Discussed risk of future regret after she consulted with an adviser by phone.    FINDINGS:  Normal uterus, tubes, and ovaries.  TECHNIQUE:  The patient was taken to the operating room where general anesthesia was obtained without difficulty.  She was then placed in the dorsal lithotomy position and prepared and draped in sterile fashion.  After an adequate timeout was performed, a bivalved speculum was then placed in the patient's vagina, and the anterior lip of cervix grasped with the single-tooth tenaculum.  Hulka tenaculum was then advanced into the uterus.  The speculum was removed from the vagina.  Attention was then turned to the patient's abdomen where a 11-mm skin incision was made in the umbilical fold.  The 11-mm trocar and sleeve were then advanced without difficulty into the abdomen.  The abdomen was then insufflated with carbon dioxide gas and adequate pneumoperitoneum was obtained.  A survey of the patient's pelvis and abdomen revealed entirely normal anatomy.  The fallopian tubes were observed and  found to be normal in appearance. The Filshie clip applicator was placed through the operative port, and a Filshie clip was placed on the right fallopian tube ,about 2 cm from the cornual attachment, with care given to incorporate the underlying mesosalpinx.  A similar process was carried out on the contralateral side allowing for bilateral tubal sterilization.   Good hemostasis was noted overall.  Local analgesia was drizzled on both operative sites.The instruments were then removed from the patient's abdomen and the fascial incision was repaired with 0 Vicryl, and the skin was closed with 4-0 Vicryl.  The uterine manipulator was removed from the vagina without complications. The patient tolerated the procedure well.  Sponge, lap, and needle counts were correct times two.  The patient was then taken to the recovery room awake, extubated and in stable condition.  Woodroe Mode, MD 10/12/2014 2:29 PM

## 2014-10-12 NOTE — Discharge Instructions (Signed)
DISCHARGE INSTRUCTIONS: Laparoscopy  The following instructions have been prepared to help you care for yourself upon your return home today.  Oxycodone 1 tab given at 4:35 pm for pain  May remove scop patch on or before 10/14/2014  May take Ibuprofen after 8:15 pm for pain    Wound care:  Do not get the incision wet for the first 24 hours. The incision should be kept clean and dry.  The Band-Aids or dressings may be removed the day after surgery.  Should the incision become sore, red, and swollen after the first week, check with your doctor.  Personal hygiene:  Shower the day after your procedure.  Activity and limitations:  Do NOT drive or operate any equipment today.  Do NOT lift anything more than 15 pounds for 2-3 weeks after surgery.  Do NOT rest in bed all day.  Walking is encouraged. Walk each day, starting slowly with 5-minute walks 3 or 4 times a day. Slowly increase the length of your walks.  Walk up and down stairs slowly.  Do NOT do strenuous activities, such as golfing, playing tennis, bowling, running, biking, weight lifting, gardening, mowing, or vacuuming for 2-4 weeks. Ask your doctor when it is okay to start.  Diet: Eat a light meal as desired this evening. You may resume your usual diet tomorrow.  Return to work: This is dependent on the type of work you do. For the most part you can return to a desk job within a week of surgery. If you are more active at work, please discuss this with your doctor.  What to expect after your surgery: You may have a slight burning sensation when you urinate on the first day. You may have a very small amount of blood in the urine. Expect to have a small amount of vaginal discharge/light bleeding for 1-2 weeks. It is not unusual to have abdominal soreness and bruising for up to 2 weeks. You may be tired and need more rest for about 1 week. You may experience shoulder pain for 24-72 hours. Lying flat in bed may relieve  it.  Call your doctor for any of the following:  Develop a fever of 100.4 or greater  Inability to urinate 6 hours after discharge from hospital  Severe pain not relieved by pain medications  Persistent of heavy bleeding at incision site  Redness or swelling around incision site after a week  Increasing nausea or vomiting       Laparoscopic Tubal Ligation Care After Refer to this sheet in the next few weeks. These instructions provide you with information on caring for yourself after your procedure. Your caregiver may also give you more specific instructions. Your treatment has been planned according to current medical practices, but problems sometimes occur. Call your caregiver if you have any problems or questions after your procedure. HOME CARE INSTRUCTIONS   Rest the remainder of the day.  Only take over-the-counter or prescription medicines for pain, discomfort, or fever as directed by your caregiver. Do not take aspirin. It can cause bleeding.  Gradually resume daily activities, diet, rest, driving, and work.  Avoid sexual intercourse for 2 weeks or as directed.  Do not use tampons or douche.  Do not drive while taking pain medicine.  Do not lift anything over 5 pounds for 2 weeks or as directed.  Only take showers, not baths, until you are seen by your caregiver.  Change bandages (dressings) as directed.  Take your temperature twice a day and  record it.  Try to have help for the first 7 to 10 days for your household needs.  Return to your caregiver to get your stitches (sutures) removed and for follow-up visits as directed. SEEK MEDICAL CARE IF:   You have redness, swelling, or increasing pain in a wound.  You have drainage from a wound lasting longer than 1 day.  Your pain is getting worse.  You have a rash.  You become dizzy or lightheaded.  You have a reaction to your medicine.  You need stronger medicine or a change in your pain  medicine.  You notice a bad smell coming from a wound or dressing.  Your wound breaks open after the sutures have been removed.  You are constipated. SEEK IMMEDIATE MEDICAL CARE IF:   You faint.  You have a fever.  You have increasing abdominal pain.  You have severe pain in your shoulders.  You have bleeding or drainage from the suture sites or vagina following surgery.  You have shortness of breath or difficulty breathing.  You have chest or leg pain.  You have persistent nausea, vomiting, or diarrhea. MAKE SURE YOU:   Understand these instructions.  Watch your condition.  Get help right away if you are not doing well or get worse. Document Released: 08/18/2004 Document Revised: 07/31/2011 Document Reviewed: 05/12/2011 [facility] Patient Information 2015 [place], [place]. This information is not intended to replace advice given to you by your health care provider. Make sure you discuss any questions you have with your health care provider.

## 2014-10-13 ENCOUNTER — Encounter (HOSPITAL_COMMUNITY): Payer: Self-pay | Admitting: Obstetrics & Gynecology

## 2014-10-29 ENCOUNTER — Ambulatory Visit: Payer: Medicaid Other | Admitting: Obstetrics & Gynecology

## 2015-09-07 ENCOUNTER — Encounter (HOSPITAL_COMMUNITY): Payer: Self-pay | Admitting: Emergency Medicine

## 2015-09-07 DIAGNOSIS — Z85850 Personal history of malignant neoplasm of thyroid: Secondary | ICD-10-CM | POA: Insufficient documentation

## 2015-09-07 DIAGNOSIS — R102 Pelvic and perineal pain: Secondary | ICD-10-CM | POA: Insufficient documentation

## 2015-09-07 DIAGNOSIS — N390 Urinary tract infection, site not specified: Secondary | ICD-10-CM | POA: Insufficient documentation

## 2015-09-07 NOTE — ED Triage Notes (Signed)
Patient reports LLQ pain radiating to left lower back onset this evening , mild nausea , no emesis or diarrhea .

## 2015-09-08 ENCOUNTER — Emergency Department (HOSPITAL_COMMUNITY): Payer: Self-pay

## 2015-09-08 ENCOUNTER — Emergency Department (HOSPITAL_COMMUNITY)
Admission: EM | Admit: 2015-09-08 | Discharge: 2015-09-08 | Disposition: A | Discharge status: Home / Self Care | Payer: Self-pay | Attending: Emergency Medicine | Admitting: Emergency Medicine

## 2015-09-08 DIAGNOSIS — R1032 Left lower quadrant pain: Secondary | ICD-10-CM

## 2015-09-08 DIAGNOSIS — N390 Urinary tract infection, site not specified: Secondary | ICD-10-CM

## 2015-09-08 DIAGNOSIS — R102 Pelvic and perineal pain: Secondary | ICD-10-CM

## 2015-09-08 LAB — CBC
HCT: 36.7 % (ref 36.0–46.0)
Hemoglobin: 12.5 g/dL (ref 12.0–15.0)
MCH: 29.4 pg (ref 26.0–34.0)
MCHC: 34.1 g/dL (ref 30.0–36.0)
MCV: 86.4 fL (ref 78.0–100.0)
Platelets: 240 10*3/uL (ref 150–400)
RBC: 4.25 MIL/uL (ref 3.87–5.11)
RDW: 12.6 % (ref 11.5–15.5)
WBC: 6.5 10*3/uL (ref 4.0–10.5)

## 2015-09-08 LAB — GC/CHLAMYDIA PROBE AMP (~~LOC~~) NOT AT ARMC
Chlamydia: NEGATIVE
Neisseria Gonorrhea: NEGATIVE

## 2015-09-08 LAB — COMPREHENSIVE METABOLIC PANEL
ALBUMIN: 4 g/dL (ref 3.5–5.0)
ALK PHOS: 50 U/L (ref 38–126)
ALT: 16 U/L (ref 14–54)
ANION GAP: 5 (ref 5–15)
AST: 17 U/L (ref 15–41)
BILIRUBIN TOTAL: 0.5 mg/dL (ref 0.3–1.2)
BUN: 10 mg/dL (ref 6–20)
CO2: 25 mmol/L (ref 22–32)
Calcium: 10.4 mg/dL — ABNORMAL HIGH (ref 8.9–10.3)
Chloride: 106 mmol/L (ref 101–111)
Creatinine, Ser: 0.62 mg/dL (ref 0.44–1.00)
GFR calc Af Amer: >60 mL/min (ref >60)
GFR calc non Af Amer: >60 mL/min (ref >60)
GLUCOSE: 91 mg/dL (ref 65–99)
POTASSIUM: 4.2 mmol/L (ref 3.5–5.1)
Sodium: 136 mmol/L (ref 135–145)
TOTAL PROTEIN: 6.8 g/dL (ref 6.5–8.1)

## 2015-09-08 LAB — URINALYSIS, ROUTINE W REFLEX MICROSCOPIC
BILIRUBIN URINE: NEGATIVE
Glucose, UA: NEGATIVE mg/dL
Ketones, ur: NEGATIVE mg/dL
Leukocytes, UA: NEGATIVE
NITRITE: NEGATIVE
PH: 6 (ref 5.0–8.0)
Protein, ur: NEGATIVE mg/dL
SPECIFIC GRAVITY, URINE: 1.016 (ref 1.005–1.030)

## 2015-09-08 LAB — WET PREP, GENITAL
Sperm: NONE SEEN
Trich, Wet Prep: NONE SEEN

## 2015-09-08 LAB — URINE MICROSCOPIC-ADD ON

## 2015-09-08 MED ORDER — KETOROLAC TROMETHAMINE 30 MG/ML IJ SOLN
30.0000 mg | Freq: Once | INTRAMUSCULAR | Status: AC
  Administered 2015-09-08: 30 mg via INTRAVENOUS
  Filled 2015-09-08: qty 1

## 2015-09-08 MED ORDER — FLUCONAZOLE 100 MG PO TABS
150.0000 mg | ORAL_TABLET | Freq: Once | ORAL | Status: AC
  Administered 2015-09-08: 150 mg via ORAL
  Filled 2015-09-08: qty 2

## 2015-09-08 MED ORDER — TRAMADOL HCL 50 MG PO TABS: 50.0000 mg | ORAL_TABLET | Freq: Two times a day (BID) | ORAL | 0 refills | Status: DC | PRN

## 2015-09-08 MED ORDER — FENTANYL CITRATE (PF) 100 MCG/2ML IJ SOLN
50.0000 ug | Freq: Once | INTRAMUSCULAR | Status: AC
  Administered 2015-09-08: 50 ug via INTRAVENOUS
  Filled 2015-09-08: qty 2

## 2015-09-08 MED ORDER — CIPROFLOXACIN HCL 500 MG PO TABS: 500.0000 mg | ORAL_TABLET | Freq: Two times a day (BID) | ORAL | 0 refills | Status: DC

## 2015-09-08 NOTE — ED Provider Notes (Signed)
[facility] DEPT Provider Note   CSN: [id number] Arrival date & time: 09/07/15  2348  By signing my name below, I, Emmanuella Mensah, attest that this documentation has been prepared under the direction and in the presence of [name] Balls, MD. Electronically Signed: Judithann Sauger, ED Scribe. 09/08/15. 3:03 AM.   History   Chief Complaint Chief Complaint  Patient presents with  . Abdominal Pain    HPI Comments: Jennifer Dunlap is a 35 y.o. female who presents to the Emergency Department complaining of intermittent episodes of gradually worsening severe LLQ pain that radiates to left back onset one year ago. She explains that the pain began after her tubal ligation one year ago and she has noticed that the pain is worse with her menstrual cycle. She states that her menstrual cycle has been normal and slightly heavier since the procedure. She is currently on her menstrual cycle. No alleviating factors noted. Pt states that she has tried Tylenol and Midol with no relief. She denies that she takes any estrogen or hormone. Pt has not seen her OB GYN for these symptoms. She denies any n/v/d or any urinary symptoms.    The history is provided by the patient. No language interpreter was used.  Abdominal Pain   This is a chronic problem. The current episode started more than 1 week ago. The problem has been gradually improving. The pain is associated with a previous surgery. The pain is located in the LLQ. The pain is severe. Pertinent negatives include fever, diarrhea, nausea, vomiting, dysuria and hematuria. Nothing aggravates the symptoms. Nothing relieves the symptoms.    Past Medical History:  Diagnosis Date  . Benign tumor of thyroid gland   . Pericarditis 2011    Patient Active Problem List   Diagnosis Date Noted  . 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 History of maternal pericarditis 02/20/2012    Past Surgical History:  Procedure Laterality Date  . CESAREAN SECTION    . LAPAROSCOPIC  TUBAL LIGATION Bilateral 10/12/2014   Procedure: LAPAROSCOPIC TUBAL LIGATION;  Surgeon: Woodroe Mode, MD;  Location: [facility];  Service: Gynecology;  Laterality: Bilateral;  . NO PAST SURGERIES      OB History    Gravida Para Term Preterm AB Living   9 7 7  0 2 7   SAB TAB Ectopic Multiple Live Births   0 2 0 0         Home Medications    Prior to Admission medications   Medication Sig Start Date End Date Taking? Authorizing Provider  flintstones complete (FLINTSTONES) 60 MG chewable tablet Chew 2 tablets by mouth daily.    Historical Provider, MD  oxyCODONE-acetaminophen (PERCOCET/ROXICET) 5-325 MG per tablet Take 1-2 tablets by mouth every 6 (six) hours as needed. 10/12/14   Woodroe Mode, MD  polyethylene glycol powder (MIRALAX) powder Take 255 g by mouth once. Patient not taking: Reported on 09/28/2014 09/10/14   Lavonia Drafts, MD  Pramox-PE-Glycerin-Petrolatum (PREPARATION H) 1-0.25-14.4-15 % CREA Apply to rectum 2x per day as needed Patient not taking: Reported on 09/28/2014 09/10/14   Lavonia Drafts, MD    Family History Family History  Problem Relation Age of Onset  . Heart disease Father   . Heart attack Father   . Stroke Father   . Cancer Sister 58    ovarian  . Hypertension Mother   . Brain cancer Maternal Grandmother   . Heart attack Paternal Grandmother     Social History Social History  Substance Use Topics  .  Smoking status: Never Smoker  . Smokeless tobacco: Never Used  . Alcohol use Yes     Comment: occcasional     Allergies   Penicillins   Review of Systems Review of Systems  Constitutional: Negative for fever.  Gastrointestinal: Positive for abdominal pain. Negative for diarrhea, nausea and vomiting.  Genitourinary: Negative for dysuria and hematuria.  Musculoskeletal: Positive for back pain.  All other systems reviewed and are negative.    Physical Exam Updated Vital Signs BP 117/74 (BP Location: Left Arm)   Pulse (!) 55    Temp 97.6 F (36.4 C) (Oral)   Ht 5\' 3"  (1.6 m)   Wt 152 lb 1.6 oz (69 kg)   LMP 09/03/2015   SpO2 99%   BMI 26.94 kg/m   Physical Exam  Constitutional: She is oriented to person, place, and time. She appears well-developed and well-nourished. No distress.  HENT:  Head: Normocephalic and atraumatic.  Nose: Nose normal.  Mouth/Throat: Oropharynx is clear and moist. No oropharyngeal exudate.  Eyes: Conjunctivae and EOM are normal. Pupils are equal, round, and reactive to light. No scleral icterus.  Neck: Normal range of motion. Neck supple. No JVD present. No tracheal deviation present. No thyromegaly present.  Cardiovascular: Normal rate, regular rhythm and normal heart sounds.  Exam reveals no gallop and no friction rub.   No murmur heard. Pulmonary/Chest: Effort normal and breath sounds normal. No respiratory distress. She has no wheezes. She exhibits no tenderness.  Abdominal: Soft. Bowel sounds are normal. She exhibits no distension and no mass. There is tenderness. There is no rebound and no guarding.  LLQ TTP  Genitourinary: Vagina normal and uterus normal. No vaginal discharge found.  Genitourinary Comments: No CMT or adnexal tenderness  Musculoskeletal: Normal range of motion. She exhibits no edema or tenderness.  Lymphadenopathy:    She has no cervical adenopathy.  Neurological: She is alert and oriented to person, place, and time. No cranial nerve deficit. She exhibits normal muscle tone.  Skin: Skin is warm and dry. No rash noted. No erythema. No pallor.  Nursing note and vitals reviewed.    ED Treatments / Results  DIAGNOSTIC STUDIES: Oxygen Saturation is 99% on RA, normal by my interpretation.    COORDINATION OF CARE: 2:31 AM- Pt advised of plan for treatment and pt agrees. Pt will receive a pelvic exam and US pelvic for further evaluation. She will also receive Toradol and Fentanyl.     Labs (all labs ordered are listed, but only abnormal results are  displayed) Labs Reviewed  COMPREHENSIVE METABOLIC PANEL - Abnormal; Notable for the following:       Result Value   Calcium 10.4 (*)    All other components within normal limits  URINALYSIS, ROUTINE W REFLEX MICROSCOPIC (NOT AT [facility]) - Abnormal; Notable for the following:    APPearance CLOUDY (*)    Hgb urine dipstick LARGE (*)    All other components within normal limits  URINE MICROSCOPIC-ADD ON - Abnormal; Notable for the following:    Squamous Epithelial / LPF 6-30 (*)    Bacteria, UA MANY (*)    All other components within normal limits  WET PREP, GENITAL  CBC  GC/CHLAMYDIA PROBE AMP ([place]) NOT AT [facility]    EKG  EKG Interpretation None       Radiology No results found.  Procedures Procedures (including critical care time)  Medications Ordered in ED Medications  ketorolac (TORADOL) 30 MG/ML injection 30 mg (30 mg Intravenous Given 09/08/15  0244)  fentaNYL (SUBLIMAZE) injection 50 mcg (50 mcg Intravenous Given 09/08/15 0244)     Initial Impression / Assessment and Plan / ED Course  [name] Balls, MD has reviewed the triage vital signs and the nursing notes.  Pertinent labs & imaging results that were available during my care of the patient were reviewed by me and considered in my medical decision making (see chart for details).  Clinical Course    Patient presents to the ED for LLQ abdominal pain.  This has been worse when she is on her cycle for the past year.  Physical exam shows mild tenderness.  PElvic was unremarkable.  Will obtain transvag. [place] for further evaluation. Patient given toradol and fentanyl for pain control.   3:54 AM US shows only a small fibroid, no pathology in the L side.  Plan to DC home with PCP fu for further evaluation. Tylenol and ibuprofen for pain as this has been going on for 1 year.  She appears well and in NAD. VS remain within her normal limits and she is safe for DC.  Final Clinical Impressions(s) / ED Diagnoses   Final  diagnoses:  LLQ abdominal pain    New Prescriptions New Prescriptions   No medications on file     I personally performed the services described in this documentation, which was scribed in my presence. The recorded information has been reviewed and is accurate.      [name] Balls, MD 09/08/15 [id number]

## 2015-09-08 NOTE — ED Notes (Signed)
Pt had a tubal ligation about 1 year ago. Has been having LLQ at the end of each menstrual cycle every month since. Has not followed up with MD that did her tubal. Came in today because pain is worse than usual.

## 2015-09-08 NOTE — ED Notes (Signed)
Pelvic cart at bedside. 

## 2015-09-09 ENCOUNTER — Inpatient Hospital Stay (HOSPITAL_COMMUNITY)
Admission: AD | Admit: 2015-09-09 | Discharge: 2015-09-09 | Disposition: A | Discharge status: Home / Self Care | Payer: Medicaid Other | Source: Ambulatory Visit | Attending: Obstetrics & Gynecology | Admitting: Obstetrics & Gynecology

## 2015-09-09 ENCOUNTER — Encounter (HOSPITAL_COMMUNITY): Payer: Self-pay | Admitting: *Deleted

## 2015-09-09 DIAGNOSIS — D259 Leiomyoma of uterus, unspecified: Secondary | ICD-10-CM | POA: Insufficient documentation

## 2015-09-09 DIAGNOSIS — Z880 Allergy status to penicillin: Secondary | ICD-10-CM | POA: Insufficient documentation

## 2015-09-09 DIAGNOSIS — I319 Disease of pericardium, unspecified: Secondary | ICD-10-CM | POA: Insufficient documentation

## 2015-09-09 DIAGNOSIS — N390 Urinary tract infection, site not specified: Secondary | ICD-10-CM | POA: Insufficient documentation

## 2015-09-09 DIAGNOSIS — D34 Benign neoplasm of thyroid gland: Secondary | ICD-10-CM | POA: Insufficient documentation

## 2015-09-09 DIAGNOSIS — R1032 Left lower quadrant pain: Secondary | ICD-10-CM | POA: Insufficient documentation

## 2015-09-09 HISTORY — DX: Benign neoplasm of connective and other soft tissue, unspecified: D21.9

## 2015-09-09 LAB — URINALYSIS, ROUTINE W REFLEX MICROSCOPIC
GLUCOSE, UA: NEGATIVE mg/dL
Hgb urine dipstick: NEGATIVE
KETONES UR: 15 mg/dL — AB
Leukocytes, UA: NEGATIVE
Nitrite: NEGATIVE
PH: 6 (ref 5.0–8.0)
Protein, ur: NEGATIVE mg/dL

## 2015-09-09 LAB — POCT PREGNANCY, URINE: Preg Test, Ur: NEGATIVE

## 2015-09-09 MED ORDER — TRAMADOL HCL 50 MG PO TABS: 50.0000 mg | ORAL_TABLET | Freq: Four times a day (QID) | ORAL | 0 refills | Status: DC | PRN

## 2015-09-09 MED ORDER — KETOROLAC TROMETHAMINE 30 MG/ML IJ SOLN
30.0000 mg | Freq: Once | INTRAMUSCULAR | Status: AC
  Administered 2015-09-09: 30 mg via INTRAMUSCULAR
  Filled 2015-09-09: qty 1

## 2015-09-09 NOTE — MAU Note (Signed)
C/o severe L lower pain; seen at [facility] yesterday and diagnosed with an uterine fibroid; Given a rx for pain but states that medicine is not working;

## 2015-09-09 NOTE — MAU Provider Note (Signed)
History     CSN: [id number]  Arrival date and time: 09/09/15 1524   None     Chief Complaint  Patient presents with  . Abdominal Pain   Non-pregnant female c/o LLQ pain x5 days. She reports as intermittent and sharp at times. She has used Ibuprofen with little relief. She was evaluated yesterday in ED for same sx and treated for UTI. Pelvic imaging showed small 1.5cm fibroid. CBC and CMP were also normal. She denies constipation currently although had in past. She denies urinary sx. No vaginal discharge or odor.     Pertinent Gynecological History: Menses: LMP 09/03/15 Contraception: tubal ligation Sexually transmitted diseases: no past history  Past Medical History:  Diagnosis Date  . Benign tumor of thyroid gland   . Fibroid   . Pericarditis 2011    Past Surgical History:  Procedure Laterality Date  . LAPAROSCOPIC TUBAL LIGATION Bilateral 10/12/2014   Procedure: LAPAROSCOPIC TUBAL LIGATION;  Surgeon: Woodroe Mode, MD;  Location: [facility];  Service: Gynecology;  Laterality: Bilateral;  . NO PAST SURGERIES      Family History  Problem Relation Age of Onset  . Heart disease Father   . Heart attack Father   . Stroke Father   . Cancer Sister 30    ovarian  . Hypertension Mother   . Brain cancer Maternal Grandmother   . Heart attack Paternal Grandmother     Social History  Substance Use Topics  . Smoking status: Never Smoker  . Smokeless tobacco: Never Used  . Alcohol use Yes     Comment: occcasional    Allergies:  Allergies  Allergen Reactions  . Penicillins Anaphylaxis    Has patient had a PCN reaction causing immediate rash, facial/tongue/throat swelling, SOB or lightheadedness with hypotension: yes Has patient had a PCN reaction causing severe rash involving mucus membranes or skin necrosis: no Has patient had a PCN reaction that required hospitalization unknown Has patient had a PCN reaction occurring within the last 10 years: no If all of the above  answers are "NO", then may proceed with Cephalosporin use.     Prescriptions Prior to Admission  Medication Sig Dispense Refill Last Dose  . ciprofloxacin (CIPRO) 500 MG tablet Take 1 tablet (500 mg total) by mouth 2 (two) times daily. 10 tablet 0 09/09/2015 at Unknown time  . traMADol (ULTRAM) 50 MG tablet Take 1 tablet (50 mg total) by mouth every 12 (twelve) hours as needed. (Patient taking differently: Take 50 mg by mouth every 12 (twelve) hours as needed for moderate pain. ) 10 tablet 0 09/08/2015 at Unknown time    Review of Systems  Constitutional: Negative.   Respiratory: Negative.   Cardiovascular: Negative.   Gastrointestinal: Positive for abdominal pain. Negative for constipation and diarrhea.  Genitourinary: Negative.   Neurological: Negative.    Physical Exam   Blood pressure 122/72, pulse 73, temperature 98.1 F (36.7 C), temperature source Oral, resp. rate 18, last menstrual period 09/03/2015, currently breastfeeding.  Physical Exam  Constitutional: She is oriented to person, place, and time. She appears well-developed and well-nourished.  HENT:  Head: Normocephalic and atraumatic.  Neck: Normal range of motion. Neck supple.  Cardiovascular: Normal rate.   Respiratory: Effort normal.  GI: Soft. She exhibits no distension and no mass. Tenderness: LLQ. There is no rebound and no guarding.  Genitourinary:  Genitourinary Comments: Bimanual: anteverted, non-tender, not enlarged, no adnexal mass or tenderness  Musculoskeletal: Normal range of motion.  Neurological: She is alert and  oriented to person, place, and time.  Skin: Skin is warm and dry.  Psychiatric: She has a normal mood and affect.   Results for orders placed or performed during the hospital encounter of 09/09/15 (from the past 24 hour(s))  Urinalysis, Routine w reflex microscopic (not at [facility]     Status: Abnormal   Collection Time: 09/09/15  4:00 PM  Result Value Ref Range   Color, Urine YELLOW YELLOW    APPearance HAZY (A) CLEAR   Specific Gravity, Urine >1.030 (H) 1.005 - 1.030   pH 6.0 5.0 - 8.0   Glucose, UA NEGATIVE NEGATIVE mg/dL   Hgb urine dipstick NEGATIVE NEGATIVE   Bilirubin Urine SMALL (A) NEGATIVE   Ketones, ur 15 (A) NEGATIVE mg/dL   Protein, ur NEGATIVE NEGATIVE mg/dL   Nitrite NEGATIVE NEGATIVE   Leukocytes, UA NEGATIVE NEGATIVE  Pregnancy, urine POC     Status: None   Collection Time: 09/09/15  8:26 PM  Result Value Ref Range   Preg Test, Ur NEGATIVE NEGATIVE    MAU Course  Procedures Toradol 30 mg IM x1 MDM Labs ordered and reviewed. No evidence of gyn etiology. No acute abdomen. Differentials UTI, constipation or IBS. Stable for discharge home.  Assessment and Plan  LLQ pain  Discharge home Ultram 50 mg 1 po q6 hrs prn Continue Cipro (for UTI) Increase water intake Follow up with PCP   Julianne Handler 09/09/2015, 5:13 PM

## 2015-09-09 NOTE — Discharge Instructions (Signed)
Abdominal Pain, Adult Many things can cause abdominal pain. Usually, abdominal pain is not caused by a disease and will improve without treatment. It can often be observed and treated at home. Your health care provider will do a physical exam and possibly order blood tests and X-rays to help determine the seriousness of your pain. However, in many cases, more time must pass before a clear cause of the pain can be found. Before that point, your health care provider may not know if you need more testing or further treatment. HOME CARE INSTRUCTIONS Monitor your abdominal pain for any changes. The following actions may help to alleviate any discomfort you are experiencing:  Only take over-the-counter or prescription medicines as directed by your health care provider.  Do not take laxatives unless directed to do so by your health care provider.  Try a clear liquid diet (broth, tea, or water) as directed by your health care provider. Slowly move to a bland diet as tolerated. SEEK MEDICAL CARE IF:  You have unexplained abdominal pain.  You have abdominal pain associated with nausea or diarrhea.  You have pain when you urinate or have a bowel movement.  You experience abdominal pain that wakes you in the night.  You have abdominal pain that is worsened or improved by eating food.  You have abdominal pain that is worsened with eating fatty foods.  You have a fever. SEEK IMMEDIATE MEDICAL CARE IF:  Your pain does not go away within 2 hours.  You keep throwing up (vomiting).  Your pain is felt only in portions of the abdomen, such as the right side or the left lower portion of the abdomen.  You pass bloody or black tarry stools. MAKE SURE YOU:  Understand these instructions.  Will watch your condition.  Will get help right away if you are not doing well or get worse.   This information is not intended to replace advice given to you by your health care provider. Make sure you discuss  any questions you have with your health care provider.   Document Released: 11/08/2004 Document Revised: 10/20/2014 Document Reviewed: 10/08/2012 Elsevier Interactive Patient Education 2016 Elsevier [employer] Pelvic Pain, Female Female pelvic pain can be caused by many different things and start from a variety of places. Pelvic pain refers to pain that is located in the lower half of the abdomen and between your hips. The pain may occur over a short period of time (acute) or may be reoccurring (chronic). The cause of pelvic pain may be related to disorders affecting the female reproductive organs (gynecologic), but it may also be related to the bladder, kidney stones, an intestinal complication, or muscle or skeletal problems. Getting help right away for pelvic pain is important, especially if there has been severe, sharp, or a sudden onset of unusual pain. It is also important to get help right away because some types of pelvic pain can be life threatening.  CAUSES  Below are only some of the causes of pelvic pain. The causes of pelvic pain can be in one of several categories.   Gynecologic.  Pelvic inflammatory disease.  Sexually transmitted infection.  Ovarian cyst or a twisted ovarian ligament (ovarian torsion).  Uterine lining that grows outside the uterus (endometriosis).  Fibroids, cysts, or tumors.  Ovulation.  Pregnancy.  Pregnancy that occurs outside the uterus (ectopic pregnancy).  Miscarriage.  Labor.  Abruption of the placenta or ruptured uterus.  Infection.  Uterine infection (endometritis).  Bladder infection.  Diverticulitis.  Miscarriage related to a uterine infection (septic abortion).  Bladder.  Inflammation of the bladder (cystitis).  Kidney stone(s).  Gastrointestinal.  Constipation.  Diverticulitis.  Neurologic.  Trauma.  Feeling pelvic pain because of mental or emotional causes (psychosomatic).  Cancers of the bowel or  pelvis. EVALUATION  Your caregiver will want to take a careful history of your concerns. This includes recent changes in your health, a careful gynecologic history of your periods (menses), and a sexual history. Obtaining your family history and medical history is also important. Your caregiver may suggest a pelvic exam. A pelvic exam will help identify the location and severity of the pain. It also helps in the evaluation of which organ system may be involved. In order to identify the cause of the pelvic pain and be properly treated, your caregiver may order tests. These tests may include:   A pregnancy test.  Pelvic ultrasonography.  An X-ray exam of the abdomen.  A urinalysis or evaluation of vaginal discharge.  Blood tests. HOME CARE INSTRUCTIONS   Only take over-the-counter or prescription medicines for pain, discomfort, or fever as directed by your caregiver.   Rest as directed by your caregiver.   Eat a balanced diet.   Drink enough fluids to make your urine clear or pale yellow, or as directed.   Avoid sexual intercourse if it causes pain.   Apply warm or cold compresses to the lower abdomen depending on which one helps the pain.   Avoid stressful situations.   Keep a journal of your pelvic pain. Write down when it started, where the pain is located, and if there are things that seem to be associated with the pain, such as food or your menstrual cycle.  Follow up with your caregiver as directed.  SEEK MEDICAL CARE IF:  Your medicine does not help your pain.  You have abnormal vaginal discharge. SEEK IMMEDIATE MEDICAL CARE IF:   You have heavy bleeding from the vagina.   Your pelvic pain increases.   You feel light-headed or faint.   You have chills.   You have pain with urination or blood in your urine.   You have uncontrolled diarrhea or vomiting.   You have a fever or persistent symptoms for more than 3 days.  You have a fever and your  symptoms suddenly get worse.   You are being physically or sexually abused.   This information is not intended to replace advice given to you by your health care provider. Make sure you discuss any questions you have with your health care provider.   Document Released: 12/27/2003 Document Revised: 10/20/2014 Document Reviewed: 05/21/2011 Elsevier Interactive Patient Education [employer].

## 2015-09-26 ENCOUNTER — Ambulatory Visit: Payer: Medicaid Other | Admitting: Obstetrics & Gynecology

## 2016-02-13 NOTE — L&D Delivery Note (Signed)
  Patient is 36 y.o. [id number] [redacted]w[redacted]d admitted in [facility], hx of GBS + with PCN allergy, grand multip. Prior BTL that failed.    Delivery Note At 5:15 AM a viable female was delivered via SVD (Presentation: ROA;  vertex).  APGAR: 9, 9; weight pending.   Placenta status: delivered spontaneously w/ gentle traction, in tact.  Cord: 3vc with the following complications: n/a.  Cord pH: not sent  Anesthesia:  epidural Episiotomy:  none Lacerations:  none Suture Repair: n/a Est. Blood Loss (mL):  <66mL  Mom to postpartum.  Baby to Couplet care / Skin to Skin.  Steve Rattler 10/07/2016, 5:25 AM      Upon arrival patient was complete and pushing. She pushed with good maternal effort to deliver a healthy baby girl. Baby delivered without difficulty, was noted to have good tone and place on maternal abdomen for oral suctioning, drying and stimulation. Delayed cord clamping performed. Placenta delivered intact with 3V cord. Vaginal canal and perineum was inspected and intact; hemostatic. Pitocin was started and uterus massaged until bleeding slowed. Counts of sharps, instruments, and lap pads were all correct.   Steve Rattler, DO PGY-2 8/26/20185:25 AM    I was gloved and present for entire delivery SVD without incident No difficulty with shoulders No lacerations  Seabron Spates, CNM

## 2016-02-16 ENCOUNTER — Inpatient Hospital Stay (HOSPITAL_COMMUNITY): Payer: Medicaid Other

## 2016-02-16 ENCOUNTER — Encounter (HOSPITAL_COMMUNITY): Payer: Self-pay | Admitting: *Deleted

## 2016-02-16 ENCOUNTER — Inpatient Hospital Stay (HOSPITAL_COMMUNITY)
Admission: AD | Admit: 2016-02-16 | Discharge: 2016-02-16 | Disposition: A | Discharge status: Home / Self Care | Payer: Medicaid Other | Source: Ambulatory Visit | Attending: Obstetrics & Gynecology | Admitting: Obstetrics & Gynecology

## 2016-02-16 DIAGNOSIS — O26892 Other specified pregnancy related conditions, second trimester: Secondary | ICD-10-CM | POA: Diagnosis present

## 2016-02-16 DIAGNOSIS — Z9851 Tubal ligation status: Secondary | ICD-10-CM

## 2016-02-16 DIAGNOSIS — R109 Unspecified abdominal pain: Secondary | ICD-10-CM | POA: Diagnosis not present

## 2016-02-16 DIAGNOSIS — Z3491 Encounter for supervision of normal pregnancy, unspecified, first trimester: Secondary | ICD-10-CM

## 2016-02-16 DIAGNOSIS — R1084 Generalized abdominal pain: Secondary | ICD-10-CM

## 2016-02-16 DIAGNOSIS — O26899 Other specified pregnancy related conditions, unspecified trimester: Secondary | ICD-10-CM

## 2016-02-16 DIAGNOSIS — O219 Vomiting of pregnancy, unspecified: Secondary | ICD-10-CM | POA: Diagnosis not present

## 2016-02-16 DIAGNOSIS — Z3A08 8 weeks gestation of pregnancy: Secondary | ICD-10-CM | POA: Insufficient documentation

## 2016-02-16 LAB — URINALYSIS, ROUTINE W REFLEX MICROSCOPIC
BILIRUBIN URINE: NEGATIVE
Glucose, UA: NEGATIVE mg/dL
Hgb urine dipstick: NEGATIVE
Ketones, ur: NEGATIVE mg/dL
LEUKOCYTES UA: NEGATIVE
NITRITE: NEGATIVE
PH: 6 (ref 5.0–8.0)
Protein, ur: NEGATIVE mg/dL
SPECIFIC GRAVITY, URINE: 1.023 (ref 1.005–1.030)

## 2016-02-16 LAB — CBC WITH DIFFERENTIAL/PLATELET
BASOS ABS: 0 10*3/uL (ref 0.0–0.1)
Basophils Relative: 0 %
EOS PCT: 1 %
Eosinophils Absolute: 0.1 10*3/uL (ref 0.0–0.7)
HEMATOCRIT: 37.1 % (ref 36.0–46.0)
Hemoglobin: 13.2 g/dL (ref 12.0–15.0)
LYMPHS ABS: 3.1 10*3/uL (ref 0.7–4.0)
LYMPHS PCT: 30 %
MCH: 30.3 pg (ref 26.0–34.0)
MCHC: 35.6 g/dL (ref 30.0–36.0)
MCV: 85.1 fL (ref 78.0–100.0)
Monocytes Absolute: 0.3 10*3/uL (ref 0.1–1.0)
Monocytes Relative: 3 %
NEUTROS ABS: 6.8 10*3/uL (ref 1.7–7.7)
NEUTROS PCT: 66 %
PLATELETS: 255 10*3/uL (ref 150–400)
RBC: 4.36 MIL/uL (ref 3.87–5.11)
RDW: 12.8 % (ref 11.5–15.5)
WBC: 10.3 10*3/uL (ref 4.0–10.5)

## 2016-02-16 LAB — POCT PREGNANCY, URINE: PREG TEST UR: POSITIVE — AB

## 2016-02-16 LAB — WET PREP, GENITAL
Sperm: NONE SEEN
TRICH WET PREP: NONE SEEN

## 2016-02-16 LAB — HCG, QUANTITATIVE, PREGNANCY: hCG, Beta Chain, Quant, S: 74163 m[IU]/mL — ABNORMAL HIGH (ref <5)

## 2016-02-16 MED ORDER — CONCEPT OB 130-92.4-1 MG PO CAPS: 1.0000 | ORAL_CAPSULE | Freq: Every day | ORAL | 10 refills | Status: DC

## 2016-02-16 MED ORDER — TERCONAZOLE 0.4 % VA CREA: 1.0000 | TOPICAL_CREAM | Freq: Every day | VAGINAL | 0 refills | Status: DC

## 2016-02-16 MED ORDER — METRONIDAZOLE 0.75 % VA GEL: 1.0000 | Freq: Every day | VAGINAL | 1 refills | Status: DC

## 2016-02-16 NOTE — MAU Provider Note (Signed)
Chief Complaint: Possible Pregnancy and Abdominal Pain   None     SUBJECTIVE HPI: Jennifer Dunlap is a 36 y.o. [id number] s/p BTL on 10/12/14 who presents to maternity admissions reporting onset of pregnancy symptoms including nausea, sensitivity to smell, and abdominal cramping 1 week ago. She missed her period this month which is unusual for her.  Patient's last menstrual period was 12/28/2015. She has not taken a pregnancy test at home but came to be evaluated because of the pain and hx of BTL.  She describes the pain as light cramping in the center of her low abdomen that is intermittent and unchanged in intensity since onset. She has not tried any treatments for nausea or for pain.  Nothing seems to make them better or worse. She denies vaginal bleeding, vaginal itching/burning, urinary symptoms, h/a, dizziness, or fever/chills.     HPI  Past Medical History:  Diagnosis Date  . Benign tumor of thyroid gland   . Fibroid   . Pericarditis 2011   Past Surgical History:  Procedure Laterality Date  . LAPAROSCOPIC TUBAL LIGATION Bilateral 10/12/2014   Procedure: LAPAROSCOPIC TUBAL LIGATION;  Surgeon: Woodroe Mode, MD;  Location: [facility];  Service: Gynecology;  Laterality: Bilateral;   Social History   Social History  . Marital status: Single    Spouse name: N/A  . Number of children: N/A  . Years of education: N/A   Occupational History  . Not on file.   Social History Main Topics  . Smoking status: Never Smoker  . Smokeless tobacco: Never Used  . Alcohol use No     Comment: occcasional  . Drug use: No  . Sexual activity: Yes    Birth control/ protection: Surgical   Other Topics Concern  . Not on file   Social History Narrative  . No narrative on file   No current facility-administered medications on file prior to encounter.    No current outpatient prescriptions on file prior to encounter.   Allergies  Allergen Reactions  . Penicillins Anaphylaxis    Has patient had a  PCN reaction causing immediate rash, facial/tongue/throat swelling, SOB or lightheadedness with hypotension: yes Has patient had a PCN reaction causing severe rash involving mucus membranes or skin necrosis: no Has patient had a PCN reaction that required hospitalization unknown Has patient had a PCN reaction occurring within the last 10 years: no If all of the above answers are "NO", then may proceed with Cephalosporin use.     ROS:  Review of Systems  Constitutional: Negative for chills, fatigue and fever.  Respiratory: Negative for shortness of breath.   Cardiovascular: Negative for chest pain.  Gastrointestinal: Positive for abdominal pain and nausea. Negative for vomiting.  Genitourinary: Positive for pelvic pain. Negative for difficulty urinating, dysuria, flank pain, vaginal bleeding, vaginal discharge and vaginal pain.  Neurological: Negative for dizziness and headaches.  Psychiatric/Behavioral: Negative.      I have reviewed patient's Past Medical Hx, Surgical Hx, Family Hx, Social Hx, medications and allergies.   Physical Exam   Patient Vitals for the past 24 hrs:  BP Temp Pulse Resp Height Weight  02/16/16 1739 127/74 - 68 18 - -  02/16/16 1420 149/67 98.1 F (36.7 C) 62 18 5\' 4"  (1.626 m) 173 lb (78.5 kg)   Constitutional: Well-developed, well-nourished female in no acute distress.  Cardiovascular: normal rate Respiratory: normal effort GI: Abd soft, non-tender. Pos BS x 4 MS: Extremities nontender, no edema, normal ROM Neurologic: Alert and oriented  x 4.  GU: Neg CVAT.  PELVIC EXAM: Cervix pink, visually closed, without lesion, scant white creamy discharge, vaginal walls and external genitalia normal Bimanual exam: Cervix 0/long/high, firm, anterior, neg CMT, uterus nontender, nonenlarged, adnexa without tenderness, enlargement, or mass   LAB RESULTS Results for orders placed or performed during the hospital encounter of 02/16/16 (from the past 24 hour(s))   Urinalysis, Routine w reflex microscopic     Status: Abnormal   Collection Time: 02/16/16  2:20 PM  Result Value Ref Range   Color, Urine YELLOW YELLOW   APPearance HAZY (A) CLEAR   Specific Gravity, Urine 1.023 1.005 - 1.030   pH 6.0 5.0 - 8.0   Glucose, UA NEGATIVE NEGATIVE mg/dL   Hgb urine dipstick NEGATIVE NEGATIVE   Bilirubin Urine NEGATIVE NEGATIVE   Ketones, ur NEGATIVE NEGATIVE mg/dL   Protein, ur NEGATIVE NEGATIVE mg/dL   Nitrite NEGATIVE NEGATIVE   Leukocytes, UA NEGATIVE NEGATIVE  Pregnancy, urine POC     Status: Abnormal   Collection Time: 02/16/16  2:32 PM  Result Value Ref Range   Preg Test, Ur POSITIVE (A) NEGATIVE  CBC with Differential/Platelet     Status: None   Collection Time: 02/16/16  2:59 PM  Result Value Ref Range   WBC 10.3 4.0 - 10.5 K/uL   RBC 4.36 3.87 - 5.11 MIL/uL   Hemoglobin 13.2 12.0 - 15.0 g/dL   HCT 37.1 36.0 - 46.0 %   MCV 85.1 78.0 - 100.0 fL   MCH 30.3 26.0 - 34.0 pg   MCHC 35.6 30.0 - 36.0 g/dL   RDW 12.8 11.5 - 15.5 %   Platelets 255 150 - 400 K/uL   Neutrophils Relative % 66 %   Neutro Abs 6.8 1.7 - 7.7 K/uL   Lymphocytes Relative 30 %   Lymphs Abs 3.1 0.7 - 4.0 K/uL   Monocytes Relative 3 %   Monocytes Absolute 0.3 0.1 - 1.0 K/uL   Eosinophils Relative 1 %   Eosinophils Absolute 0.1 0.0 - 0.7 K/uL   Basophils Relative 0 %   Basophils Absolute 0.0 0.0 - 0.1 K/uL  hCG, quantitative, pregnancy     Status: Abnormal   Collection Time: 02/16/16  2:59 PM  Result Value Ref Range   hCG, Beta Chain, Quant, S 74,163 (H) <5 mIU/mL  Wet prep, genital     Status: Abnormal   Collection Time: 02/16/16  3:28 PM  Result Value Ref Range   Yeast Wet Prep HPF POC PRESENT (A) NONE SEEN   Trich, Wet Prep NONE SEEN NONE SEEN   Clue Cells Wet Prep HPF POC PRESENT (A) NONE SEEN   WBC, Wet Prep HPF POC MODERATE (A) NONE SEEN   Sperm NONE SEEN        IMAGING US Ob Comp Less 14 Wks  Result Date: 02/16/2016 CLINICAL DATA:  Abdominal pain  affecting pregnancy EXAM: OBSTETRIC <14 WK [place] AND TRANSVAGINAL OB US TECHNIQUE: Both transabdominal and transvaginal ultrasound examinations were performed for complete evaluation of the gestation as well as the maternal uterus, adnexal regions, and pelvic cul-de-sac. Transvaginal technique was performed to assess early pregnancy. COMPARISON:  09/08/2015 FINDINGS: Intrauterine gestational sac: Single Yolk sac:  Visualized Embryo:  Visualized Cardiac Activity: Visualized Heart Rate: 130  bpm MSD:   mm    w     d CRL:  7.9  mm   6 w   4 d  [place] EDC: 10/07/2016 Subchorionic hemorrhage: Small subchorionic hemorrhage measuring 1.6 x 1.6 x 1.2 cm Maternal uterus/adnexae: No adnexal masses or free fluid. Small posterior lower uterine segment fibroid, 1 cm maximally. IMPRESSION: 6 week 4 day intrauterine pregnancy. Fetal heart rate 130 beats per minute. Small subchorionic hemorrhage. Electronically Signed   By: Rolm Baptise M.D.   On: 02/16/2016 16:17   US Ob Transvaginal  Result Date: 02/16/2016 CLINICAL DATA:  Abdominal pain affecting pregnancy EXAM: OBSTETRIC <14 WK [place] AND TRANSVAGINAL OB US TECHNIQUE: Both transabdominal and transvaginal ultrasound examinations were performed for complete evaluation of the gestation as well as the maternal uterus, adnexal regions, and pelvic cul-de-sac. Transvaginal technique was performed to assess early pregnancy. COMPARISON:  09/08/2015 FINDINGS: Intrauterine gestational sac: Single Yolk sac:  Visualized Embryo:  Visualized Cardiac Activity: Visualized Heart Rate: 130  bpm MSD:   mm    w     d CRL:  7.9  mm   6 w   4 d                  [place] EDC: 10/07/2016 Subchorionic hemorrhage: Small subchorionic hemorrhage measuring 1.6 x 1.6 x 1.2 cm Maternal uterus/adnexae: No adnexal masses or free fluid. Small posterior lower uterine segment fibroid, 1 cm maximally. IMPRESSION: 6 week 4 day intrauterine pregnancy. Fetal heart rate 130 beats per minute. Small subchorionic  hemorrhage. Electronically Signed   By: Rolm Baptise M.D.   On: 02/16/2016 16:17    MAU Management/MDM: Ordered labs and reviewed results.  Today's evaluation was done to rule out ectopic pregnancy which is life-threatening.  Pt has higher risk of ectopic with her history of BTL.  Findings today indicate normally developing IUP.  Rx for PNV and pt given teaching about n/v in pregnancy with suggested home/OTC remedies.  Pt to f/u with [facility] as desired.  Pt stable at time of discharge.  ASSESSMENT 1. Normal IUP (intrauterine pregnancy) on prenatal ultrasound, first trimester   2. Abdominal pain affecting pregnancy, antepartum   3. History of bilateral tubal ligation   4. Nausea and vomiting during pregnancy prior to [redacted] weeks gestation     PLAN Discharge home  Allergies as of 02/16/2016      Reactions   Penicillins Anaphylaxis   Has patient had a PCN reaction causing immediate rash, facial/tongue/throat swelling, SOB or lightheadedness with hypotension: yes Has patient had a PCN reaction causing severe rash involving mucus membranes or skin necrosis: no Has patient had a PCN reaction that required hospitalization unknown Has patient had a PCN reaction occurring within the last 10 years: no If all of the above answers are "NO", then may proceed with Cephalosporin use.      Medication List    STOP taking these medications   ciprofloxacin 500 MG tablet Commonly known as:  CIPRO   traMADol 50 MG tablet Commonly known as:  ULTRAM     TAKE these medications   CONCEPT OB 130-92.4-1 MG Caps Take 1 capsule by mouth daily.   metroNIDAZOLE 0.75 % vaginal gel Commonly known as:  METROGEL Place 1 Applicatorful vaginally at bedtime. Apply one applicatorful to vagina at bedtime for 5 days   terconazole 0.4 % vaginal cream Commonly known as:  TERAZOL 7 Place 1 applicator vaginally at bedtime.      Follow-up [facility]. Schedule an appointment as  soon as possible for a visit.   Specialty:  Obstetrics and Gynecology Contact information: [address]  [place]s [place] Certified Nurse-Midwife 02/16/2016  5:55 PM

## 2016-02-16 NOTE — Discharge Instructions (Signed)
First Trimester of Pregnancy  The first trimester of pregnancy is from week 1 until the end of week 12 (months 1 through 3). A week after a sperm fertilizes an egg, the egg will implant on the wall of the uterus. This embryo will begin to develop into a baby. Genes from you and your partner are forming the baby. The female genes determine whether the baby is a boy or a girl. At 6-8 weeks, the eyes and face are formed, and the heartbeat can be seen on ultrasound. At the end of 12 weeks, all the baby's organs are formed.   Now that you are pregnant, you will want to do everything you can to have a healthy baby. Two of the most important things are to get good prenatal care and to follow your health care provider's instructions. Prenatal care is all the medical care you receive before the baby's birth. This care will help prevent, find, and treat any problems during the pregnancy and childbirth.  BODY CHANGES  Your body goes through many changes during pregnancy. The changes vary from woman to woman.   · You may gain or lose a couple of pounds at first.  · You may feel sick to your stomach (nauseous) and throw up (vomit). If the vomiting is uncontrollable, call your health care provider.  · You may tire easily.  · You may develop headaches that can be relieved by medicines approved by your health care provider.  · You may urinate more often. Painful urination may mean you have a bladder infection.  · You may develop heartburn as a result of your pregnancy.  · You may develop constipation because certain hormones are causing the muscles that push waste through your intestines to slow down.  · You may develop hemorrhoids or swollen, bulging veins (varicose veins).  · Your breasts may begin to grow larger and become tender. Your nipples may stick out more, and the tissue that surrounds them (areola) may become darker.  · Your gums may bleed and may be sensitive to brushing and flossing.   · Dark spots or blotches (chloasma, mask of pregnancy) may develop on your face. This will likely fade after the baby is born.  · Your menstrual periods will stop.  · You may have a loss of appetite.  · You may develop cravings for certain kinds of food.  · You may have changes in your emotions from day to day, such as being excited to be pregnant or being concerned that something may go wrong with the pregnancy and baby.  · You may have more vivid and strange dreams.  · You may have changes in your hair. These can include thickening of your hair, rapid growth, and changes in texture. Some women also have hair loss during or after pregnancy, or hair that feels dry or thin. Your hair will most likely return to normal after your baby is born.  WHAT TO EXPECT AT YOUR PRENATAL VISITS  During a routine prenatal visit:  · You will be weighed to make sure you and the baby are growing normally.  · Your blood pressure will be taken.  · Your abdomen will be measured to track your baby's growth.  · The fetal heartbeat will be listened to starting around week 10 or 12 of your pregnancy.  · Test results from any previous visits will be discussed.  Your health care provider may ask you:  · How you are feeling.  · If you   are feeling the baby move.  · If you have had any abnormal symptoms, such as leaking fluid, bleeding, severe headaches, or abdominal cramping.  · If you are using any tobacco products, including cigarettes, chewing tobacco, and electronic cigarettes.  · If you have any questions.  Other tests that may be performed during your first trimester include:  · Blood tests to find your blood type and to check for the presence of any previous infections. They will also be used to check for low iron levels (anemia) and Rh antibodies. Later in the pregnancy, blood tests for diabetes will be done along with other tests if problems develop.  · Urine tests to check for infections, diabetes, or protein in the urine.   · An ultrasound to confirm the proper growth and development of the baby.  · An amniocentesis to check for possible genetic problems.  · Fetal screens for spina bifida and Down syndrome.  · You may need other tests to make sure you and the baby are doing well.  · HIV (human immunodeficiency virus) testing. Routine prenatal testing includes screening for HIV, unless you choose not to have this test.  HOME CARE INSTRUCTIONS   Medicines  · Follow your health care provider's instructions regarding medicine use. Specific medicines may be either safe or unsafe to take during pregnancy.  · Take your prenatal vitamins as directed.  · If you develop constipation, try taking a stool softener if your health care provider approves.  Diet  · Eat regular, well-balanced meals. Choose a variety of foods, such as meat or vegetable-based protein, fish, milk and low-fat dairy products, vegetables, fruits, and whole grain breads and cereals. Your health care provider will help you determine the amount of weight gain that is right for you.  · Avoid raw meat and uncooked cheese. These carry germs that can cause birth defects in the baby.  · Eating four or five small meals rather than three large meals a day may help relieve nausea and vomiting. If you start to feel nauseous, eating a few soda crackers can be helpful. Drinking liquids between meals instead of during meals also seems to help nausea and vomiting.  · If you develop constipation, eat more high-fiber foods, such as fresh vegetables or fruit and whole grains. Drink enough fluids to keep your urine clear or pale yellow.  Activity and Exercise  · Exercise only as directed by your health care provider. Exercising will help you:    Control your weight.    Stay in shape.    Be prepared for labor and delivery.  · Experiencing pain or cramping in the lower abdomen or low back is a good sign that you should stop exercising. Check with your health care provider  before continuing normal exercises.  · Try to avoid standing for long periods of time. Move your legs often if you must stand in one place for a long time.  · Avoid heavy lifting.  · Wear low-heeled shoes, and practice good posture.  · You may continue to have sex unless your health care provider directs you otherwise.  Relief of Pain or Discomfort  · Wear a good support bra for breast tenderness.    · Take warm sitz baths to soothe any pain or discomfort caused by hemorrhoids. Use hemorrhoid cream if your health care provider approves.    · Rest with your legs elevated if you have leg cramps or low back pain.  · If you develop varicose veins in your   visits as directed by your health care provider. Safety   Wear your seat belt at all times when driving.  Make a list of emergency phone numbers, including numbers for family, friends, the hospital, and police and fire departments. General Tips   Ask your health care provider for a referral to a local prenatal education class. Begin classes no later than at the beginning of month 6 of your pregnancy.  Ask for help if you have counseling or nutritional needs during pregnancy. Your health care provider can offer advice or refer you to specialists for help with various needs.  Do not use hot tubs, steam rooms, or saunas.  Do not douche or use tampons or scented sanitary pads.  Do not cross your legs for long periods of time.  Avoid cat litter boxes and soil used by cats. These carry germs that can cause birth defects in the baby and possibly loss of the fetus by miscarriage or stillbirth.  Avoid all smoking, herbs, alcohol, and medicines not  prescribed by your health care provider. Chemicals in these affect the formation and growth of the baby.  Do not use any tobacco products, including cigarettes, chewing tobacco, and electronic cigarettes. If you need help quitting, ask your health care provider. You may receive counseling support and other resources to help you quit.  Schedule a dentist appointment. At home, brush your teeth with a soft toothbrush and be gentle when you floss. SEEK MEDICAL CARE IF:   You have dizziness.  You have mild pelvic cramps, pelvic pressure, or nagging pain in the abdominal area.  You have persistent nausea, vomiting, or diarrhea.  You have a bad smelling vaginal discharge.  You have pain with urination.  You notice increased swelling in your face, hands, legs, or ankles. SEEK IMMEDIATE MEDICAL CARE IF:   You have a fever.  You are leaking fluid from your vagina.  You have spotting or bleeding from your vagina.  You have severe abdominal cramping or pain.  You have rapid weight gain or loss.  You vomit blood or material that looks like coffee grounds.  You are exposed to [place] measles and have never had them.  You are exposed to fifth disease or chickenpox.  You develop a severe headache.  You have shortness of breath.  You have any kind of trauma, such as from a fall or a car accident. This information is not intended to replace advice given to you by your health care provider. Make sure you discuss any questions you have with your health care provider. Document Released: 01/23/2001 Document Revised: 02/19/2014 Document Reviewed: 12/09/2012 Elsevier Interactive Patient Education  2017 [employer]   Morning Sickness Morning sickness is when you feel sick to your stomach (nauseous) during pregnancy. This nauseous feeling may or may not come with vomiting. It often occurs in the morning but can be a problem any time of day. Morning sickness is most common during the first  trimester, but it may continue throughout pregnancy. While morning sickness is unpleasant, it is usually harmless unless you develop severe and continual vomiting (hyperemesis gravidarum). This condition requires more intense treatment. What are the causes? The cause of morning sickness is not completely known but seems to be related to normal hormonal changes that occur in pregnancy. What increases the risk? You are at greater risk if you:  Experienced nausea or vomiting before your pregnancy.  Had morning sickness during a previous pregnancy.  Are pregnant with more than one baby, such  as twins. How is this treated? Do not use any medicines (prescription, over-the-counter, or herbal) for morning sickness without first talking to your health care provider. Your health care provider may prescribe or recommend:  Vitamin B6 supplements.  Anti-nausea medicines.  The herbal medicine ginger. Follow these instructions at home:  Only take over-the-counter or prescription medicines as directed by your health care provider.  Taking multivitamins before getting pregnant can prevent or decrease the severity of morning sickness in most women.  Eat a piece of dry toast or unsalted crackers before getting out of bed in the morning.  Eat five or six small meals a day.  Eat dry and bland foods (rice, baked potato). Foods high in carbohydrates are often helpful.  Do not drink liquids with your meals. Drink liquids between meals.  Avoid greasy, fatty, and spicy foods.  Get someone to cook for you if the smell of any food causes nausea and vomiting.  If you feel nauseous after taking prenatal vitamins, take the vitamins at night or with a snack.  Snack on protein foods (nuts, yogurt, cheese) between meals if you are hungry.  Eat unsweetened gelatins for desserts.  Wearing an acupressure wristband (worn for sea sickness) may be helpful.  Acupuncture may be helpful.  Do not smoke.  Get a  humidifier to keep the air in your house free of odors.  Get plenty of fresh air. Contact a health care provider if:  Your home remedies are not working, and you need medicine.  You feel dizzy or lightheaded.  You are losing weight. Get help right away if:  You have persistent and uncontrolled nausea and vomiting.  You pass out (faint). This information is not intended to replace advice given to you by your health care provider. Make sure you discuss any questions you have with your health care provider. Document Released: 03/22/2006 Document Revised: 07/07/2015 Document Reviewed: 07/16/2012 Elsevier Interactive Patient Education  2017 [employer].

## 2016-02-16 NOTE — MAU Note (Addendum)
Pt presents to [facility]U with complaints of lower abdominal cramping for a week. States she is having nausea and thinks she may be pregnant. BTL 08/16.

## 2016-02-17 LAB — HIV ANTIBODY (ROUTINE TESTING W REFLEX): HIV SCREEN 4TH GENERATION: NONREACTIVE

## 2016-02-20 LAB — GC/CHLAMYDIA PROBE AMP (~~LOC~~) NOT AT ARMC
CHLAMYDIA, DNA PROBE: NEGATIVE
Neisseria Gonorrhea: NEGATIVE

## 2016-03-02 ENCOUNTER — Encounter (HOSPITAL_COMMUNITY): Payer: Self-pay | Admitting: *Deleted

## 2016-03-02 ENCOUNTER — Inpatient Hospital Stay (HOSPITAL_COMMUNITY): Payer: Medicaid Other

## 2016-03-02 ENCOUNTER — Inpatient Hospital Stay (HOSPITAL_COMMUNITY)
Admission: AD | Admit: 2016-03-02 | Discharge: 2016-03-02 | Disposition: A | Discharge status: Home / Self Care | Payer: Medicaid Other | Source: Ambulatory Visit | Attending: Obstetrics and Gynecology | Admitting: Obstetrics and Gynecology

## 2016-03-02 DIAGNOSIS — O26892 Other specified pregnancy related conditions, second trimester: Secondary | ICD-10-CM | POA: Insufficient documentation

## 2016-03-02 DIAGNOSIS — Z3A09 9 weeks gestation of pregnancy: Secondary | ICD-10-CM | POA: Insufficient documentation

## 2016-03-02 DIAGNOSIS — R109 Unspecified abdominal pain: Secondary | ICD-10-CM | POA: Insufficient documentation

## 2016-03-02 DIAGNOSIS — O418X10 Other specified disorders of amniotic fluid and membranes, first trimester, not applicable or unspecified: Secondary | ICD-10-CM

## 2016-03-02 DIAGNOSIS — O208 Other hemorrhage in early pregnancy: Secondary | ICD-10-CM | POA: Diagnosis present

## 2016-03-02 DIAGNOSIS — O468X1 Other antepartum hemorrhage, first trimester: Secondary | ICD-10-CM

## 2016-03-02 DIAGNOSIS — O209 Hemorrhage in early pregnancy, unspecified: Secondary | ICD-10-CM

## 2016-03-02 LAB — URINALYSIS, ROUTINE W REFLEX MICROSCOPIC
Bilirubin Urine: NEGATIVE
Glucose, UA: NEGATIVE mg/dL
KETONES UR: NEGATIVE mg/dL
LEUKOCYTES UA: NEGATIVE
Nitrite: NEGATIVE
PH: 8 (ref 5.0–8.0)
Protein, ur: NEGATIVE mg/dL
Specific Gravity, Urine: 1.008 (ref 1.005–1.030)

## 2016-03-02 LAB — WET PREP, GENITAL
Sperm: NONE SEEN
TRICH WET PREP: NONE SEEN
Yeast Wet Prep HPF POC: NONE SEEN

## 2016-03-02 NOTE — MAU Provider Note (Signed)
History     CSN: [id number]  Arrival date and time: 03/02/16 1616   First Provider Initiated Contact with Patient 03/02/16 1759      Chief Complaint  Patient presents with  . pinkish fluid d/c  . Abdominal Cramping   HPI Jennifer Dunlap is a 36 y.o. [id number] at [redacted]w[redacted]d who presents with vaginal bleeding and abdominal cramping. Bleeding started 30 minutes PTA. Initially was thin pink tinged fluid that turned to dark red blood. Has not saturated pads. No clots. Abdominal cramping started one she arrived to MAU. Rate [place] 5/10. Has not treated. Nothing makes better or worse.  Denies n/v/d, constipation, dysuria, vaginal discharge, or recent intercourse.   OB History    Gravida Para Term Preterm AB Living   10 7 7  0 2 7   SAB TAB Ectopic Multiple Live Births   0 2 0 0 7      Past Medical History:  Diagnosis Date  . Benign tumor of thyroid gland   . Fibroid   . Pericarditis 2011    Past Surgical History:  Procedure Laterality Date  . LAPAROSCOPIC TUBAL LIGATION Bilateral 10/12/2014   Procedure: LAPAROSCOPIC TUBAL LIGATION;  Surgeon: Woodroe Mode, MD;  Location: [facility];  Service: Gynecology;  Laterality: Bilateral;    Family History  Problem Relation Age of Onset  . Heart disease Father   . Heart attack Father   . Stroke Father   . Cancer Sister 25    ovarian  . Hypertension Mother   . Brain cancer Maternal Grandmother   . Heart attack Paternal Grandmother     Social History  Substance Use Topics  . Smoking status: Never Smoker  . Smokeless tobacco: Never Used  . Alcohol use No     Comment: occcasional    Allergies:  Allergies  Allergen Reactions  . Penicillins Anaphylaxis    Has patient had a PCN reaction causing immediate rash, facial/tongue/throat swelling, SOB or lightheadedness with hypotension: yes Has patient had a PCN reaction causing severe rash involving mucus membranes or skin necrosis: no Has patient had a PCN reaction that required hospitalization  unknown Has patient had a PCN reaction occurring within the last 10 years: no If all of the above answers are "NO", then may proceed with Cephalosporin use.     Prescriptions Prior to Admission  Medication Sig Dispense Refill Last Dose  . Prenat w/o A Vit-FeFum-FePo-FA (CONCEPT OB) 130-92.4-1 MG CAPS Take 1 capsule by mouth daily. 30 capsule 10 Past Week at Unknown time  . metroNIDAZOLE (METROGEL) 0.75 % vaginal gel Place 1 Applicatorful vaginally at bedtime. Apply one applicatorful to vagina at bedtime for 5 days (Patient not taking: Reported on 03/02/2016) 70 g 1 Completed Course at Unknown time  . terconazole (TERAZOL 7) 0.4 % vaginal cream Place 1 applicator vaginally at bedtime. (Patient not taking: Reported on 03/02/2016) 45 g 0 Completed Course at Unknown time    Review of Systems  Constitutional: Negative.   Gastrointestinal: Positive for abdominal pain. Negative for constipation, diarrhea, nausea and vomiting.  Genitourinary: Positive for vaginal bleeding. Negative for dysuria and vaginal discharge.   Physical Exam   Blood pressure (!) 106/53, pulse 61, temperature 97.7 F (36.5 C), temperature source Oral, resp. rate 18, weight 168 lb 12 oz (76.5 kg), last menstrual period 12/28/2015, currently breastfeeding.  Physical Exam  Nursing note and vitals reviewed. Constitutional: She is oriented to person, place, and time. She appears well-developed and well-nourished. No distress.  HENT:  Head:  Normocephalic and atraumatic.  Eyes: Conjunctivae are normal. Right eye exhibits no discharge. Left eye exhibits no discharge. No scleral icterus.  Neck: Normal range of motion.  Respiratory: Effort normal. No respiratory distress.  GI: Soft. She exhibits no distension. There is no tenderness. There is no rebound.  Genitourinary: Uterus normal. Cervix exhibits no motion tenderness and no friability. There is bleeding (small amount of dark red blood) in the vagina.  Genitourinary Comments:  Cervix closed  Neurological: She is alert and oriented to person, place, and time.  Skin: Skin is warm and dry. She is not diaphoretic.  Psychiatric: She has a normal mood and affect. Her behavior is normal. Judgment and thought content normal.    MAU Course  Procedures Results for orders placed or performed during the hospital encounter of 03/02/16 (from the past 24 hour(s))  Urinalysis, Routine w reflex microscopic     Status: Abnormal   Collection Time: 03/02/16  4:30 PM  Result Value Ref Range   Color, Urine YELLOW YELLOW   APPearance HAZY (A) CLEAR   Specific Gravity, Urine 1.008 1.005 - 1.030   pH 8.0 5.0 - 8.0   Glucose, UA NEGATIVE NEGATIVE mg/dL   Hgb urine dipstick MODERATE (A) NEGATIVE   Bilirubin Urine NEGATIVE NEGATIVE   Ketones, ur NEGATIVE NEGATIVE mg/dL   Protein, ur NEGATIVE NEGATIVE mg/dL   Nitrite NEGATIVE NEGATIVE   Leukocytes, UA NEGATIVE NEGATIVE   RBC / HPF 0-5 0 - 5 RBC/hpf   WBC, UA 0-5 0 - 5 WBC/hpf   Bacteria, UA RARE (A) NONE SEEN   Squamous Epithelial / LPF 0-5 (A) NONE SEEN  Wet prep, genital     Status: Abnormal   Collection Time: 03/02/16  6:14 PM  Result Value Ref Range   Yeast Wet Prep HPF POC NONE SEEN NONE SEEN   Trich, Wet Prep NONE SEEN NONE SEEN   Clue Cells Wet Prep HPF POC PRESENT (A) NONE SEEN   WBC, Wet Prep HPF POC FEW (A) NONE SEEN   Sperm NONE SEEN    US Ob Transvaginal  Result Date: 03/02/2016 CLINICAL DATA:  Vaginal bleeding EXAM: OBSTETRIC <14 WK ULTRASOUND TECHNIQUE: Transabdominal ultrasound was performed for evaluation of the gestation as well as the maternal uterus and adnexal regions. COMPARISON:  None. FINDINGS: Intrauterine gestational sac: Single Yolk sac:  Visualized. Embryo:  Visualized. Cardiac Activity: Visualized. Heart Rate: 164 bpm CRL:   23  mm   8 w 6d                  [place] EDC: 10/06/2016 Subchorionic hemorrhage:  Small-moderate subchorionic hemorrhage. Maternal uterus/adnexae: Right ovarian corpus luteum cyst of  pregnancy. No adnexal masses. No significant pelvic free fluid. IMPRESSION: 1. Single live intrauterine pregnancy as detailed above. 2. Small-moderate subchorionic hemorrhage. Attention on follow-up examination is recommended. Electronically Signed   By: Kathreen Devoid   On: 03/02/2016 19:14    MDM O positive Ultrasound shows SIUP with cardiac activity and [facility]  Assessment and Plan  A: 1. Subchorionic hematoma in first trimester, single or unspecified fetus   2. Vaginal bleeding in pregnancy, first trimester    P: Discharge home Pelvic rest Discussed reasons to return to MAU Start prenatal care  Jorje Guild 03/02/2016, 5:58 PM

## 2016-03-02 NOTE — Discharge Instructions (Signed)
Subchorionic Hematoma A subchorionic hematoma is a gathering of blood between the outer wall of the placenta and the inner wall of the womb (uterus). The placenta is the organ that connects the fetus to the wall of the uterus. The placenta performs the feeding, breathing (oxygen to the fetus), and waste removal (excretory work) of the fetus.  Subchorionic hematoma is the most common abnormality found on a result from ultrasonography done during the first trimester or early second trimester of pregnancy. If there has been little or no vaginal bleeding, early small hematomas usually shrink on their own and do not affect your baby or pregnancy. The blood is gradually absorbed over 1-2 weeks. When bleeding starts later in pregnancy or the hematoma is larger or occurs in an older pregnant woman, the outcome may not be as good. Larger hematomas may get bigger, which increases the chances for miscarriage. Subchorionic hematoma also increases the risk of premature detachment of the placenta from the uterus, preterm (premature) labor, and stillbirth. HOME CARE INSTRUCTIONS  Stay on bed rest if your health care provider recommends this. Although bed rest will not prevent more bleeding or prevent a miscarriage, your health care provider may recommend bed rest until you are advised otherwise.  Avoid heavy lifting (more than 10 lb [4.5 kg]), exercise, sexual intercourse, or douching as directed by your health care provider.  Keep track of the number of pads you use each day and how soaked (saturated) they are. Write down this information.  Do not use tampons.  Keep all follow-up appointments as directed by your health care provider. Your health care provider may ask you to have follow-up blood tests or ultrasound tests or both. SEEK IMMEDIATE MEDICAL CARE IF:  You have severe cramps in your stomach, back, abdomen, or pelvis.  You have a fever.  You pass large clots or tissue. Save any tissue for your health  care provider to look at.  Your bleeding increases or you become lightheaded, feel weak, or have fainting episodes. This information is not intended to replace advice given to you by your health care provider. Make sure you discuss any questions you have with your health care provider. Document Released: 05/16/2006 Document Revised: 02/19/2014 Document Reviewed: 08/28/2012 Elsevier Interactive Patient Education  2017 [employer].

## 2016-03-02 NOTE — MAU Note (Signed)
Started leaking pinkish fluid about 58min ago. Cramping in lower abd, just started

## 2016-03-05 LAB — GC/CHLAMYDIA PROBE AMP (~~LOC~~) NOT AT ARMC
CHLAMYDIA, DNA PROBE: NEGATIVE
NEISSERIA GONORRHEA: NEGATIVE

## 2016-03-22 ENCOUNTER — Encounter: Payer: Self-pay | Admitting: Obstetrics and Gynecology

## 2016-03-22 ENCOUNTER — Telehealth: Payer: Self-pay | Admitting: *Deleted

## 2016-03-22 ENCOUNTER — Ambulatory Visit (INDEPENDENT_AMBULATORY_CARE_PROVIDER_SITE_OTHER): Payer: Medicaid Other | Admitting: Obstetrics and Gynecology

## 2016-03-22 VITALS — BP 123/66 | HR 66 | Wt 166.8 lb

## 2016-03-22 DIAGNOSIS — O0990 Supervision of high risk pregnancy, unspecified, unspecified trimester: Secondary | ICD-10-CM

## 2016-03-22 DIAGNOSIS — Z8679 Personal history of other diseases of the circulatory system: Secondary | ICD-10-CM | POA: Diagnosis not present

## 2016-03-22 DIAGNOSIS — O0991 Supervision of high risk pregnancy, unspecified, first trimester: Secondary | ICD-10-CM

## 2016-03-22 DIAGNOSIS — Z9851 Tubal ligation status: Secondary | ICD-10-CM

## 2016-03-22 DIAGNOSIS — Z113 Encounter for screening for infections with a predominantly sexual mode of transmission: Secondary | ICD-10-CM | POA: Diagnosis not present

## 2016-03-22 DIAGNOSIS — Z8759 Personal history of other complications of pregnancy, childbirth and the puerperium: Secondary | ICD-10-CM | POA: Diagnosis not present

## 2016-03-22 DIAGNOSIS — Z1151 Encounter for screening for human papillomavirus (HPV): Secondary | ICD-10-CM | POA: Diagnosis not present

## 2016-03-22 DIAGNOSIS — Z124 Encounter for screening for malignant neoplasm of cervix: Secondary | ICD-10-CM

## 2016-03-22 LAB — POCT URINALYSIS DIP (DEVICE)
Glucose, UA: NEGATIVE mg/dL
LEUKOCYTES UA: NEGATIVE
NITRITE: NEGATIVE
Protein, ur: NEGATIVE mg/dL
Specific Gravity, Urine: >=1.03 (ref 1.005–1.030)
Urobilinogen, UA: 2 mg/dL — ABNORMAL HIGH (ref 0.0–1.0)
pH: 5.5 (ref 5.0–8.0)

## 2016-03-22 LAB — TSH: TSH: 0.11 m[IU]/L — AB

## 2016-03-22 MED ORDER — DOXYLAMINE-PYRIDOXINE 10-10 MG PO TBEC: DELAYED_RELEASE_TABLET | ORAL | 6 refills | Status: DC

## 2016-03-22 NOTE — Progress Notes (Signed)
Subjective:    Jennifer Dunlap is a [id number] [redacted]w[redacted]d being seen today for her first obstetrical visit.  Her obstetrical history is significant for history of pericarditis in 2011, has a normal echo in 2014; history of thyromegaly and reports benign work up with endocrinologust and tubal ligation in 2016. Patient does intend to breast feed. Pregnancy history fully reviewed.  Patient reports nausea.  Vitals:   03/22/16 1418  BP: 123/66  Pulse: 66    HISTORY: OB History  Gravida Para Term Preterm AB Living  10 7 7  0 2 7  SAB TAB Ectopic Multiple Live Births  0 2 0 0 7    # Outcome Date GA Lbr Len/2nd Weight Sex Delivery Anes PTL Lv  10 Current           9 Term 08/13/14 [redacted]w[redacted]d 03:07 / 00:14 8 lb 12.4 oz (3.98 kg) F Vag-Spont EPI  LIV  8 Term 07/15/12 [redacted]w[redacted]d 05:01 / 00:13 8 lb 1.1 oz (3.66 kg) F Vag-Spont EPI  LIV  7 Term 07/07/09 [redacted]w[redacted]d  6 lb 13 oz (3.09 kg) F Vag-Spont EPI  LIV  6 Term 05/17/08 [redacted]w[redacted]d  6 lb 14 oz (3.118 kg) F Vag-Spont EPI  LIV  5 Term 06/03/07 [redacted]w[redacted]d  6 lb 6 oz (2.892 kg) F Vag-Spont EPI  LIV  4 Term 06/13/01 [redacted]w[redacted]d  7 lb 12 oz (3.515 kg) F Vag-Spont EPI  LIV  3 Term 04/05/98 [redacted]w[redacted]d  5 lb 14 oz (2.665 kg) M Vag-Spont None  LIV  2 TAB           1 TAB              Past Medical History:  Diagnosis Date  . Benign tumor of thyroid gland   . Fibroid   . Pericarditis 2011   Past Surgical History:  Procedure Laterality Date  . LAPAROSCOPIC TUBAL LIGATION Bilateral 10/12/2014   Procedure: LAPAROSCOPIC TUBAL LIGATION;  Surgeon: Woodroe Mode, MD;  Location: [facility];  Service: Gynecology;  Laterality: Bilateral;   Family History  Problem Relation Age of Onset  . Heart disease Father   . Heart attack Father   . Stroke Father   . Cancer Sister 68    ovarian  . Hypertension Mother   . Brain cancer Maternal Grandmother   . Heart attack Paternal Grandmother      Exam    Uterus:     Pelvic Exam:    Perineum: No Hemorrhoids, Normal Perineum   Vulva: normal   Vagina:   normal mucosa, normal discharge   pH:    Cervix: multiparous appearance   Adnexa: normal adnexa and no mass, fullness, tenderness   Bony Pelvis: gynecoid  System: Breast:  normal appearance, no masses or tenderness   Skin: normal coloration and turgor, no rashes    Neurologic: oriented, no focal deficits   Extremities: normal strength, tone, and muscle mass   HEENT extra ocular movement intact. Enlarged thyroid, non tender   Mouth/Teeth mucous membranes moist, pharynx normal without lesions and dental hygiene good   Neck supple and no masses   Cardiovascular: regular rate and rhythm   Respiratory:  chest clear, no wheezing, crepitations, rhonchi, normal symmetric air entry   Abdomen: soft, non-tender; bowel sounds normal; no masses,  no organomegaly   Urinary:       Assessment:    Pregnancy: [id number] Patient Active Problem List   Diagnosis Date Noted  . Supervision of high risk pregnancy, antepartum 03/22/2016  .  History of bilateral tubal ligation 02/16/2016  . 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 History of maternal pericarditis 02/20/2012        Plan:     Initial labs drawn. Prenatal vitamins. Problem list reviewed and updated. Genetic Screening discussed First Screen: ordered.  Ultrasound discussed; fetal survey: requested. Patient will be scheduled for echo TSH ordered Patient desires salpingectomy  Follow up in 4 weeks. 50% of 30 min visit spent on counseling and coordination of care.     Benjerman Molinelli 03/22/2016

## 2016-03-22 NOTE — Telephone Encounter (Signed)
Pt needs to be set up for adult echo. Pt is self pay so this needs to be done at [facility]. I called [phone] to set up but had to leave voicemail with patient info and procedure needed. Asked them to call us back with appointment. Pt advised that we will be calling with appointment info once we receive it.

## 2016-03-22 NOTE — Patient Instructions (Signed)
Second Trimester of Pregnancy The second trimester is from week 13 through week 28 (months 4 through 6). The second trimester is often a time when you feel your best. Your body has also adjusted to being pregnant, and you begin to feel better physically. Usually, morning sickness has lessened or quit completely, you may have more energy, and you may have an increase in appetite. The second trimester is also a time when the fetus is growing rapidly. At the end of the sixth month, the fetus is about 9 inches long and weighs about 1 pounds. You will likely begin to feel the baby move (quickening) between 18 and 20 weeks of the pregnancy. Body changes during your second trimester Your body continues to go through many changes during your second trimester. The changes vary from woman to woman.  Your weight will continue to increase. You will notice your lower abdomen bulging out.  You may begin to get stretch marks on your hips, abdomen, and breasts.  You may develop headaches that can be relieved by medicines. The medicines should be approved by your health care provider.  You may urinate more often because the fetus is pressing on your bladder.  You may develop or continue to have heartburn as a result of your pregnancy.  You may develop constipation because certain hormones are causing the muscles that push waste through your intestines to slow down.  You may develop hemorrhoids or swollen, bulging veins (varicose veins).  You may have back pain. This is caused by:  Weight gain.  Pregnancy hormones that are relaxing the joints in your pelvis.  A shift in weight and the muscles that support your balance.  Your breasts will continue to grow and they will continue to become tender.  Your gums may bleed and may be sensitive to brushing and flossing.  Dark spots or blotches (chloasma, mask of pregnancy) may develop on your face. This will likely fade after the baby is born.  A dark line  from your belly button to the pubic area (linea nigra) may appear. This will likely fade after the baby is born.  You may have changes in your hair. These can include thickening of your hair, rapid growth, and changes in texture. Some women also have hair loss during or after pregnancy, or hair that feels dry or thin. Your hair will most likely return to normal after your baby is born. What to expect at prenatal visits During a routine prenatal visit:  You will be weighed to make sure you and the fetus are growing normally.  Your blood pressure will be taken.  Your abdomen will be measured to track your baby's growth.  The fetal heartbeat will be listened to.  Any test results from the previous visit will be discussed. Your health care provider may ask you:  How you are feeling.  If you are feeling the baby move.  If you have had any abnormal symptoms, such as leaking fluid, bleeding, severe headaches, or abdominal cramping.  If you are using any tobacco products, including cigarettes, chewing tobacco, and electronic cigarettes.  If you have any questions. Other tests that may be performed during your second trimester include:  Blood tests that check for:  Low iron levels (anemia).  Gestational diabetes (between 24 and 28 weeks).  Rh antibodies. This is to check for a protein on red blood cells (Rh factor).  Urine tests to check for infections, diabetes, or protein in the urine.  An ultrasound to  confirm the proper growth and development of the baby.  An amniocentesis to check for possible genetic problems.  Fetal screens for spina bifida and Down syndrome.  HIV (human immunodeficiency virus) testing. Routine prenatal testing includes screening for HIV, unless you choose not to have this test. Follow these instructions at home: Eating and drinking  Continue to eat regular, healthy meals.  Avoid raw meat, uncooked cheese, cat litter boxes, and soil used by cats. These  carry germs that can cause birth defects in the baby.  Take your prenatal vitamins.  Take 1500-2000 mg of calcium daily starting at the 20th week of pregnancy until you deliver your baby.  If you develop constipation:  Take over-the-counter or prescription medicines.  Drink enough fluid to keep your urine clear or pale yellow.  Eat foods that are high in fiber, such as fresh fruits and vegetables, whole grains, and beans.  Limit foods that are high in fat and processed sugars, such as fried and sweet foods. Activity  Exercise only as directed by your health care provider. Experiencing uterine cramps is a good sign to stop exercising.  Avoid heavy lifting, wear low heel shoes, and practice good posture.  Wear your seat belt at all times when driving.  Rest with your legs elevated if you have leg cramps or low back pain.  Wear a good support bra for breast tenderness.  Do not use hot tubs, steam rooms, or saunas. Lifestyle  Avoid all smoking, herbs, alcohol, and unprescribed drugs. These chemicals affect the formation and growth of the baby.  Do not use any products that contain nicotine or tobacco, such as cigarettes and e-cigarettes. If you need help quitting, ask your health care provider.  A sexual relationship may be continued unless your health care provider directs you otherwise. General instructions  Follow your health care provider's instructions regarding medicine use. There are medicines that are either safe or unsafe to take during pregnancy.  Take warm sitz baths to soothe any pain or discomfort caused by hemorrhoids. Use hemorrhoid cream if your health care provider approves.  If you develop varicose veins, wear support hose. Elevate your feet for 15 minutes, 3-4 times a day. Limit salt in your diet.  Visit your dentist if you have not gone yet during your pregnancy. Use a soft toothbrush to brush your teeth and be gentle when you floss.  Keep all follow-up  prenatal visits as told by your health care provider. This is important. Contact a health care provider if:  You have dizziness.  You have mild pelvic cramps, pelvic pressure, or nagging pain in the abdominal area.  You have persistent nausea, vomiting, or diarrhea.  You have a bad smelling vaginal discharge.  You have pain with urination. Get help right away if:  You have a fever.  You are leaking fluid from your vagina.  You have spotting or bleeding from your vagina.  You have severe abdominal cramping or pain.  You have rapid weight gain or weight loss.  You have shortness of breath with chest pain.  You notice sudden or extreme swelling of your face, hands, ankles, feet, or legs.  You have not felt your baby move in over an hour.  You have severe headaches that do not go away with medicine.  You have vision changes. Summary  The second trimester is from week 13 through week 28 (months 4 through 6). It is also a time when the fetus is growing rapidly.  Your body goes  through many changes during pregnancy. The changes vary from woman to woman.  Avoid all smoking, herbs, alcohol, and unprescribed drugs. These chemicals affect the formation and growth your baby.  Do not use any tobacco products, such as cigarettes, chewing tobacco, and e-cigarettes. If you need help quitting, ask your health care provider.  Contact your health care provider if you have any questions. Keep all prenatal visits as told by your health care provider. This is important. This information is not intended to replace advice given to you by your health care provider. Make sure you discuss any questions you have with your health care provider. Document Released: 01/23/2001 Document Revised: 07/07/2015 Document Reviewed: 04/01/2012 Elsevier Interactive Patient Education  2017 [employer].  Contraception Choices Contraception (birth control) is the use of any methods or devices to prevent  pregnancy. Below are some methods to help avoid pregnancy. Hormonal methods  Contraceptive implant. This is a thin, plastic tube containing progesterone hormone. It does not contain estrogen hormone. Your health care provider inserts the tube in the inner part of the upper arm. The tube can remain in place for up to 3 years. After 3 years, the implant must be removed. The implant prevents the ovaries from releasing an egg (ovulation), thickens the cervical mucus to prevent sperm from entering the uterus, and thins the lining of the inside of the uterus.  Progesterone-only injections. These injections are given every 3 months by your health care provider to prevent pregnancy. This synthetic progesterone hormone stops the ovaries from releasing eggs. It also thickens cervical mucus and changes the uterine lining. This makes it harder for sperm to survive in the uterus.  Birth control pills. These pills contain estrogen and progesterone hormone. They work by preventing the ovaries from releasing eggs (ovulation). They also cause the cervical mucus to thicken, preventing the sperm from entering the uterus. Birth control pills are prescribed by a health care provider.Birth control pills can also be used to treat heavy periods.  Minipill. This type of birth control pill contains only the progesterone hormone. They are taken every day of each month and must be prescribed by your health care provider.  Birth control patch. The patch contains hormones similar to those in birth control pills. It must be changed once a week and is prescribed by a health care provider.  Vaginal ring. The ring contains hormones similar to those in birth control pills. It is left in the vagina for 3 weeks, removed for 1 week, and then a new one is put back in place. The patient must be comfortable inserting and removing the ring from the vagina.A health care provider's prescription is necessary.  Emergency contraception.  Emergency contraceptives prevent pregnancy after unprotected sexual intercourse. This pill can be taken right after sex or up to 5 days after unprotected sex. It is most effective the sooner you take the pills after having sexual intercourse. Most emergency contraceptive pills are available without a prescription. Check with your pharmacist. Do not use emergency contraception as your only form of birth control. Barrier methods  Female condom. This is a thin sheath (latex or rubber) that is worn over the penis during sexual intercourse. It can be used with spermicide to increase effectiveness.  Female condom. This is a soft, loose-fitting sheath that is put into the vagina before sexual intercourse.  Diaphragm. This is a soft, latex, dome-shaped barrier that must be fitted by a health care provider. It is inserted into the vagina, along with a  spermicidal jelly. It is inserted before intercourse. The diaphragm should be left in the vagina for 6 to 8 hours after intercourse.  Cervical cap. This is a round, soft, latex or plastic cup that fits over the cervix and must be fitted by a health care provider. The cap can be left in place for up to 48 hours after intercourse.  Sponge. This is a soft, circular piece of polyurethane foam. The sponge has spermicide in it. It is inserted into the vagina after wetting it and before sexual intercourse.  Spermicides. These are chemicals that kill or block sperm from entering the cervix and uterus. They come in the form of creams, jellies, suppositories, foam, or tablets. They do not require a prescription. They are inserted into the vagina with an applicator before having sexual intercourse. The process must be repeated every time you have sexual intercourse. Intrauterine contraception  Intrauterine device (IUD). This is a T-shaped device that is put in a woman's uterus during a menstrual period to prevent pregnancy. There are 2 types:  Copper IUD. This type of IUD  is wrapped in copper wire and is placed inside the uterus. Copper makes the uterus and fallopian tubes produce a fluid that kills sperm. It can stay in place for 10 years.  Hormone IUD. This type of IUD contains the hormone progestin (synthetic progesterone). The hormone thickens the cervical mucus and prevents sperm from entering the uterus, and it also thins the uterine lining to prevent implantation of a fertilized egg. The hormone can weaken or kill the sperm that get into the uterus. It can stay in place for 3-5 years, depending on which type of IUD is used. Permanent methods of contraception  Female tubal ligation. This is when the woman's fallopian tubes are surgically sealed, tied, or blocked to prevent the egg from traveling to the uterus.  Hysteroscopic sterilization. This involves placing a small coil or insert into each fallopian tube. Your doctor uses a technique called hysteroscopy to do the procedure. The device causes scar tissue to form. This results in permanent blockage of the fallopian tubes, so the sperm cannot fertilize the egg. It takes about 3 months after the procedure for the tubes to become blocked. You must use another form of birth control for these 3 months.  Female sterilization. This is when the female has the tubes that carry sperm tied off (vasectomy).This blocks sperm from entering the vagina during sexual intercourse. After the procedure, the man can still ejaculate fluid (semen). Natural planning methods  Natural family planning. This is not having sexual intercourse or using a barrier method (condom, diaphragm, cervical cap) on days the woman could become pregnant.  Calendar method. This is keeping track of the length of each menstrual cycle and identifying when you are fertile.  Ovulation method. This is avoiding sexual intercourse during ovulation.  Symptothermal method. This is avoiding sexual intercourse during ovulation, using a thermometer and ovulation  symptoms.  Post-ovulation method. This is timing sexual intercourse after you have ovulated. Regardless of which type or method of contraception you choose, it is important that you use condoms to protect against the transmission of sexually transmitted infections (STIs). Talk with your health care provider about which form of contraception is most appropriate for you. This information is not intended to replace advice given to you by your health care provider. Make sure you discuss any questions you have with your health care provider. Document Released: 01/29/2005 Document Revised: 07/07/2015 Document Reviewed: 07/24/2012 Elsevier Interactive  Patient Education  2017 [employer].   Breastfeeding Deciding to breastfeed is one of the best choices you can make for you and your baby. A change in hormones during pregnancy causes your breast tissue to grow and increases the number and size of your milk ducts. These hormones also allow proteins, sugars, and fats from your blood supply to make breast milk in your milk-producing glands. Hormones prevent breast milk from being released before your baby is born as well as prompt milk flow after birth. Once breastfeeding has begun, thoughts of your baby, as well as his or her sucking or crying, can stimulate the release of milk from your milk-producing glands. Benefits of breastfeeding For Your Baby  Your first milk (colostrum) helps your baby's digestive system function better.  There are antibodies in your milk that help your baby fight off infections.  Your baby has a lower incidence of asthma, allergies, and sudden infant death syndrome.  The nutrients in breast milk are better for your baby than infant formulas and are designed uniquely for your baby's needs.  Breast milk improves your baby's brain development.  Your baby is less likely to develop other conditions, such as childhood obesity, asthma, or type 2 diabetes mellitus. For  You  Breastfeeding helps to create a very special bond between you and your baby.  Breastfeeding is convenient. Breast milk is always available at the correct temperature and costs nothing.  Breastfeeding helps to burn calories and helps you lose the weight gained during pregnancy.  Breastfeeding makes your uterus contract to its prepregnancy size faster and slows bleeding (lochia) after you give birth.  Breastfeeding helps to lower your risk of developing type 2 diabetes mellitus, osteoporosis, and breast or ovarian cancer later in life. Signs that your baby is hungry Early Signs of Hunger  Increased alertness or activity.  Stretching.  Movement of the head from side to side.  Movement of the head and opening of the mouth when the corner of the mouth or cheek is stroked (rooting).  Increased sucking sounds, smacking lips, cooing, sighing, or squeaking.  Hand-to-mouth movements.  Increased sucking of fingers or hands. Late Signs of Hunger  Fussing.  Intermittent crying. Extreme Signs of Hunger  Signs of extreme hunger will require calming and consoling before your baby will be able to breastfeed successfully. Do not wait for the following signs of extreme hunger to occur before you initiate breastfeeding:  Restlessness.  A loud, strong cry.  Screaming. Breastfeeding basics  Breastfeeding Initiation  Find a comfortable place to sit or lie down, with your neck and back well supported.  Place a pillow or rolled up blanket under your baby to bring him or her to the level of your breast (if you are seated). Nursing pillows are specially designed to help support your arms and your baby while you breastfeed.  Make sure that your baby's abdomen is facing your abdomen.  Gently massage your breast. With your fingertips, massage from your chest wall toward your nipple in a circular motion. This encourages milk flow. You may need to continue this action during the feeding if your  milk flows slowly.  Support your breast with 4 fingers underneath and your thumb above your nipple. Make sure your fingers are well away from your nipple and your baby's mouth.  Stroke your baby's lips gently with your finger or nipple.  When your baby's mouth is open wide enough, quickly bring your baby to your breast, placing your entire nipple and  as much of the colored area around your nipple (areola) as possible into your baby's mouth.  More areola should be visible above your baby's upper lip than below the lower lip.  Your baby's tongue should be between his or her lower gum and your breast.  Ensure that your baby's mouth is correctly positioned around your nipple (latched). Your baby's lips should create a seal on your breast and be turned out (everted).  It is common for your baby to suck about 2-3 minutes in order to start the flow of breast milk. Latching  Teaching your baby how to latch on to your breast properly is very important. An improper latch can cause nipple pain and decreased milk supply for you and poor weight gain in your baby. Also, if your baby is not latched onto your nipple properly, he or she may swallow some air during feeding. This can make your baby fussy. Burping your baby when you switch breasts during the feeding can help to get rid of the air. However, teaching your baby to latch on properly is still the best way to prevent fussiness from swallowing air while breastfeeding. Signs that your baby has successfully latched on to your nipple:  Silent tugging or silent sucking, without causing you pain.  Swallowing heard between every 3-4 sucks.  Muscle movement above and in front of his or her ears while sucking. Signs that your baby has not successfully latched on to nipple:  Sucking sounds or smacking sounds from your baby while breastfeeding.  Nipple pain. If you think your baby has not latched on correctly, slip your finger into the corner of your baby's  mouth to break the suction and place it between your baby's gums. Attempt breastfeeding initiation again. Signs of Successful Breastfeeding  Signs from your baby:  A gradual decrease in the number of sucks or complete cessation of sucking.  Falling asleep.  Relaxation of his or her body.  Retention of a small amount of milk in his or her mouth.  Letting go of your breast by himself or herself. Signs from you:  Breasts that have increased in firmness, weight, and size 1-3 hours after feeding.  Breasts that are softer immediately after breastfeeding.  Increased milk volume, as well as a change in milk consistency and color by the fifth day of breastfeeding.  Nipples that are not sore, cracked, or bleeding. Signs That Your [employer] is Getting Enough Milk  Wetting at least 1-2 diapers during the first 24 hours after birth.  Wetting at least 5-6 diapers every 24 hours for the first week after birth. The urine should be clear or pale yellow by 5 days after birth.  Wetting 6-8 diapers every 24 hours as your baby continues to grow and develop.  At least 3 stools in a 24-hour period by age 67 days. The stool should be soft and yellow.  At least 3 stools in a 24-hour period by age 70 days. The stool should be seedy and yellow.  No loss of weight greater than 10% of birth weight during the first 65 days of age.  Average weight gain of 4-7 ounces (113-198 g) per week after age 58 days.  Consistent daily weight gain by age 38 days, without weight loss after the age of 2 weeks. After a feeding, your baby may spit up a small amount. This is common. Breastfeeding frequency and duration Frequent feeding will help you make more milk and can prevent sore nipples and breast engorgement. Breastfeed  when you feel the need to reduce the fullness of your breasts or when your baby shows signs of hunger. This is called "breastfeeding on demand." Avoid introducing a pacifier to your baby while you are working  to establish breastfeeding (the first 4-6 weeks after your baby is born). After this time you may choose to use a pacifier. Research has shown that pacifier use during the first year of a baby's life decreases the risk of sudden infant death syndrome (SIDS). Allow your baby to feed on each breast as long as he or she wants. Breastfeed until your baby is finished feeding. When your baby unlatches or falls asleep while feeding from the first breast, offer the second breast. Because newborns are often sleepy in the first few weeks of life, you may need to awaken your baby to get him or her to feed. Breastfeeding times will vary from baby to baby. However, the following rules can serve as a guide to help you ensure that your baby is properly fed:  Newborns (babies 52 weeks of age or younger) may breastfeed every 1-3 hours.  Newborns should not go longer than 3 hours during the day or 5 hours during the night without breastfeeding.  You should breastfeed your baby a minimum of 8 times in a 24-hour period until you begin to introduce solid foods to your baby at around 23 months of age. Breast milk pumping Pumping and storing breast milk allows you to ensure that your baby is exclusively fed your breast milk, even at times when you are unable to breastfeed. This is especially important if you are going back to work while you are still breastfeeding or when you are not able to be present during feedings. Your lactation consultant can give you guidelines on how long it is safe to store breast milk. A breast pump is a machine that allows you to pump milk from your breast into a sterile bottle. The pumped breast milk can then be stored in a refrigerator or freezer. Some breast pumps are operated by hand, while others use electricity. Ask your lactation consultant which type will work best for you. Breast pumps can be purchased, but some hospitals and breastfeeding support groups lease breast pumps on a monthly basis.  A lactation consultant can teach you how to hand express breast milk, if you prefer not to use a pump. Caring for your breasts while you breastfeed Nipples can become dry, cracked, and sore while breastfeeding. The following recommendations can help keep your breasts moisturized and healthy:  Avoid using soap on your nipples.  Wear a supportive bra. Although not required, special nursing bras and tank tops are designed to allow access to your breasts for breastfeeding without taking off your entire bra or top. Avoid wearing underwire-style bras or extremely tight bras.  Air dry your nipples for 3-37minutes after each feeding.  Use only cotton bra pads to absorb leaked breast milk. Leaking of breast milk between feedings is normal.  Use lanolin on your nipples after breastfeeding. Lanolin helps to maintain your skin's normal moisture barrier. If you use pure lanolin, you do not need to wash it off before feeding your baby again. Pure lanolin is not toxic to your baby. You may also hand express a few drops of breast milk and gently massage that milk into your nipples and allow the milk to air dry. In the first few weeks after giving birth, some women experience extremely full breasts (engorgement). Engorgement can make your breasts  feel heavy, warm, and tender to the touch. Engorgement peaks within 3-5 days after you give birth. The following recommendations can help ease engorgement:  Completely empty your breasts while breastfeeding or pumping. You may want to start by applying warm, moist heat (in the shower or with warm water-soaked hand towels) just before feeding or pumping. This increases circulation and helps the milk flow. If your baby does not completely empty your breasts while breastfeeding, pump any extra milk after he or she is finished.  Wear a snug bra (nursing or regular) or tank top for 1-2 days to signal your body to slightly decrease milk production.  Apply ice packs to your  breasts, unless this is too uncomfortable for you.  Make sure that your baby is latched on and positioned properly while breastfeeding. If engorgement persists after 48 hours of following these recommendations, contact your health care provider or a Science writer. Overall health care recommendations while breastfeeding  Eat healthy foods. Alternate between meals and snacks, eating 3 of each per day. Because what you eat affects your breast milk, some of the foods may make your baby more irritable than usual. Avoid eating these foods if you are sure that they are negatively affecting your baby.  Drink milk, fruit juice, and water to satisfy your thirst (about 10 glasses a day).  Rest often, relax, and continue to take your prenatal vitamins to prevent fatigue, stress, and anemia.  Continue breast self-awareness checks.  Avoid chewing and smoking tobacco. Chemicals from cigarettes that pass into breast milk and exposure to secondhand smoke may harm your baby.  Avoid alcohol and drug use, including marijuana. Some medicines that may be harmful to your baby can pass through breast milk. It is important to ask your health care provider before taking any medicine, including all over-the-counter and prescription medicine as well as vitamin and herbal supplements. It is possible to become pregnant while breastfeeding. If birth control is desired, ask your health care provider about options that will be safe for your baby. Contact a health care provider if:  You feel like you want to stop breastfeeding or have become frustrated with breastfeeding.  You have painful breasts or nipples.  Your nipples are cracked or bleeding.  Your breasts are red, tender, or warm.  You have a swollen area on either breast.  You have a fever or chills.  You have nausea or vomiting.  You have drainage other than breast milk from your nipples.  Your breasts do not become full before feedings by the  fifth day after you give birth.  You feel sad and depressed.  Your baby is too sleepy to eat well.  Your baby is having trouble sleeping.  Your baby is wetting less than 3 diapers in a 24-hour period.  Your baby has less than 3 stools in a 24-hour period.  Your baby's skin or the white part of his or her eyes becomes yellow.  Your baby is not gaining weight by 56 days of age. Get help right away if:  Your baby is overly tired (lethargic) and does not want to wake up and feed.  Your baby develops an unexplained fever. This information is not intended to replace advice given to you by your health care provider. Make sure you discuss any questions you have with your health care provider. Document Released: 01/29/2005 Document Revised: 07/13/2015 Document Reviewed: 07/23/2012 Elsevier Interactive Patient Education  2017 [employer].

## 2016-03-22 NOTE — Progress Notes (Signed)
New ob packet given  First Trimester Screen scheduled for February 15th @ 1100.

## 2016-03-23 LAB — PRENATAL PROFILE (SOLSTAS)
Antibody Screen: NEGATIVE
Basophils Absolute: 0 cells/uL (ref 0–200)
Basophils Relative: 0 %
Eosinophils Absolute: 100 cells/uL (ref 15–500)
Eosinophils Relative: 1 %
HEMATOCRIT: 36.1 % (ref 35.0–45.0)
HIV: NONREACTIVE
Hemoglobin: 12.3 g/dL (ref 11.7–15.5)
Hepatitis B Surface Ag: NEGATIVE
LYMPHS PCT: 25 %
Lymphs Abs: 2500 cells/uL (ref 850–3900)
MCH: 29.7 pg (ref 27.0–33.0)
MCHC: 34.1 g/dL (ref 32.0–36.0)
MCV: 87.2 fL (ref 80.0–100.0)
MONO ABS: 500 {cells}/uL (ref 200–950)
MONOS PCT: 5 %
MPV: 11.3 fL (ref 7.5–12.5)
NEUTROS PCT: 69 %
Neutro Abs: 6900 cells/uL (ref 1500–7800)
PLATELETS: 254 10*3/uL (ref 140–400)
RBC: 4.14 MIL/uL (ref 3.80–5.10)
RDW: 14.2 % (ref 11.0–15.0)
RH TYPE: POSITIVE
Rubella: 6.9 Index — ABNORMAL HIGH (ref <0.90)
WBC: 10 10*3/uL (ref 3.8–10.8)

## 2016-03-23 LAB — GC/CHLAMYDIA PROBE AMP (~~LOC~~) NOT AT ARMC
CHLAMYDIA, DNA PROBE: NEGATIVE
Neisseria Gonorrhea: NEGATIVE

## 2016-03-23 LAB — CULTURE, OB URINE: Organism ID, Bacteria: NO GROWTH

## 2016-03-25 LAB — PAIN MGMT, PROFILE 6 CONF W/O MM, U
6 ACETYLMORPHINE: NEGATIVE ng/mL (ref <10)
ALCOHOL METABOLITES: NEGATIVE ng/mL (ref <500)
AMPHETAMINES: NEGATIVE ng/mL (ref <500)
Barbiturates: NEGATIVE ng/mL (ref <300)
Benzodiazepines: NEGATIVE ng/mL (ref <100)
Cocaine Metabolite: NEGATIVE ng/mL (ref <150)
Creatinine: 199.7 mg/dL (ref >=20.0)
MARIJUANA METABOLITE: 639 ng/mL — AB (ref <5)
MARIJUANA METABOLITE: POSITIVE ng/mL — AB (ref <20)
Methadone Metabolite: NEGATIVE ng/mL (ref <100)
OXYCODONE: NEGATIVE ng/mL (ref <100)
Opiates: NEGATIVE ng/mL (ref <100)
Oxidant: NEGATIVE ug/mL (ref <200)
Phencyclidine: NEGATIVE ng/mL (ref <25)
Please note:: 0
pH: 6.49 (ref 4.5–9.0)

## 2016-03-26 ENCOUNTER — Other Ambulatory Visit: Payer: Self-pay | Admitting: Obstetrics and Gynecology

## 2016-03-26 DIAGNOSIS — E010 Iodine-deficiency related diffuse (endemic) goiter: Secondary | ICD-10-CM

## 2016-03-27 LAB — CYTOLOGY - PAP
ADEQUACY: ABSENT
DIAGNOSIS: NEGATIVE
HPV (WINDOPATH): NOT DETECTED

## 2016-03-27 NOTE — Telephone Encounter (Signed)
Per Dr Elly Modena, patient's TSH was abnormal and needs free T3 and T4 prior to next appt. Per chart review, patient has appt with [facility] 2/15. Called patient at both listed numbers, no answer. No option to leave message

## 2016-03-29 ENCOUNTER — Ambulatory Visit (HOSPITAL_COMMUNITY): Admission: RE | Admit: 2016-03-29 | Payer: Self-pay | Source: Ambulatory Visit

## 2016-03-29 ENCOUNTER — Ambulatory Visit (HOSPITAL_COMMUNITY): Payer: Self-pay | Attending: Family Medicine

## 2016-03-30 NOTE — Telephone Encounter (Signed)
Pt scheduled for echo at [facility] on Friday 2/23 at 2 pm. She should enter through the [place] tower and check in at the admitting desk.

## 2016-04-04 ENCOUNTER — Encounter: Payer: Self-pay | Admitting: *Deleted

## 2016-04-04 NOTE — Telephone Encounter (Signed)
Attempted to call patient at all numbers listed. No option for voicemail on any of her phone numbers. Certified letter to patient.

## 2016-04-06 ENCOUNTER — Ambulatory Visit (HOSPITAL_COMMUNITY)
Admission: RE | Admit: 2016-04-06 | Discharge: 2016-04-06 | Disposition: A | Discharge status: Home / Self Care | Payer: Medicaid Other | Source: Ambulatory Visit | Attending: Obstetrics and Gynecology | Admitting: Obstetrics and Gynecology

## 2016-04-06 DIAGNOSIS — O0990 Supervision of high risk pregnancy, unspecified, unspecified trimester: Secondary | ICD-10-CM

## 2016-04-06 DIAGNOSIS — I309 Acute pericarditis, unspecified: Secondary | ICD-10-CM | POA: Diagnosis not present

## 2016-04-19 ENCOUNTER — Ambulatory Visit (INDEPENDENT_AMBULATORY_CARE_PROVIDER_SITE_OTHER): Payer: Medicaid Other | Admitting: Family Medicine

## 2016-04-19 VITALS — BP 129/60 | HR 62 | Wt 172.0 lb

## 2016-04-19 DIAGNOSIS — O0990 Supervision of high risk pregnancy, unspecified, unspecified trimester: Secondary | ICD-10-CM

## 2016-04-19 DIAGNOSIS — E010 Iodine-deficiency related diffuse (endemic) goiter: Secondary | ICD-10-CM

## 2016-04-19 DIAGNOSIS — Z3482 Encounter for supervision of other normal pregnancy, second trimester: Secondary | ICD-10-CM

## 2016-04-19 DIAGNOSIS — Z113 Encounter for screening for infections with a predominantly sexual mode of transmission: Secondary | ICD-10-CM | POA: Diagnosis not present

## 2016-04-19 LAB — POCT URINALYSIS DIP (DEVICE)
Bilirubin Urine: NEGATIVE
GLUCOSE, UA: NEGATIVE mg/dL
Hgb urine dipstick: NEGATIVE
Ketones, ur: 15 mg/dL — AB
LEUKOCYTES UA: NEGATIVE
Nitrite: NEGATIVE
Protein, ur: NEGATIVE mg/dL
SPECIFIC GRAVITY, URINE: 1.02 (ref 1.005–1.030)
Urobilinogen, UA: 0.2 mg/dL (ref 0.0–1.0)
pH: 5.5 (ref 5.0–8.0)

## 2016-04-19 NOTE — Patient Instructions (Signed)
 Second Trimester of Pregnancy The second trimester is from week 14 through week 27 (months 4 through 6). The second trimester is often a time when you feel your best. Your body has adjusted to being pregnant, and you begin to feel better physically. Usually, morning sickness has lessened or quit completely, you may have more energy, and you may have an increase in appetite. The second trimester is also a time when the fetus is growing rapidly. At the end of the sixth month, the fetus is about 9 inches long and weighs about 1 pounds. You will likely begin to feel the baby move (quickening) between 16 and 20 weeks of pregnancy. Body changes during your second trimester Your body continues to go through many changes during your second trimester. The changes vary from woman to woman.  Your weight will continue to increase. You will notice your lower abdomen bulging out.  You may begin to get stretch marks on your hips, abdomen, and breasts.  You may develop headaches that can be relieved by medicines. The medicines should be approved by your health care provider.  You may urinate more often because the fetus is pressing on your bladder.  You may develop or continue to have heartburn as a result of your pregnancy.  You may develop constipation because certain hormones are causing the muscles that push waste through your intestines to slow down.  You may develop hemorrhoids or swollen, bulging veins (varicose veins).  You may have back pain. This is caused by: ? Weight gain. ? Pregnancy hormones that are relaxing the joints in your pelvis. ? A shift in weight and the muscles that support your balance.  Your breasts will continue to grow and they will continue to become tender.  Your gums may bleed and may be sensitive to brushing and flossing.  Dark spots or blotches (chloasma, mask of pregnancy) may develop on your face. This will likely fade after the baby is born.  A dark line from  your belly button to the pubic area (linea nigra) may appear. This will likely fade after the baby is born.  You may have changes in your hair. These can include thickening of your hair, rapid growth, and changes in texture. Some women also have hair loss during or after pregnancy, or hair that feels dry or thin. Your hair will most likely return to normal after your baby is born.  What to expect at prenatal visits During a routine prenatal visit:  You will be weighed to make sure you and the fetus are growing normally.  Your blood pressure will be taken.  Your abdomen will be measured to track your baby's growth.  The fetal heartbeat will be listened to.  Any test results from the previous visit will be discussed.  Your health care provider may ask you:  How you are feeling.  If you are feeling the baby move.  If you have had any abnormal symptoms, such as leaking fluid, bleeding, severe headaches, or abdominal cramping.  If you are using any tobacco products, including cigarettes, chewing tobacco, and electronic cigarettes.  If you have any questions.  Other tests that may be performed during your second trimester include:  Blood tests that check for: ? Low iron levels (anemia). ? High blood sugar that affects pregnant women (gestational diabetes) between 24 and 28 weeks. ? Rh antibodies. This is to check for a protein on red blood cells (Rh factor).  Urine tests to check for infections, diabetes,   or protein in the urine.  An ultrasound to confirm the proper growth and development of the baby.  An amniocentesis to check for possible genetic problems.  Fetal screens for spina bifida and Down syndrome.  HIV (human immunodeficiency virus) testing. Routine prenatal testing includes screening for HIV, unless you choose not to have this test.  Follow these instructions at home: Medicines  Follow your health care provider's instructions regarding medicine use. Specific  medicines may be either safe or unsafe to take during pregnancy.  Take a prenatal vitamin that contains at least 600 micrograms (mcg) of folic acid.  If you develop constipation, try taking a stool softener if your health care provider approves. Eating and drinking  Eat a balanced diet that includes fresh fruits and vegetables, whole grains, good sources of protein such as meat, eggs, or tofu, and low-fat dairy. Your health care provider will help you determine the amount of weight gain that is right for you.  Avoid raw meat and uncooked cheese. These carry germs that can cause birth defects in the baby.  If you have low calcium intake from food, talk to your health care provider about whether you should take a daily calcium supplement.  Limit foods that are high in fat and processed sugars, such as fried and sweet foods.  To prevent constipation: ? Drink enough fluid to keep your urine clear or pale yellow. ? Eat foods that are high in fiber, such as fresh fruits and vegetables, whole grains, and beans. Activity  Exercise only as directed by your health care provider. Most women can continue their usual exercise routine during pregnancy. Try to exercise for 30 minutes at least 5 days a week. Stop exercising if you experience uterine contractions.  Avoid heavy lifting, wear low heel shoes, and practice good posture.  A sexual relationship may be continued unless your health care provider directs you otherwise. Relieving pain and discomfort  Wear a good support bra to prevent discomfort from breast tenderness.  Take warm sitz baths to soothe any pain or discomfort caused by hemorrhoids. Use hemorrhoid cream if your health care provider approves.  Rest with your legs elevated if you have leg cramps or low back pain.  If you develop varicose veins, wear support hose. Elevate your feet for 15 minutes, 3-4 times a day. Limit salt in your diet. Prenatal Care  Write down your questions.  Take them to your prenatal visits.  Keep all your prenatal visits as told by your health care provider. This is important. Safety  Wear your seat belt at all times when driving.  Make a list of emergency phone numbers, including numbers for family, friends, the hospital, and police and fire departments. General instructions  Ask your health care provider for a referral to a local prenatal education class. Begin classes no later than the beginning of month 6 of your pregnancy.  Ask for help if you have counseling or nutritional needs during pregnancy. Your health care provider can offer advice or refer you to specialists for help with various needs.  Do not use hot tubs, steam rooms, or saunas.  Do not douche or use tampons or scented sanitary pads.  Do not cross your legs for long periods of time.  Avoid cat litter boxes and soil used by cats. These carry germs that can cause birth defects in the baby and possibly loss of the fetus by miscarriage or stillbirth.  Avoid all smoking, herbs, alcohol, and unprescribed drugs. Chemicals in these products   can affect the formation and growth of the baby.  Do not use any products that contain nicotine or tobacco, such as cigarettes and e-cigarettes. If you need help quitting, ask your health care provider.  Visit your dentist if you have not gone yet during your pregnancy. Use a soft toothbrush to brush your teeth and be gentle when you floss. Contact a health care provider if:  You have dizziness.  You have mild pelvic cramps, pelvic pressure, or nagging pain in the abdominal area.  You have persistent nausea, vomiting, or diarrhea.  You have a bad smelling vaginal discharge.  You have pain when you urinate. Get help right away if:  You have a fever.  You are leaking fluid from your vagina.  You have spotting or bleeding from your vagina.  You have severe abdominal cramping or pain.  You have rapid weight gain or weight  loss.  You have shortness of breath with chest pain.  You notice sudden or extreme swelling of your face, hands, ankles, feet, or legs.  You have not felt your baby move in over an hour.  You have severe headaches that do not go away when you take medicine.  You have vision changes. Summary  The second trimester is from week 14 through week 27 (months 4 through 6). It is also a time when the fetus is growing rapidly.  Your body goes through many changes during pregnancy. The changes vary from woman to woman.  Avoid all smoking, herbs, alcohol, and unprescribed drugs. These chemicals affect the formation and growth your baby.  Do not use any tobacco products, such as cigarettes, chewing tobacco, and e-cigarettes. If you need help quitting, ask your health care provider.  Contact your health care provider if you have any questions. Keep all prenatal visits as told by your health care provider. This is important. This information is not intended to replace advice given to you by your health care provider. Make sure you discuss any questions you have with your health care provider. Document Released: 01/23/2001 Document Revised: 07/07/2015 Document Reviewed: 04/01/2012 [employer] Interactive Patient Education  2017 [employer]   Breastfeeding Deciding to breastfeed is one of the best choices you can make for you and your baby. A change in hormones during pregnancy causes your breast tissue to grow and increases the number and size of your milk ducts. These hormones also allow proteins, sugars, and fats from your blood supply to make breast milk in your milk-producing glands. Hormones prevent breast milk from being released before your baby is born as well as prompt milk flow after birth. Once breastfeeding has begun, thoughts of your baby, as well as his or her sucking or crying, can stimulate the release of milk from your milk-producing glands. Benefits of breastfeeding For Your  Baby  Your first milk (colostrum) helps your baby's digestive system function better.  There are antibodies in your milk that help your baby fight off infections.  Your baby has a lower incidence of asthma, allergies, and sudden infant death syndrome.  The nutrients in breast milk are better for your baby than infant formulas and are designed uniquely for your baby's needs.  Breast milk improves your baby's brain development.  Your baby is less likely to develop other conditions, such as childhood obesity, asthma, or type 2 diabetes mellitus.  For You  Breastfeeding helps to create a very special bond between you and your baby.  Breastfeeding is convenient. Breast milk is always available at   the correct temperature and costs nothing.  Breastfeeding helps to burn calories and helps you lose the weight gained during pregnancy.  Breastfeeding makes your uterus contract to its prepregnancy size faster and slows bleeding (lochia) after you give birth.  Breastfeeding helps to lower your risk of developing type 2 diabetes mellitus, osteoporosis, and breast or ovarian cancer later in life.  Signs that your baby is hungry Early Signs of Hunger  Increased alertness or activity.  Stretching.  Movement of the head from side to side.  Movement of the head and opening of the mouth when the corner of the mouth or cheek is stroked (rooting).  Increased sucking sounds, smacking lips, cooing, sighing, or squeaking.  Hand-to-mouth movements.  Increased sucking of fingers or hands.  Late Signs of Hunger  Fussing.  Intermittent crying.  Extreme Signs of Hunger Signs of extreme hunger will require calming and consoling before your baby will be able to breastfeed successfully. Do not wait for the following signs of extreme hunger to occur before you initiate breastfeeding:  Restlessness.  A loud, strong cry.  Screaming.  Breastfeeding basics Breastfeeding Initiation  Find a  comfortable place to sit or lie down, with your neck and back well supported.  Place a pillow or rolled up blanket under your baby to bring him or her to the level of your breast (if you are seated). Nursing pillows are specially designed to help support your arms and your baby while you breastfeed.  Make sure that your baby's abdomen is facing your abdomen.  Gently massage your breast. With your fingertips, massage from your chest wall toward your nipple in a circular motion. This encourages milk flow. You may need to continue this action during the feeding if your milk flows slowly.  Support your breast with 4 fingers underneath and your thumb above your nipple. Make sure your fingers are well away from your nipple and your baby's mouth.  Stroke your baby's lips gently with your finger or nipple.  When your baby's mouth is open wide enough, quickly bring your baby to your breast, placing your entire nipple and as much of the colored area around your nipple (areola) as possible into your baby's mouth. ? More areola should be visible above your baby's upper lip than below the lower lip. ? Your baby's tongue should be between his or her lower gum and your breast.  Ensure that your baby's mouth is correctly positioned around your nipple (latched). Your baby's lips should create a seal on your breast and be turned out (everted).  It is common for your baby to suck about 2-3 minutes in order to start the flow of breast milk.  Latching Teaching your baby how to latch on to your breast properly is very important. An improper latch can cause nipple pain and decreased milk supply for you and poor weight gain in your baby. Also, if your baby is not latched onto your nipple properly, he or she may swallow some air during feeding. This can make your baby fussy. Burping your baby when you switch breasts during the feeding can help to get rid of the air. However, teaching your baby to latch on properly is  still the best way to prevent fussiness from swallowing air while breastfeeding. Signs that your baby has successfully latched on to your nipple:  Silent tugging or silent sucking, without causing you pain.  Swallowing heard between every 3-4 sucks.  Muscle movement above and in front of his or her   ears while sucking.  Signs that your baby has not successfully latched on to nipple:  Sucking sounds or smacking sounds from your baby while breastfeeding.  Nipple pain.  If you think your baby has not latched on correctly, slip your finger into the corner of your baby's mouth to break the suction and place it between your baby's gums. Attempt breastfeeding initiation again. Signs of Successful Breastfeeding Signs from your baby:  A gradual decrease in the number of sucks or complete cessation of sucking.  Falling asleep.  Relaxation of his or her body.  Retention of a small amount of milk in his or her mouth.  Letting go of your breast by himself or herself.  Signs from you:  Breasts that have increased in firmness, weight, and size 1-3 hours after feeding.  Breasts that are softer immediately after breastfeeding.  Increased milk volume, as well as a change in milk consistency and color by the fifth day of breastfeeding.  Nipples that are not sore, cracked, or bleeding.  Signs That Your Baby is Getting Enough Milk  Wetting at least 1-2 diapers during the first 24 hours after birth.  Wetting at least 5-6 diapers every 24 hours for the first week after birth. The urine should be clear or pale yellow by 5 days after birth.  Wetting 6-8 diapers every 24 hours as your baby continues to grow and develop.  At least 3 stools in a 24-hour period by age 5 days. The stool should be soft and yellow.  At least 3 stools in a 24-hour period by age 7 days. The stool should be seedy and yellow.  No loss of weight greater than 10% of birth weight during the first 3 days of age.  Average  weight gain of 4-7 ounces (113-198 g) per week after age 4 days.  Consistent daily weight gain by age 5 days, without weight loss after the age of 2 weeks.  After a feeding, your baby may spit up a small amount. This is common. Breastfeeding frequency and duration Frequent feeding will help you make more milk and can prevent sore nipples and breast engorgement. Breastfeed when you feel the need to reduce the fullness of your breasts or when your baby shows signs of hunger. This is called "breastfeeding on demand." Avoid introducing a pacifier to your baby while you are working to establish breastfeeding (the first 4-6 weeks after your baby is born). After this time you may choose to use a pacifier. Research has shown that pacifier use during the first year of a baby's life decreases the risk of sudden infant death syndrome (SIDS). Allow your baby to feed on each breast as long as he or she wants. Breastfeed until your baby is finished feeding. When your baby unlatches or falls asleep while feeding from the first breast, offer the second breast. Because newborns are often sleepy in the first few weeks of life, you may need to awaken your baby to get him or her to feed. Breastfeeding times will vary from baby to baby. However, the following rules can serve as a guide to help you ensure that your baby is properly fed:  Newborns (babies 4 weeks of age or younger) may breastfeed every 1-3 hours.  Newborns should not go longer than 3 hours during the day or 5 hours during the night without breastfeeding.  You should breastfeed your baby a minimum of 8 times in a 24-hour period until you begin to introduce solid foods to your   baby at around 6 months of age.  Breast milk pumping Pumping and storing breast milk allows you to ensure that your baby is exclusively fed your breast milk, even at times when you are unable to breastfeed. This is especially important if you are going back to work while you are still  breastfeeding or when you are not able to be present during feedings. Your lactation consultant can give you guidelines on how long it is safe to store breast milk. A breast pump is a machine that allows you to pump milk from your breast into a sterile bottle. The pumped breast milk can then be stored in a refrigerator or freezer. Some breast pumps are operated by hand, while others use electricity. Ask your lactation consultant which type will work best for you. Breast pumps can be purchased, but some hospitals and breastfeeding support groups lease breast pumps on a monthly basis. A lactation consultant can teach you how to hand express breast milk, if you prefer not to use a pump. Caring for your breasts while you breastfeed Nipples can become dry, cracked, and sore while breastfeeding. The following recommendations can help keep your breasts moisturized and healthy:  Avoid using soap on your nipples.  Wear a supportive bra. Although not required, special nursing bras and tank tops are designed to allow access to your breasts for breastfeeding without taking off your entire bra or top. Avoid wearing underwire-style bras or extremely tight bras.  Air dry your nipples for 3-4minutes after each feeding.  Use only cotton bra pads to absorb leaked breast milk. Leaking of breast milk between feedings is normal.  Use lanolin on your nipples after breastfeeding. Lanolin helps to maintain your skin's normal moisture barrier. If you use pure lanolin, you do not need to wash it off before feeding your baby again. Pure lanolin is not toxic to your baby. You may also hand express a few drops of breast milk and gently massage that milk into your nipples and allow the milk to air dry.  In the first few weeks after giving birth, some women experience extremely full breasts (engorgement). Engorgement can make your breasts feel heavy, warm, and tender to the touch. Engorgement peaks within 3-5 days after you give  birth. The following recommendations can help ease engorgement:  Completely empty your breasts while breastfeeding or pumping. You may want to start by applying warm, moist heat (in the shower or with warm water-soaked hand towels) just before feeding or pumping. This increases circulation and helps the milk flow. If your baby does not completely empty your breasts while breastfeeding, pump any extra milk after he or she is finished.  Wear a snug bra (nursing or regular) or tank top for 1-2 days to signal your body to slightly decrease milk production.  Apply ice packs to your breasts, unless this is too uncomfortable for you.  Make sure that your baby is latched on and positioned properly while breastfeeding.  If engorgement persists after 48 hours of following these recommendations, contact your health care provider or a lactation consultant. Overall health care recommendations while breastfeeding  Eat healthy foods. Alternate between meals and snacks, eating 3 of each per day. Because what you eat affects your breast milk, some of the foods may make your baby more irritable than usual. Avoid eating these foods if you are sure that they are negatively affecting your baby.  Drink milk, fruit juice, and water to satisfy your thirst (about 10 glasses a day).    Rest often, relax, and continue to take your prenatal vitamins to prevent fatigue, stress, and anemia.  Continue breast self-awareness checks.  Avoid chewing and smoking tobacco. Chemicals from cigarettes that pass into breast milk and exposure to secondhand smoke may harm your baby.  Avoid alcohol and drug use, including marijuana. Some medicines that may be harmful to your baby can pass through breast milk. It is important to ask your health care provider before taking any medicine, including all over-the-counter and prescription medicine as well as vitamin and herbal supplements. It is possible to become pregnant while breastfeeding.  If birth control is desired, ask your health care provider about options that will be safe for your baby. Contact a health care provider if:  You feel like you want to stop breastfeeding or have become frustrated with breastfeeding.  You have painful breasts or nipples.  Your nipples are cracked or bleeding.  Your breasts are red, tender, or warm.  You have a swollen area on either breast.  You have a fever or chills.  You have nausea or vomiting.  You have drainage other than breast milk from your nipples.  Your breasts do not become full before feedings by the fifth day after you give birth.  You feel sad and depressed.  Your baby is too sleepy to eat well.  Your baby is having trouble sleeping.  Your baby is wetting less than 3 diapers in a 24-hour period.  Your baby has less than 3 stools in a 24-hour period.  Your baby's skin or the white part of his or her eyes becomes yellow.  Your baby is not gaining weight by 5 days of age. Get help right away if:  Your baby is overly tired (lethargic) and does not want to wake up and feed.  Your baby develops an unexplained fever. This information is not intended to replace advice given to you by your health care provider. Make sure you discuss any questions you have with your health care provider. Document Released: 01/29/2005 Document Revised: 07/13/2015 Document Reviewed: 07/23/2012 [employer] Interactive Patient Education  2017 [employer]  

## 2016-04-20 LAB — CERVICOVAGINAL ANCILLARY ONLY
Bacterial vaginitis: POSITIVE — AB
Candida vaginitis: POSITIVE — AB
Chlamydia: NEGATIVE
Neisseria Gonorrhea: NEGATIVE
TRICH (WINDOWPATH): NEGATIVE

## 2016-04-20 NOTE — Progress Notes (Signed)
   PRENATAL VISIT NOTE  Subjective:  Jennifer Dunlap is a 36 y.o. [id number] at [redacted]w[redacted]d being seen today for ongoing prenatal care.  She is currently monitored for the following issues for this low-risk pregnancy and has History of maternal pericarditis; Thyromegaly; History of bilateral tubal ligation; and Supervision of high risk pregnancy, antepartum on her problem list.  Patient reports no complaints.  Contractions: Not present. Vag. Bleeding: None.  Movement: Absent. Denies leaking of fluid.   The following portions of the patient's history were reviewed and updated as appropriate: allergies, current medications, past family history, past medical history, past social history, past surgical history and problem list. Problem list updated.  Objective:   Vitals:   04/19/16 1356  BP: 129/60  Pulse: 62  Weight: 172 lb (78 kg)    Fetal Status: Fetal Heart Rate (bpm): 143 Fundal Height: 18 cm Movement: Absent     General:  Alert, oriented and cooperative. Patient is in no acute distress.  Skin: Skin is warm and dry. No rash noted.   Cardiovascular: Normal heart rate noted  Respiratory: Normal respiratory effort, no problems with respiration noted  Abdomen: Soft, gravid, appropriate for gestational age. Pain/Pressure: Absent     Pelvic:  Cervical exam deferred        Extremities: Normal range of motion.  Edema: None  Mental Status: Normal mood and affect. Normal behavior. Normal judgment and thought content.   Assessment and Plan:  Pregnancy: [id number] at [redacted]w[redacted]d  1. Supervision of high risk pregnancy, antepartum Continue routine prenatal care. Anatomy u/s scheduled - [place] MFM OB COMP + 14 WK; Future - Cervicovaginal ancillary only  2. Thyromegaly Normal TFT's. Has not had an u/s since 2016--will repeat. Gets larger in pregnancy - US THYROID; Future  General obstetric precautions including but not limited to vaginal bleeding, contractions, leaking of fluid and fetal movement were reviewed  in detail with the patient. Please refer to After Visit Summary for other counseling recommendations.  Return in 4 weeks (on 05/17/2016).   Donnamae Jude, MD

## 2016-04-23 ENCOUNTER — Telehealth: Payer: Self-pay | Admitting: General Practice

## 2016-04-23 DIAGNOSIS — B9689 Other specified bacterial agents as the cause of diseases classified elsewhere: Secondary | ICD-10-CM

## 2016-04-23 DIAGNOSIS — B379 Candidiasis, unspecified: Secondary | ICD-10-CM

## 2016-04-23 DIAGNOSIS — N760 Acute vaginitis: Principal | ICD-10-CM

## 2016-04-23 MED ORDER — TERCONAZOLE 0.4 % VA CREA
1.0000 | TOPICAL_CREAM | Freq: Every day | VAGINAL | 0 refills | Status: DC
Start: 2016-04-23 — End: 2016-06-05

## 2016-04-23 MED ORDER — METRONIDAZOLE 500 MG PO TABS: 500.0000 mg | ORAL_TABLET | Freq: Two times a day (BID) | ORAL | 0 refills | Status: DC

## 2016-04-23 NOTE — Telephone Encounter (Signed)
Patient has BV & yeast and needs flagyl & terconazole called in. Called patient, no answer & no VM set up. Called emergency contact & left message with mother for patient to call us back. meds ordered

## 2016-04-24 NOTE — Telephone Encounter (Addendum)
Pt left message today @ [phone] stating that she is returning our call.   3/15 1625  Called pt and she did not answer. Unable to leave a message due to no voice mail. Called pharmacy and pt has not picked up medication. Letter sent.

## 2016-04-26 ENCOUNTER — Ambulatory Visit (HOSPITAL_COMMUNITY): Payer: Self-pay

## 2016-04-26 ENCOUNTER — Encounter: Payer: Self-pay | Admitting: *Deleted

## 2016-05-10 ENCOUNTER — Ambulatory Visit (HOSPITAL_COMMUNITY)
Admission: RE | Admit: 2016-05-10 | Discharge: 2016-05-10 | Disposition: A | Discharge status: Home / Self Care | Payer: Medicaid Other | Source: Ambulatory Visit | Attending: Family Medicine | Admitting: Family Medicine

## 2016-05-10 ENCOUNTER — Encounter (HOSPITAL_COMMUNITY): Payer: Self-pay

## 2016-05-10 ENCOUNTER — Other Ambulatory Visit: Payer: Self-pay | Admitting: Family Medicine

## 2016-05-10 DIAGNOSIS — Z3A19 19 weeks gestation of pregnancy: Secondary | ICD-10-CM

## 2016-05-10 DIAGNOSIS — O99282 Endocrine, nutritional and metabolic diseases complicating pregnancy, second trimester: Secondary | ICD-10-CM | POA: Insufficient documentation

## 2016-05-10 DIAGNOSIS — Z363 Encounter for antenatal screening for malformations: Secondary | ICD-10-CM

## 2016-05-10 DIAGNOSIS — O99280 Endocrine, nutritional and metabolic diseases complicating pregnancy, unspecified trimester: Secondary | ICD-10-CM

## 2016-05-10 DIAGNOSIS — O3410 Maternal care for benign tumor of corpus uteri, unspecified trimester: Secondary | ICD-10-CM

## 2016-05-10 DIAGNOSIS — O2690 Pregnancy related conditions, unspecified, unspecified trimester: Secondary | ICD-10-CM

## 2016-05-10 DIAGNOSIS — E079 Disorder of thyroid, unspecified: Secondary | ICD-10-CM | POA: Insufficient documentation

## 2016-05-10 DIAGNOSIS — O3412 Maternal care for benign tumor of corpus uteri, second trimester: Secondary | ICD-10-CM | POA: Diagnosis not present

## 2016-05-10 DIAGNOSIS — D259 Leiomyoma of uterus, unspecified: Secondary | ICD-10-CM | POA: Diagnosis not present

## 2016-05-10 DIAGNOSIS — O0990 Supervision of high risk pregnancy, unspecified, unspecified trimester: Secondary | ICD-10-CM

## 2016-05-14 ENCOUNTER — Ambulatory Visit
Admission: RE | Admit: 2016-05-14 | Discharge: 2016-05-14 | Disposition: A | Discharge status: Home / Self Care | Payer: Medicaid Other | Source: Ambulatory Visit | Attending: Family Medicine | Admitting: Family Medicine

## 2016-05-14 DIAGNOSIS — E010 Iodine-deficiency related diffuse (endemic) goiter: Secondary | ICD-10-CM

## 2016-05-17 ENCOUNTER — Encounter: Payer: Self-pay | Admitting: Obstetrics and Gynecology

## 2016-05-17 NOTE — Progress Notes (Signed)
Patient did not keep OB appointment for 05/17/2016.  Durene Romans MD Attending Center for [employer])

## 2016-05-21 ENCOUNTER — Ambulatory Visit (INDEPENDENT_AMBULATORY_CARE_PROVIDER_SITE_OTHER): Payer: Medicaid Other | Admitting: Obstetrics & Gynecology

## 2016-05-21 VITALS — BP 122/63 | HR 70 | Wt 167.7 lb

## 2016-05-21 DIAGNOSIS — O0992 Supervision of high risk pregnancy, unspecified, second trimester: Secondary | ICD-10-CM

## 2016-05-21 DIAGNOSIS — E010 Iodine-deficiency related diffuse (endemic) goiter: Secondary | ICD-10-CM

## 2016-05-21 DIAGNOSIS — O0990 Supervision of high risk pregnancy, unspecified, unspecified trimester: Secondary | ICD-10-CM

## 2016-05-21 NOTE — Progress Notes (Signed)
PRENATAL VISIT NOTE  Subjective:  Jennifer Dunlap is a 36 y.o. [id number] at [redacted]w[redacted]d being seen today for ongoing prenatal care.  She is currently monitored for the following issues for this high-risk pregnancy and has History of maternal pericarditis; Thyromegaly; History of bilateral tubal ligation; and Supervision of high risk pregnancy, antepartum on her problem list.  Patient reports no complaints.  Contractions: Not present. Vag. Bleeding: None.  Movement: Present. Denies leaking of fluid.   The following portions of the patient's history were reviewed and updated as appropriate: allergies, current medications, past family history, past medical history, past social history, past surgical history and problem list. Problem list updated.  Objective:   Vitals:   05/21/16 1515  BP: 122/63  Pulse: 70  Weight: 167 lb 11.2 oz (76.1 kg)    Fetal Status: Fetal Heart Rate (bpm): 145 Fundal Height: 21 cm Movement: Present     General:  Alert, oriented and cooperative. Patient is in no acute distress.  Skin: Skin is warm and dry. No rash noted.   Cardiovascular: Normal heart rate noted  Respiratory: Normal respiratory effort, no problems with respiration noted  Abdomen: Soft, gravid, appropriate for gestational age. Pain/Pressure: Absent     Pelvic:  Cervical exam deferred        Extremities: Normal range of motion.  Edema: None  Mental Status: Normal mood and affect. Normal behavior. Normal judgment and thought content.   US Thyroid  Result Date: 05/15/2016 CLINICAL DATA:  Right thyroid enlargement, goiter EXAM: THYROID ULTRASOUND TECHNIQUE: Ultrasound examination of the thyroid gland and adjacent soft tissues was performed. COMPARISON:  04/07/2014 FINDINGS: Parenchymal Echotexture: Mildly heterogenous Isthmus: 4 mm, unchanged Right lobe: 8.1 x 4.0 x 3.3 cm, previously 6.5 x 2.8 x 4.1 cm Left lobe: 4.8 x 1.4 x 1.4 cm, previously 4.7 x 1.2 x 1.4 cm  _________________________________________________________ Estimated total number of nodules >/= 1 cm: 2 Number of spongiform nodules >/=  2 cm not described below (TR1): 0 Number of mixed cystic and solid nodules >/= 1.5 cm not described below ([place]): 0 _________________________________________________________ Nodule # 1: Location: Right; Superior Maximum size: 2.5 cm; Other 2 dimensions: 2.1 x 1.7 cm Composition: solid/almost completely solid (2) Echogenicity: hyperechoic (1) Shape: taller-than-wide (3) Margins: ill-defined (0) Echogenic foci: none (0) ACR TI-RADS total points: 6. ACR TI-RADS risk category: TR4 (4-6 points). ACR TI-RADS recommendations: **Given size (>/= 1.5 cm) and appearance, fine needle aspiration of this moderately suspicious nodule should be considered based on TI-RADS criteria. _________________________________________________________ Nodule # 2: Location: Right; Inferior Maximum size: 5.9, unchanged Cm; Other 2 dimensions: 3.4 x 3.6, previously 2.4 x 2.9 cm Composition: solid/almost completely solid (2) Echogenicity: isoechoic (1) Shape: not taller-than-wide (0) Margins: lobulated/irregular (2) Echogenic foci: none (0) ACR TI-RADS total points: 5. ACR TI-RADS risk category: TR4 (4-6 points). ACR TI-RADS recommendations: **Given size (>/= 1.5 cm) and appearance, fine needle aspiration of this moderately suspicious nodule should be considered based on TI-RADS criteria. _________________________________________________________ Left thyroid lobe remains normal in appearance without discrete nodule or focal abnormality. IMPRESSION: 2 dominant right solid thyroid nodules, compatible with TR 4 nodules. Both of these nodules meet criteria for biopsy as above. The above is in keeping with the ACR TI-RADS recommendations - J Am Coll Radiol 2017;14:587-595. Electronically Signed   By: Jerilynn Mages.  Shick M.D.   On: 05/15/2016 16:14   [place] Mfm Ob Comp + 14 Wk  Result Date:  05/10/2016 ----------------------------------------------------------------------  OBSTETRICS REPORT                      (  Signed Final 05/10/2016 11:40 am) ---------------------------------------------------------------------- Patient Info  ID #:       [id number]                         D.O.B.:   08-01-1980 (35 yrs)  Name:       Jennifer Dunlap                   Visit Date:  05/10/2016 10:27 am ---------------------------------------------------------------------- Performed By  Performed By:     Jacob Moores BS,       Secondary Phy.:   [place], [place] for                                                             [place]  Attending:        Griffin Dakin MD         Address:          [facility]                                                             [place], [place]  [address]  Referred By:      Donnamae Jude          Location:         [facility]                    MD  Ref. Address:     [place], [place] ---------------------------------------------------------------------- Orders   #  Description                                 Code   1  [place] MFM OB COMP + 14 WK                      76805.01  ----------------------------------------------------------------------   #  Ordered By               Order #        Accession #    Episode #   1  Darron Doom              [id number]      [id number]     [id number]  ---------------------------------------------------------------------- Indications   [redacted] weeks gestation  of pregnancy                Z3A.19   Encounter for antenatal screening for          Z36.3   malformations   Medical complication of pregnancy (history     O26.90   of pericarditis)   Thyroid disease in pregnancy                   O99.280, E07.9   Uterine fibroids affecting pregnancy in        O34.12, D25.9   second trimester, antepartum  ---------------------------------------------------------------------- OB History  Blood Type:            Height:  5'4"   Weight (lb):  169      BMI:   29.01  Gravidity:    10        Term:   7        Prem:   0        SAB:   0  TOP:          2       Ectopic:  0        Living: 7 ---------------------------------------------------------------------- Fetal Evaluation  Num Of Fetuses:     1  Fetal Heart         141  Rate(bpm):  Cardiac Activity:   Observed  Presentation:       Variable  Placenta:           Anterior, above cervical os  P. Cord Insertion:  Visualized, central  Amniotic Fluid  AFI FV:      Subjectively within normal limits                              Largest Pocket(cm)  5.8 ---------------------------------------------------------------------- Biometry  BPD:      42.5  mm     G. Age:  18w 6d         38  %    CI:        76.28   %   70 - 86                                                          FL/HC:      17.3   %   16.1 - 18.3  HC:      154.2  mm     G. Age:  18w 3d         13  %    HC/AC:      1.12       1.09 - 1.39  AC:      137.1  mm     G. Age:  19w 1d         59  %    FL/BPD:     62.6   %  FL:       26.6  mm     G. Age:  18w 1d         12  %    FL/AC:      19.4   %   20 - 24  HUM:      25.6  mm     G. Age:  18w 0d         20  %  CER:      18.9  mm     G. Age:  18w 3d         30  %  NFT:         3  mm  CM:        5.4  mm  Est. FW:     250  gm      0 lb 9 oz     35  % ---------------------------------------------------------------------- Gestational Age  LMP:           19w 1d       Date:   12/28/15                 EDD:   10/03/16  U/S  Today:     18w 5d                                        EDD:   10/06/16  Best:          19w 1d    Det. By:   LMP  (12/28/15)          EDD:   10/03/16 ---------------------------------------------------------------------- Anatomy  Cranium:               Appears normal         Aortic Arch:            Appears normal  Cavum:                 Appears normal         Ductal Arch:            Appears normal  Ventricles:  Appears normal         Diaphragm:              Appears normal  Choroid Plexus:        Appears normal         Stomach:                Appears normal, left                                                                        sided  Cerebellum:            Appears normal         Abdomen:                Appears normal  Posterior Fossa:       Appears normal         Abdominal Wall:         Appears nml (cord                                                                        insert, abd wall)  Nuchal Fold:           Appears normal         Cord Vessels:           Appears normal (3                                                                        vessel cord)  Face:                  Appears normal         Kidneys:                Appear normal                         (orbits and profile)  Lips:                  Appears normal         Bladder:                Appears normal  Thoracic:              Appears normal         Spine:                  Appears normal  Heart:                 Appears normal         Upper Extremities:      Appears normal                         (  4CH, axis, and situs  RVOT:                  Appears normal         Lower Extremities:      Appears normal  LVOT:                  Appears normal  Other:  Fetus appears to be a female. Heels and 5th digit visualized. Nasal          bone visualized. ---------------------------------------------------------------------- Cervix Uterus Adnexa  Cervix  Length:           4.61  cm.  Normal appearance by transabdominal scan.  Uterus  No  abnormality visualized.  Left Ovary  Size(cm)     2.53  x    2.13   x  1.31      Vol(ml): 3.7  Within normal limits. No adnexal mass visualized.  Right Ovary  Size(cm)     2.73  x    2.86   x  2.07      Vol(ml): 8.5  Within normal limits. No adnexal mass visualized.  Cul De Sac:   No free fluid seen.  Adnexa:       No abnormality visualized. ---------------------------------------------------------------------- Impression  Singleton intrauterine pregnancy at 19+1 weeks, here for  anatomic survey. history of non-toxic goiter  Review of the anatomy shows no sonographic markers for  aneuploidy or structural anomalies  Amniotic fluid volume is normal  Estimated fetal weight is 250g which is growth in the 35th  percentile ---------------------------------------------------------------------- Recommendations  Repeat scan in 4 weeks to confirm normal growth ----------------------------------------------------------------------                 Griffin Dakin, MD Electronically Signed Final Report   05/10/2016 11:40 am ----------------------------------------------------------------------   Assessment and Plan:  Pregnancy: [id number] at [redacted]w[redacted]d  1. Thyromegaly Discussed results of recent scan with patient.  Needs referral to ENT given suspicious nodules. Will also recheck TFTs.  - [place] MFM OB FOLLOW UP; Future - Ambulatory referral to ENT - TSH - T3, free - T4, free  2. Supervision of high risk pregnancy, antepartum Normal anatomy scan, follow up growth scan ordered. Declined quad screen. Preterm labor symptoms and general obstetric precautions including but not limited to vaginal bleeding, contractions, leaking of fluid and fetal movement were reviewed in detail with the patient. Please refer to After Visit Summary for other counseling recommendations.  Return in about 4 weeks (around 06/18/2016) for OB Visit.   Osborne Oman, MD

## 2016-05-21 NOTE — Progress Notes (Signed)
Patient referred to [facility] ENT scheduled an appointment for May 2nd at 2:20pm.

## 2016-05-21 NOTE — Patient Instructions (Signed)
Return to clinic for any scheduled appointments or obstetric concerns, or go to MAU for evaluation  

## 2016-05-22 ENCOUNTER — Encounter: Payer: Self-pay | Admitting: General Practice

## 2016-05-22 LAB — T4, FREE: Free T4: 1.08 ng/dL (ref 0.82–1.77)

## 2016-05-22 LAB — T3, FREE: T3 FREE: 2.8 pg/mL (ref 2.0–4.4)

## 2016-05-22 LAB — TSH: TSH: 1.05 u[IU]/mL (ref 0.450–4.500)

## 2016-05-31 ENCOUNTER — Ambulatory Visit (HOSPITAL_COMMUNITY): Payer: Medicaid Other

## 2016-06-05 ENCOUNTER — Inpatient Hospital Stay (HOSPITAL_COMMUNITY): Payer: Medicaid Other

## 2016-06-05 ENCOUNTER — Encounter (HOSPITAL_COMMUNITY): Payer: Self-pay

## 2016-06-05 ENCOUNTER — Inpatient Hospital Stay (HOSPITAL_COMMUNITY)
Admission: AD | Admit: 2016-06-05 | Discharge: 2016-06-05 | Disposition: A | Discharge status: Home / Self Care | Payer: Medicaid Other | Source: Ambulatory Visit | Attending: Family Medicine | Admitting: Family Medicine

## 2016-06-05 DIAGNOSIS — N939 Abnormal uterine and vaginal bleeding, unspecified: Secondary | ICD-10-CM

## 2016-06-05 DIAGNOSIS — R109 Unspecified abdominal pain: Secondary | ICD-10-CM | POA: Insufficient documentation

## 2016-06-05 DIAGNOSIS — O4692 Antepartum hemorrhage, unspecified, second trimester: Secondary | ICD-10-CM | POA: Insufficient documentation

## 2016-06-05 DIAGNOSIS — B9689 Other specified bacterial agents as the cause of diseases classified elsewhere: Secondary | ICD-10-CM | POA: Insufficient documentation

## 2016-06-05 DIAGNOSIS — Z3A22 22 weeks gestation of pregnancy: Secondary | ICD-10-CM | POA: Insufficient documentation

## 2016-06-05 DIAGNOSIS — O26892 Other specified pregnancy related conditions, second trimester: Secondary | ICD-10-CM | POA: Diagnosis present

## 2016-06-05 DIAGNOSIS — B379 Candidiasis, unspecified: Secondary | ICD-10-CM

## 2016-06-05 DIAGNOSIS — R102 Pelvic and perineal pain: Secondary | ICD-10-CM

## 2016-06-05 DIAGNOSIS — O26899 Other specified pregnancy related conditions, unspecified trimester: Secondary | ICD-10-CM

## 2016-06-05 DIAGNOSIS — N760 Acute vaginitis: Secondary | ICD-10-CM

## 2016-06-05 LAB — COMPREHENSIVE METABOLIC PANEL
ALK PHOS: 52 U/L (ref 38–126)
ALT: 9 U/L — AB (ref 14–54)
ANION GAP: 5 (ref 5–15)
AST: 13 U/L — ABNORMAL LOW (ref 15–41)
Albumin: 3.1 g/dL — ABNORMAL LOW (ref 3.5–5.0)
BILIRUBIN TOTAL: 0.4 mg/dL (ref 0.3–1.2)
BUN: 6 mg/dL (ref 6–20)
CALCIUM: 9.5 mg/dL (ref 8.9–10.3)
CO2: 25 mmol/L (ref 22–32)
CREATININE: 0.48 mg/dL (ref 0.44–1.00)
Chloride: 106 mmol/L (ref 101–111)
GFR calc non Af Amer: >60 mL/min (ref >60)
GLUCOSE: 102 mg/dL — AB (ref 65–99)
Potassium: 3.2 mmol/L — ABNORMAL LOW (ref 3.5–5.1)
SODIUM: 136 mmol/L (ref 135–145)
TOTAL PROTEIN: 6.5 g/dL (ref 6.5–8.1)

## 2016-06-05 LAB — WET PREP, GENITAL
Sperm: NONE SEEN
Trich, Wet Prep: NONE SEEN

## 2016-06-05 LAB — URINALYSIS, ROUTINE W REFLEX MICROSCOPIC
BACTERIA UA: NONE SEEN
BILIRUBIN URINE: NEGATIVE
GLUCOSE, UA: NEGATIVE mg/dL
HGB URINE DIPSTICK: NEGATIVE
KETONES UR: 5 mg/dL — AB
NITRITE: NEGATIVE
PROTEIN: NEGATIVE mg/dL
Specific Gravity, Urine: 1.014 (ref 1.005–1.030)
pH: 7 (ref 5.0–8.0)

## 2016-06-05 LAB — CBC
HEMATOCRIT: 29.5 % — AB (ref 36.0–46.0)
HEMOGLOBIN: 10.5 g/dL — AB (ref 12.0–15.0)
MCH: 31.3 pg (ref 26.0–34.0)
MCHC: 35.6 g/dL (ref 30.0–36.0)
MCV: 87.8 fL (ref 78.0–100.0)
Platelets: 201 10*3/uL (ref 150–400)
RBC: 3.36 MIL/uL — AB (ref 3.87–5.11)
RDW: 13.4 % (ref 11.5–15.5)
WBC: 6.8 10*3/uL (ref 4.0–10.5)

## 2016-06-05 MED ORDER — IBUPROFEN 600 MG PO TABS: 600.0000 mg | ORAL_TABLET | Freq: Four times a day (QID) | ORAL | 0 refills | Status: DC | PRN

## 2016-06-05 MED ORDER — IBUPROFEN 600 MG PO TABS
600.0000 mg | ORAL_TABLET | Freq: Once | ORAL | Status: AC
  Administered 2016-06-05: 600 mg via ORAL
  Filled 2016-06-05: qty 1

## 2016-06-05 MED ORDER — TERCONAZOLE 0.4 % VA CREA: 1.0000 | TOPICAL_CREAM | Freq: Every day | VAGINAL | 0 refills | Status: DC

## 2016-06-05 MED ORDER — CYCLOBENZAPRINE HCL 10 MG PO TABS
10.0000 mg | ORAL_TABLET | Freq: Once | ORAL | Status: AC
  Administered 2016-06-05: 10 mg via ORAL
  Filled 2016-06-05: qty 1

## 2016-06-05 MED ORDER — METRONIDAZOLE 500 MG PO TABS: 500.0000 mg | ORAL_TABLET | Freq: Two times a day (BID) | ORAL | 0 refills | Status: DC

## 2016-06-05 NOTE — MAU Note (Signed)
Patient presents with c/o vaginal spotting that started yesterday and ride side abdominal pain that started today. Fetus active.

## 2016-06-05 NOTE — MAU Provider Note (Signed)
Patient Jennifer Dunlap is a 36 year old [id number] At [redacted]w[redacted]d here with complaints of spotting and right sided abdominal pain for two days. Last week she had some umbilical tenderness but it went away.   History     CSN: [id number]  Arrival date and time: 06/05/16 1739   First Provider Initiated Contact with Patient 06/05/16 1818      Chief Complaint  Patient presents with  . Abdominal Pain   Abdominal Pain  This is a new problem. The current episode started yesterday. The onset quality is sudden. The problem occurs constantly. The problem has been unchanged. The pain is located in the RLQ. The pain is at a severity of 7/10. The pain is moderate. The quality of the pain is sharp. The abdominal pain does not radiate. Pertinent negatives include no constipation or diarrhea. Nothing aggravates the pain. The pain is relieved by nothing. She has tried nothing for the symptoms.  Vaginal Bleeding  The patient's primary symptoms include vaginal bleeding. The patient's pertinent negatives include no genital itching, genital odor or pelvic pain. This is a new problem. The current episode started yesterday. The problem occurs 2 to 4 times per day. The problem has been unchanged. Associated symptoms include abdominal pain. Pertinent negatives include no constipation or diarrhea. The vaginal discharge was bloody. The vaginal bleeding is spotting. She has not been passing clots. She has not been passing tissue. Nothing aggravates the symptoms. She has tried nothing for the symptoms.    OB History    Gravida Para Term Preterm AB Living   10 7 7  0 2 7   SAB TAB Ectopic Multiple Live Births   0 2 0 0 7      Past Medical History:  Diagnosis Date  . Benign tumor of thyroid gland   . Fibroid   . Pericarditis 2011    Past Surgical History:  Procedure Laterality Date  . LAPAROSCOPIC TUBAL LIGATION Bilateral 10/12/2014   Procedure: LAPAROSCOPIC TUBAL LIGATION;  Surgeon: Woodroe Mode, MD;  Location: [facility];  Service: Gynecology;  Laterality: Bilateral;    Family History  Problem Relation Age of Onset  . Heart disease Father   . Heart attack Father   . Stroke Father   . Cancer Sister 40    ovarian  . Hypertension Mother   . Brain cancer Maternal Grandmother   . Heart attack Paternal Grandmother     Social History  Substance Use Topics  . Smoking status: Never Smoker  . Smokeless tobacco: Never Used  . Alcohol use No     Comment: occcasional    Allergies:  Allergies  Allergen Reactions  . Penicillins Anaphylaxis    Has patient had a PCN reaction causing immediate rash, facial/tongue/throat swelling, SOB or lightheadedness with hypotension: yes Has patient had a PCN reaction causing severe rash involving mucus membranes or skin necrosis: no Has patient had a PCN reaction that required hospitalization unknown Has patient had a PCN reaction occurring within the last 10 years: no If all of the above answers are "NO", then may proceed with Cephalosporin use.     Prescriptions Prior to Admission  Medication Sig Dispense Refill Last Dose  . Doxylamine-Pyridoxine 10-10 MG TBEC Take 2 tabs at bedtime. If symptoms persist after 2 days, take 1 tab in the morning and 2 tabs at bedtime (Patient not taking: Reported on 04/19/2016) 100 tablet 6 Not Taking  . Pediatric Multivit-Minerals-C (FLINTSTONES GUMMIES PO) Take by mouth.   Taking  .  Prenat w/o A Vit-FeFum-FePo-FA (CONCEPT OB) 130-92.4-1 MG CAPS Take 1 capsule by mouth daily. (Patient not taking: Reported on 05/10/2016) 30 capsule 10 Not Taking  . [DISCONTINUED] metroNIDAZOLE (FLAGYL) 500 MG tablet Take 1 tablet (500 mg total) by mouth 2 (two) times daily. (Patient not taking: Reported on 05/10/2016) 14 tablet 0 Not Taking  . [DISCONTINUED] terconazole (TERAZOL 7) 0.4 % vaginal cream Place 1 applicator vaginally at bedtime. (Patient not taking: Reported on 05/10/2016) 45 g 0 Not Taking    Review of Systems  Eyes: Negative.    Respiratory: Negative.   Gastrointestinal: Positive for abdominal pain. Negative for constipation and diarrhea.  Genitourinary: Positive for vaginal bleeding. Negative for pelvic pain.   Physical Exam   Blood pressure 121/62, pulse 67, temperature 98 F (36.7 C), temperature source Oral, resp. rate 18, last menstrual period 12/28/2015, currently breastfeeding.  Physical Exam  Constitutional: She is oriented to person, place, and time. She appears well-developed and well-nourished.  HENT:  Head: Normocephalic.  Neck: Normal range of motion.  Respiratory: Effort normal.  GI: Soft. She exhibits no distension and no mass. There is no tenderness. There is no rebound and no guarding.  Genitourinary:  Genitourinary Comments: NEFG; no lesions on vaginal walls. Clumpy white discharge adherant to the vaginal walls; cervix is pink with no lesions. No blood in the vagina, no CMT, no suprapubic tenderness, no adnexal tenderness.   Musculoskeletal: Normal range of motion.  Neurological: She is alert and oriented to person, place, and time. She has normal reflexes.  Skin: Skin is warm and dry.    MAU Course  Procedures  MDM -FHR at 135 bpm by Doppler -CBC and CMP -wet prep: positive for yeast and clue -gc ct pending -OB limited US  Patient care endorsed to [place], CNM at 2015.  Results for orders placed or performed during the hospital encounter of 06/05/16 (from the past 24 hour(s))  Urinalysis, Routine w reflex microscopic     Status: Abnormal   Collection Time: 06/05/16  5:55 PM  Result Value Ref Range   Color, Urine YELLOW YELLOW   APPearance HAZY (A) CLEAR   Specific Gravity, Urine 1.014 1.005 - 1.030   pH 7.0 5.0 - 8.0   Glucose, UA NEGATIVE NEGATIVE mg/dL   Hgb urine dipstick NEGATIVE NEGATIVE   Bilirubin Urine NEGATIVE NEGATIVE   Ketones, ur 5 (A) NEGATIVE mg/dL   Protein, ur NEGATIVE NEGATIVE mg/dL   Nitrite NEGATIVE NEGATIVE   Leukocytes, UA MODERATE (A) NEGATIVE   RBC /  HPF 0-5 0 - 5 RBC/hpf   WBC, UA 6-30 0 - 5 WBC/hpf   Bacteria, UA NONE SEEN NONE SEEN   Squamous Epithelial / LPF 6-30 (A) NONE SEEN   Mucous PRESENT   Wet prep, genital     Status: Abnormal   Collection Time: 06/05/16  6:22 PM  Result Value Ref Range   Yeast Wet Prep HPF POC PRESENT (A) NONE SEEN   Trich, Wet Prep NONE SEEN NONE SEEN   Clue Cells Wet Prep HPF POC PRESENT (A) NONE SEEN   WBC, Wet Prep HPF POC FEW (A) NONE SEEN   Sperm NONE SEEN   CBC     Status: Abnormal   Collection Time: 06/05/16  6:39 PM  Result Value Ref Range   WBC 6.8 4.0 - 10.5 K/uL   RBC 3.36 (L) 3.87 - 5.11 MIL/uL   Hemoglobin 10.5 (L) 12.0 - 15.0 g/dL   HCT 29.5 (L) 36.0 - 46.0 %  MCV 87.8 78.0 - 100.0 fL   MCH 31.3 26.0 - 34.0 pg   MCHC 35.6 30.0 - 36.0 g/dL   RDW 13.4 11.5 - 15.5 %   Platelets 201 150 - 400 K/uL  Comprehensive metabolic panel     Status: Abnormal   Collection Time: 06/05/16  6:39 PM  Result Value Ref Range   Sodium 136 135 - 145 mmol/L   Potassium 3.2 (L) 3.5 - 5.1 mmol/L   Chloride 106 101 - 111 mmol/L   CO2 25 22 - 32 mmol/L   Glucose, Bld 102 (H) 65 - 99 mg/dL   BUN 6 6 - 20 mg/dL   Creatinine, Ser 0.48 0.44 - 1.00 mg/dL   Calcium 9.5 8.9 - 10.3 mg/dL   Total Protein 6.5 6.5 - 8.1 g/dL   Albumin 3.1 (L) 3.5 - 5.0 g/dL   AST 13 (L) 15 - 41 U/L   ALT 9 (L) 14 - 54 U/L   Alkaline Phosphatase 52 38 - 126 U/L   Total Bilirubin 0.4 0.3 - 1.2 mg/dL   GFR calc non Af Amer >60 >60 mL/min   GFR calc Af Amer >60 >60 mL/min   Anion gap 5 5 - 15   [place]: CL 4.06 cm, placenta anterior above os.   No improvement w/ Flexeril. Exam repeated. Mild TTP from right iliac crest down to right groin. Neg rebound tenderness. Ibuprofen and heat ordered.   Feeling better.   MDM - Right groin pain C/W RLP. Low suspicion for appendicitis due to location of pain and absence of fever, leukocytosis and GI complaints. No evidence of PTL and cervical shortening. - Vaginal bleeding: No bleeding on  exam--possibly 2/2 friable cervix from VVC and BV. No evidence of PTL, previa, abruption.  Assessment and Plan   1. Vaginal bleeding   2. [redacted] weeks gestation of pregnancy   3. Pain of round ligament affecting pregnancy, antepartum   4. BV (bacterial vaginosis)   5. Yeast infection    D/C home in stable condition. Pelvic rest x 1 week Abd pain, appendicitis and bleeding precautions. IBU sparingly in second trimester. Comfort measures. Maternity belt.  OK to continue working.  Allergies as of 06/05/2016      Reactions   Penicillins Anaphylaxis   Has patient had a PCN reaction causing immediate rash, facial/tongue/throat swelling, SOB or lightheadedness with hypotension: yes Has patient had a PCN reaction causing severe rash involving mucus membranes or skin necrosis: no Has patient had a PCN reaction that required hospitalization unknown Has patient had a PCN reaction occurring within the last 10 years: no If all of the above answers are "NO", then may proceed with Cephalosporin use.      Medication List    TAKE these medications   CONCEPT OB 130-92.4-1 MG Caps Take 1 capsule by mouth daily.   Doxylamine-Pyridoxine 10-10 MG Tbec Take 2 tabs at bedtime. If symptoms persist after 2 days, take 1 tab in the morning and 2 tabs at bedtime   FLINTSTONES GUMMIES PO Take by mouth.   ibuprofen 600 MG tablet Commonly known as:  ADVIL,MOTRIN Take 1 tablet (600 mg total) by mouth every 6 (six) hours as needed. Do not take after 28 weeks.   metroNIDAZOLE 500 MG tablet Commonly known as:  FLAGYL Take 1 tablet (500 mg total) by mouth 2 (two) times daily.   terconazole 0.4 % vaginal cream Commonly known as:  TERAZOL 7 Place 1 applicator vaginally at bedtime.  Follow-up [place] for [place] Follow up on 06/18/2016.   Specialty:  Obstetrics and Gynecology Contact information: [place] [facility] [place] Follow up.   Why:  in emergencies Contact information: [address] [id number] [place] 06/05/2016, 10:33 PM

## 2016-06-05 NOTE — Discharge Instructions (Signed)
Round Ligament Pain During Pregnancy   Round ligament pain is a sharp pain or jabbing feeling often felt in the lower belly or groin area on one or both sides. It is one of the most common complaints during pregnancy and is considered a normal part of pregnancy. It is most often felt during the second trimester.   Here is what you need to know about round ligament pain, including some tips to help you feel better.   Causes of Round Ligament Pain   Several thick ligaments surround and support your womb (uterus) as it grows during pregnancy. One of them is called the round ligament.   The round ligament connects the front part of the womb to your groin, the area where your legs attach to your pelvis. The round ligament normally tightens and relaxes slowly.   As your baby and womb grow, the round ligament stretches. That makes it more likely to become strained.   Sudden movements can cause the ligament to tighten quickly, like a rubber band snapping. This causes a sudden and quick jabbing feeling.   Symptoms of Round Ligament Pain   Round ligament pain can be concerning and uncomfortable. But it is considered normal as your body changes during pregnancy.   The symptoms of round ligament pain include a sharp, sudden spasm in the belly. It usually affects the right side, but it may happen on both sides. The pain only lasts a few seconds.   Exercise may cause the pain, as will rapid movements such as:  sneezing  coughing  laughing  rolling over in bed  standing up too quickly   Treatment of Round Ligament Pain   Here are some tips that may help reduce your discomfort:   Pain relief. Take over-the-counter acetaminophen for pain, if necessary. Ask your doctor if this is OK.   Exercise. Get plenty of exercise to keep your stomach (core) muscles strong. Doing stretching exercises or prenatal yoga can be helpful. Ask your doctor which exercises are safe for you and your baby.   A helpful  exercise involves putting your hands and knees on the floor, lowering your head, and pushing your backside into the air.   Avoid sudden movements. Change positions slowly (such as standing up or sitting down) to avoid sudden movements that may cause stretching and pain.   Flex your hips. Bend and flex your hips before you cough, sneeze, or laugh to avoid pulling on the ligaments.   Apply warmth. A heating pad or warm bath may be helpful. Ask your doctor if this is OK. Extreme heat can be dangerous to the baby.   You should try to modify your daily activity level and avoid positions that may worsen the condition.   When to Call the Doctor/Midwife   Always tell your doctor or midwife about any type of pain you have during pregnancy. Round ligament pain is quick and doesn't last long.   Call your health care provider immediately if you have:  severe pain  fever  chills  pain on urination  difficulty walking   Belly pain during pregnancy can be due to many different causes. It is important for your doctor to rule out more serious conditions, including pregnancy complications such as placenta abruption or non-pregnancy illnesses such as:  inguinal hernia  appendicitis  stomach, liver, and kidney problems  Preterm labor pains may sometimes be mistaken for round ligament pain.     Vaginal Bleeding During Pregnancy, Second Trimester A small  amount of bleeding (spotting) from the vagina is relatively common in pregnancy. It usually stops on its own. Various things can cause bleeding or spotting in pregnancy. Some bleeding may be related to the pregnancy, and some may not. Sometimes the bleeding is normal and is not a problem. However, bleeding can also be a sign of something serious. Be sure to tell your health care provider about any vaginal bleeding right away. Some possible causes of vaginal bleeding during the second trimester include:  Infection, inflammation, or growths on the  cervix.  The placenta may be partially or completely covering the opening of the cervix inside the uterus (placenta previa).  The placenta may have separated from the uterus (abruption of the placenta).  You may be having early (preterm) labor.  The cervix may not be strong enough to keep a baby inside the uterus (cervical insufficiency).  Tiny cysts may have developed in the uterus instead of pregnancy tissue (molar pregnancy). Follow these instructions at home: Watch your condition for any changes. The following actions may help to lessen any discomfort you are feeling:  Follow your health care provider's instructions for limiting your activity. If your health care provider orders bed rest, you may need to stay in bed and only get up to use the bathroom. However, your health care provider may allow you to continue light activity.  If needed, make plans for someone to help with your regular activities and responsibilities while you are on bed rest.  Keep track of the number of pads you use each day, how often you change pads, and how soaked (saturated) they are. Write this down.  Do not use tampons. Do not douche.  Do not have sexual intercourse or orgasms until approved by your health care provider.  If you pass any tissue from your vagina, save the tissue so you can show it to your health care provider.  Only take over-the-counter or prescription medicines as directed by your health care provider.  Do not take aspirin because it can make you bleed.  Do not exercise or perform any strenuous activities or heavy lifting without your health care provider's permission.  Keep all follow-up appointments as directed by your health care provider. Contact a health care provider if:  You have any vaginal bleeding during any part of your pregnancy.  You have cramps or labor pains.  You have a fever, not controlled by medicine. Get help right away if:  You have severe cramps in your  back or belly (abdomen).  You have contractions.  You have chills.  You pass large clots or tissue from your vagina.  Your bleeding increases.  You feel light-headed or weak, or you have fainting episodes.  You are leaking fluid or have a gush of fluid from your vagina. This information is not intended to replace advice given to you by your health care provider. Make sure you discuss any questions you have with your health care provider. Document Released: 11/08/2004 Document Revised: 07/07/2015 Document Reviewed: 10/06/2012 Elsevier Interactive Patient Education  2017 [employer].   Pelvic Rest Pelvic rest may be recommended if:  Your placenta is partially or completely covering the opening of your cervix (placenta previa).  There is bleeding between the wall of the uterus and the amniotic sac in the first trimester of pregnancy (subchorionic hemorrhage).  You went into labor too early (preterm labor). Based on your overall health and the health of your baby, your health care provider will decide if pelvic  rest is right for you. How do I rest my pelvis? For as long as told by your health care provider:  Do not have sex, sexual stimulation, or an orgasm.  Do not use tampons. Do not douche. Do not put anything in your vagina.  Do not lift anything that is heavier than 10 lb (4.5 kg).  Avoid activities that take a lot of effort (are strenuous).  Avoid any activity in which your pelvic muscles could become strained. When should I seek medical care? Seek medical care if you have:  Cramping pain in your lower abdomen.  Vaginal discharge.  A low, dull backache.  Regular contractions.  Uterine tightening. When should I seek immediate medical care? Seek immediate medical care if:  You have vaginal bleeding and you are pregnant. This information is not intended to replace advice given to you by your health care provider. Make sure you discuss any questions you have  with your health care provider. Document Released: 05/26/2010 Document Revised: 07/07/2015 Document Reviewed: 08/02/2014 Elsevier Interactive Patient Education  2017 [employer].

## 2016-06-06 LAB — GC/CHLAMYDIA PROBE AMP (~~LOC~~) NOT AT ARMC
CHLAMYDIA, DNA PROBE: NEGATIVE
Neisseria Gonorrhea: NEGATIVE

## 2016-06-07 ENCOUNTER — Encounter (HOSPITAL_COMMUNITY): Payer: Self-pay

## 2016-06-07 ENCOUNTER — Ambulatory Visit (HOSPITAL_COMMUNITY)
Admission: RE | Admit: 2016-06-07 | Discharge: 2016-06-07 | Disposition: A | Discharge status: Home / Self Care | Payer: Medicaid Other | Source: Ambulatory Visit | Attending: Obstetrics & Gynecology | Admitting: Obstetrics & Gynecology

## 2016-06-07 DIAGNOSIS — O3412 Maternal care for benign tumor of corpus uteri, second trimester: Secondary | ICD-10-CM | POA: Insufficient documentation

## 2016-06-07 DIAGNOSIS — D259 Leiomyoma of uterus, unspecified: Secondary | ICD-10-CM | POA: Diagnosis not present

## 2016-06-07 DIAGNOSIS — Z3A23 23 weeks gestation of pregnancy: Secondary | ICD-10-CM | POA: Diagnosis not present

## 2016-06-07 DIAGNOSIS — O4692 Antepartum hemorrhage, unspecified, second trimester: Secondary | ICD-10-CM | POA: Insufficient documentation

## 2016-06-07 DIAGNOSIS — O99282 Endocrine, nutritional and metabolic diseases complicating pregnancy, second trimester: Secondary | ICD-10-CM | POA: Diagnosis not present

## 2016-06-07 DIAGNOSIS — E079 Disorder of thyroid, unspecified: Secondary | ICD-10-CM | POA: Insufficient documentation

## 2016-06-07 DIAGNOSIS — O0990 Supervision of high risk pregnancy, unspecified, unspecified trimester: Secondary | ICD-10-CM

## 2016-06-07 DIAGNOSIS — E010 Iodine-deficiency related diffuse (endemic) goiter: Secondary | ICD-10-CM

## 2016-06-14 ENCOUNTER — Other Ambulatory Visit: Payer: Self-pay | Admitting: Otolaryngology

## 2016-06-14 DIAGNOSIS — E041 Nontoxic single thyroid nodule: Secondary | ICD-10-CM

## 2016-06-18 ENCOUNTER — Other Ambulatory Visit: Payer: Self-pay | Admitting: Otolaryngology

## 2016-06-18 ENCOUNTER — Ambulatory Visit (INDEPENDENT_AMBULATORY_CARE_PROVIDER_SITE_OTHER): Payer: Medicaid Other | Admitting: Family Medicine

## 2016-06-18 VITALS — BP 117/67 | HR 68 | Wt 166.4 lb

## 2016-06-18 DIAGNOSIS — O0942 Supervision of pregnancy with grand multiparity, second trimester: Secondary | ICD-10-CM

## 2016-06-18 DIAGNOSIS — O0990 Supervision of high risk pregnancy, unspecified, unspecified trimester: Secondary | ICD-10-CM

## 2016-06-18 DIAGNOSIS — O0940 Supervision of pregnancy with grand multiparity, unspecified trimester: Secondary | ICD-10-CM | POA: Insufficient documentation

## 2016-06-18 DIAGNOSIS — E010 Iodine-deficiency related diffuse (endemic) goiter: Secondary | ICD-10-CM

## 2016-06-18 DIAGNOSIS — O0992 Supervision of high risk pregnancy, unspecified, second trimester: Secondary | ICD-10-CM

## 2016-06-18 DIAGNOSIS — E041 Nontoxic single thyroid nodule: Secondary | ICD-10-CM

## 2016-06-18 NOTE — Progress Notes (Signed)
   PRENATAL VISIT NOTE  Subjective:  Jennifer Dunlap is a 36 y.o. [id number] at [redacted]w[redacted]d being seen today for ongoing prenatal care.  She is currently monitored for the following issues for this high-risk pregnancy and has History of maternal pericarditis; Thyromegaly; History of bilateral tubal ligation; Supervision of high risk pregnancy, antepartum; and Grand multiparity with antenatal problem on her problem list.  Patient reports fatigue.  Contractions: Not present. Vag. Bleeding: None.  Movement: Present. Denies leaking of fluid.   The following portions of the patient's history were reviewed and updated as appropriate: allergies, current medications, past family history, past medical history, past social history, past surgical history and problem list. Problem list updated.  Objective:   Vitals:   06/18/16 1040  BP: 117/67  Pulse: 68  Weight: 166 lb 6.4 oz (75.5 kg)    Fetal Status: Fetal Heart Rate (bpm): 135 Fundal Height: 26 cm Movement: Present     General:  Alert, oriented and cooperative. Patient is in no acute distress.  Skin: Skin is warm and dry. No rash noted.   Cardiovascular: Normal heart rate noted  Respiratory: Normal respiratory effort, no problems with respiration noted  Abdomen: Soft, gravid, appropriate for gestational age. Pain/Pressure: Absent     Pelvic:  Cervical exam deferred        Extremities: Normal range of motion.  Edema: None  Mental Status: Normal mood and affect. Normal behavior. Normal judgment and thought content.   Assessment and Plan:  Pregnancy: [id number] at [redacted]w[redacted]d  1. [place]bes multiparity with antenatal problem in second trimester U/S for growth in the third trimester - [place] MFM OB FOLLOW UP; Future  2. Supervision of high risk pregnancy, antepartum Continue prenatal care. 28 wk labs next visit  3. Thyromegaly Needs u/s guided biopsy scheduled Has seen ENT--who want her to get this biopsied.   Preterm labor symptoms and general obstetric  precautions including but not limited to vaginal bleeding, contractions, leaking of fluid and fetal movement were reviewed in detail with the patient. Please refer to After Visit Summary for other counseling recommendations.  Return in 4 weeks (on 07/16/2016) for 28 wk labs.   Donnamae Jude, MD

## 2016-06-18 NOTE — Patient Instructions (Signed)
 Second Trimester of Pregnancy The second trimester is from week 14 through week 27 (months 4 through 6). The second trimester is often a time when you feel your best. Your body has adjusted to being pregnant, and you begin to feel better physically. Usually, morning sickness has lessened or quit completely, you may have more energy, and you may have an increase in appetite. The second trimester is also a time when the fetus is growing rapidly. At the end of the sixth month, the fetus is about 9 inches long and weighs about 1 pounds. You will likely begin to feel the baby move (quickening) between 16 and 20 weeks of pregnancy. Body changes during your second trimester Your body continues to go through many changes during your second trimester. The changes vary from woman to woman.  Your weight will continue to increase. You will notice your lower abdomen bulging out.  You may begin to get stretch marks on your hips, abdomen, and breasts.  You may develop headaches that can be relieved by medicines. The medicines should be approved by your health care provider.  You may urinate more often because the fetus is pressing on your bladder.  You may develop or continue to have heartburn as a result of your pregnancy.  You may develop constipation because certain hormones are causing the muscles that push waste through your intestines to slow down.  You may develop hemorrhoids or swollen, bulging veins (varicose veins).  You may have back pain. This is caused by: ? Weight gain. ? Pregnancy hormones that are relaxing the joints in your pelvis. ? A shift in weight and the muscles that support your balance.  Your breasts will continue to grow and they will continue to become tender.  Your gums may bleed and may be sensitive to brushing and flossing.  Dark spots or blotches (chloasma, mask of pregnancy) may develop on your face. This will likely fade after the baby is born.  A dark line from  your belly button to the pubic area (linea nigra) may appear. This will likely fade after the baby is born.  You may have changes in your hair. These can include thickening of your hair, rapid growth, and changes in texture. Some women also have hair loss during or after pregnancy, or hair that feels dry or thin. Your hair will most likely return to normal after your baby is born.  What to expect at prenatal visits During a routine prenatal visit:  You will be weighed to make sure you and the fetus are growing normally.  Your blood pressure will be taken.  Your abdomen will be measured to track your baby's growth.  The fetal heartbeat will be listened to.  Any test results from the previous visit will be discussed.  Your health care provider may ask you:  How you are feeling.  If you are feeling the baby move.  If you have had any abnormal symptoms, such as leaking fluid, bleeding, severe headaches, or abdominal cramping.  If you are using any tobacco products, including cigarettes, chewing tobacco, and electronic cigarettes.  If you have any questions.  Other tests that may be performed during your second trimester include:  Blood tests that check for: ? Low iron levels (anemia). ? High blood sugar that affects pregnant women (gestational diabetes) between 24 and 28 weeks. ? Rh antibodies. This is to check for a protein on red blood cells (Rh factor).  Urine tests to check for infections, diabetes,   or protein in the urine.  An ultrasound to confirm the proper growth and development of the baby.  An amniocentesis to check for possible genetic problems.  Fetal screens for spina bifida and Down syndrome.  HIV (human immunodeficiency virus) testing. Routine prenatal testing includes screening for HIV, unless you choose not to have this test.  Follow these instructions at home: Medicines  Follow your health care provider's instructions regarding medicine use. Specific  medicines may be either safe or unsafe to take during pregnancy.  Take a prenatal vitamin that contains at least 600 micrograms (mcg) of folic acid.  If you develop constipation, try taking a stool softener if your health care provider approves. Eating and drinking  Eat a balanced diet that includes fresh fruits and vegetables, whole grains, good sources of protein such as meat, eggs, or tofu, and low-fat dairy. Your health care provider will help you determine the amount of weight gain that is right for you.  Avoid raw meat and uncooked cheese. These carry germs that can cause birth defects in the baby.  If you have low calcium intake from food, talk to your health care provider about whether you should take a daily calcium supplement.  Limit foods that are high in fat and processed sugars, such as fried and sweet foods.  To prevent constipation: ? Drink enough fluid to keep your urine clear or pale yellow. ? Eat foods that are high in fiber, such as fresh fruits and vegetables, whole grains, and beans. Activity  Exercise only as directed by your health care provider. Most women can continue their usual exercise routine during pregnancy. Try to exercise for 30 minutes at least 5 days a week. Stop exercising if you experience uterine contractions.  Avoid heavy lifting, wear low heel shoes, and practice good posture.  A sexual relationship may be continued unless your health care provider directs you otherwise. Relieving pain and discomfort  Wear a good support bra to prevent discomfort from breast tenderness.  Take warm sitz baths to soothe any pain or discomfort caused by hemorrhoids. Use hemorrhoid cream if your health care provider approves.  Rest with your legs elevated if you have leg cramps or low back pain.  If you develop varicose veins, wear support hose. Elevate your feet for 15 minutes, 3-4 times a day. Limit salt in your diet. Prenatal Care  Write down your questions.  Take them to your prenatal visits.  Keep all your prenatal visits as told by your health care provider. This is important. Safety  Wear your seat belt at all times when driving.  Make a list of emergency phone numbers, including numbers for family, friends, the hospital, and police and fire departments. General instructions  Ask your health care provider for a referral to a local prenatal education class. Begin classes no later than the beginning of month 6 of your pregnancy.  Ask for help if you have counseling or nutritional needs during pregnancy. Your health care provider can offer advice or refer you to specialists for help with various needs.  Do not use hot tubs, steam rooms, or saunas.  Do not douche or use tampons or scented sanitary pads.  Do not cross your legs for long periods of time.  Avoid cat litter boxes and soil used by cats. These carry germs that can cause birth defects in the baby and possibly loss of the fetus by miscarriage or stillbirth.  Avoid all smoking, herbs, alcohol, and unprescribed drugs. Chemicals in these products   can affect the formation and growth of the baby.  Do not use any products that contain nicotine or tobacco, such as cigarettes and e-cigarettes. If you need help quitting, ask your health care provider.  Visit your dentist if you have not gone yet during your pregnancy. Use a soft toothbrush to brush your teeth and be gentle when you floss. Contact a health care provider if:  You have dizziness.  You have mild pelvic cramps, pelvic pressure, or nagging pain in the abdominal area.  You have persistent nausea, vomiting, or diarrhea.  You have a bad smelling vaginal discharge.  You have pain when you urinate. Get help right away if:  You have a fever.  You are leaking fluid from your vagina.  You have spotting or bleeding from your vagina.  You have severe abdominal cramping or pain.  You have rapid weight gain or weight  loss.  You have shortness of breath with chest pain.  You notice sudden or extreme swelling of your face, hands, ankles, feet, or legs.  You have not felt your baby move in over an hour.  You have severe headaches that do not go away when you take medicine.  You have vision changes. Summary  The second trimester is from week 14 through week 27 (months 4 through 6). It is also a time when the fetus is growing rapidly.  Your body goes through many changes during pregnancy. The changes vary from woman to woman.  Avoid all smoking, herbs, alcohol, and unprescribed drugs. These chemicals affect the formation and growth your baby.  Do not use any tobacco products, such as cigarettes, chewing tobacco, and e-cigarettes. If you need help quitting, ask your health care provider.  Contact your health care provider if you have any questions. Keep all prenatal visits as told by your health care provider. This is important. This information is not intended to replace advice given to you by your health care provider. Make sure you discuss any questions you have with your health care provider. Document Released: 01/23/2001 Document Revised: 07/07/2015 Document Reviewed: 04/01/2012 [employer] Interactive Patient Education  2017 [employer]   Breastfeeding Deciding to breastfeed is one of the best choices you can make for you and your baby. A change in hormones during pregnancy causes your breast tissue to grow and increases the number and size of your milk ducts. These hormones also allow proteins, sugars, and fats from your blood supply to make breast milk in your milk-producing glands. Hormones prevent breast milk from being released before your baby is born as well as prompt milk flow after birth. Once breastfeeding has begun, thoughts of your baby, as well as his or her sucking or crying, can stimulate the release of milk from your milk-producing glands. Benefits of breastfeeding For Your  Baby  Your first milk (colostrum) helps your baby's digestive system function better.  There are antibodies in your milk that help your baby fight off infections.  Your baby has a lower incidence of asthma, allergies, and sudden infant death syndrome.  The nutrients in breast milk are better for your baby than infant formulas and are designed uniquely for your baby's needs.  Breast milk improves your baby's brain development.  Your baby is less likely to develop other conditions, such as childhood obesity, asthma, or type 2 diabetes mellitus.  For You  Breastfeeding helps to create a very special bond between you and your baby.  Breastfeeding is convenient. Breast milk is always available at   the correct temperature and costs nothing.  Breastfeeding helps to burn calories and helps you lose the weight gained during pregnancy.  Breastfeeding makes your uterus contract to its prepregnancy size faster and slows bleeding (lochia) after you give birth.  Breastfeeding helps to lower your risk of developing type 2 diabetes mellitus, osteoporosis, and breast or ovarian cancer later in life.  Signs that your baby is hungry Early Signs of Hunger  Increased alertness or activity.  Stretching.  Movement of the head from side to side.  Movement of the head and opening of the mouth when the corner of the mouth or cheek is stroked (rooting).  Increased sucking sounds, smacking lips, cooing, sighing, or squeaking.  Hand-to-mouth movements.  Increased sucking of fingers or hands.  Late Signs of Hunger  Fussing.  Intermittent crying.  Extreme Signs of Hunger Signs of extreme hunger will require calming and consoling before your baby will be able to breastfeed successfully. Do not wait for the following signs of extreme hunger to occur before you initiate breastfeeding:  Restlessness.  A loud, strong cry.  Screaming.  Breastfeeding basics Breastfeeding Initiation  Find a  comfortable place to sit or lie down, with your neck and back well supported.  Place a pillow or rolled up blanket under your baby to bring him or her to the level of your breast (if you are seated). Nursing pillows are specially designed to help support your arms and your baby while you breastfeed.  Make sure that your baby's abdomen is facing your abdomen.  Gently massage your breast. With your fingertips, massage from your chest wall toward your nipple in a circular motion. This encourages milk flow. You may need to continue this action during the feeding if your milk flows slowly.  Support your breast with 4 fingers underneath and your thumb above your nipple. Make sure your fingers are well away from your nipple and your baby's mouth.  Stroke your baby's lips gently with your finger or nipple.  When your baby's mouth is open wide enough, quickly bring your baby to your breast, placing your entire nipple and as much of the colored area around your nipple (areola) as possible into your baby's mouth. ? More areola should be visible above your baby's upper lip than below the lower lip. ? Your baby's tongue should be between his or her lower gum and your breast.  Ensure that your baby's mouth is correctly positioned around your nipple (latched). Your baby's lips should create a seal on your breast and be turned out (everted).  It is common for your baby to suck about 2-3 minutes in order to start the flow of breast milk.  Latching Teaching your baby how to latch on to your breast properly is very important. An improper latch can cause nipple pain and decreased milk supply for you and poor weight gain in your baby. Also, if your baby is not latched onto your nipple properly, he or she may swallow some air during feeding. This can make your baby fussy. Burping your baby when you switch breasts during the feeding can help to get rid of the air. However, teaching your baby to latch on properly is  still the best way to prevent fussiness from swallowing air while breastfeeding. Signs that your baby has successfully latched on to your nipple:  Silent tugging or silent sucking, without causing you pain.  Swallowing heard between every 3-4 sucks.  Muscle movement above and in front of his or her   ears while sucking.  Signs that your baby has not successfully latched on to nipple:  Sucking sounds or smacking sounds from your baby while breastfeeding.  Nipple pain.  If you think your baby has not latched on correctly, slip your finger into the corner of your baby's mouth to break the suction and place it between your baby's gums. Attempt breastfeeding initiation again. Signs of Successful Breastfeeding Signs from your baby:  A gradual decrease in the number of sucks or complete cessation of sucking.  Falling asleep.  Relaxation of his or her body.  Retention of a small amount of milk in his or her mouth.  Letting go of your breast by himself or herself.  Signs from you:  Breasts that have increased in firmness, weight, and size 1-3 hours after feeding.  Breasts that are softer immediately after breastfeeding.  Increased milk volume, as well as a change in milk consistency and color by the fifth day of breastfeeding.  Nipples that are not sore, cracked, or bleeding.  Signs That Your Baby is Getting Enough Milk  Wetting at least 1-2 diapers during the first 24 hours after birth.  Wetting at least 5-6 diapers every 24 hours for the first week after birth. The urine should be clear or pale yellow by 5 days after birth.  Wetting 6-8 diapers every 24 hours as your baby continues to grow and develop.  At least 3 stools in a 24-hour period by age 5 days. The stool should be soft and yellow.  At least 3 stools in a 24-hour period by age 7 days. The stool should be seedy and yellow.  No loss of weight greater than 10% of birth weight during the first 3 days of age.  Average  weight gain of 4-7 ounces (113-198 g) per week after age 4 days.  Consistent daily weight gain by age 5 days, without weight loss after the age of 2 weeks.  After a feeding, your baby may spit up a small amount. This is common. Breastfeeding frequency and duration Frequent feeding will help you make more milk and can prevent sore nipples and breast engorgement. Breastfeed when you feel the need to reduce the fullness of your breasts or when your baby shows signs of hunger. This is called "breastfeeding on demand." Avoid introducing a pacifier to your baby while you are working to establish breastfeeding (the first 4-6 weeks after your baby is born). After this time you may choose to use a pacifier. Research has shown that pacifier use during the first year of a baby's life decreases the risk of sudden infant death syndrome (SIDS). Allow your baby to feed on each breast as long as he or she wants. Breastfeed until your baby is finished feeding. When your baby unlatches or falls asleep while feeding from the first breast, offer the second breast. Because newborns are often sleepy in the first few weeks of life, you may need to awaken your baby to get him or her to feed. Breastfeeding times will vary from baby to baby. However, the following rules can serve as a guide to help you ensure that your baby is properly fed:  Newborns (babies 4 weeks of age or younger) may breastfeed every 1-3 hours.  Newborns should not go longer than 3 hours during the day or 5 hours during the night without breastfeeding.  You should breastfeed your baby a minimum of 8 times in a 24-hour period until you begin to introduce solid foods to your   baby at around 6 months of age.  Breast milk pumping Pumping and storing breast milk allows you to ensure that your baby is exclusively fed your breast milk, even at times when you are unable to breastfeed. This is especially important if you are going back to work while you are still  breastfeeding or when you are not able to be present during feedings. Your lactation consultant can give you guidelines on how long it is safe to store breast milk. A breast pump is a machine that allows you to pump milk from your breast into a sterile bottle. The pumped breast milk can then be stored in a refrigerator or freezer. Some breast pumps are operated by hand, while others use electricity. Ask your lactation consultant which type will work best for you. Breast pumps can be purchased, but some hospitals and breastfeeding support groups lease breast pumps on a monthly basis. A lactation consultant can teach you how to hand express breast milk, if you prefer not to use a pump. Caring for your breasts while you breastfeed Nipples can become dry, cracked, and sore while breastfeeding. The following recommendations can help keep your breasts moisturized and healthy:  Avoid using soap on your nipples.  Wear a supportive bra. Although not required, special nursing bras and tank tops are designed to allow access to your breasts for breastfeeding without taking off your entire bra or top. Avoid wearing underwire-style bras or extremely tight bras.  Air dry your nipples for 3-4minutes after each feeding.  Use only cotton bra pads to absorb leaked breast milk. Leaking of breast milk between feedings is normal.  Use lanolin on your nipples after breastfeeding. Lanolin helps to maintain your skin's normal moisture barrier. If you use pure lanolin, you do not need to wash it off before feeding your baby again. Pure lanolin is not toxic to your baby. You may also hand express a few drops of breast milk and gently massage that milk into your nipples and allow the milk to air dry.  In the first few weeks after giving birth, some women experience extremely full breasts (engorgement). Engorgement can make your breasts feel heavy, warm, and tender to the touch. Engorgement peaks within 3-5 days after you give  birth. The following recommendations can help ease engorgement:  Completely empty your breasts while breastfeeding or pumping. You may want to start by applying warm, moist heat (in the shower or with warm water-soaked hand towels) just before feeding or pumping. This increases circulation and helps the milk flow. If your baby does not completely empty your breasts while breastfeeding, pump any extra milk after he or she is finished.  Wear a snug bra (nursing or regular) or tank top for 1-2 days to signal your body to slightly decrease milk production.  Apply ice packs to your breasts, unless this is too uncomfortable for you.  Make sure that your baby is latched on and positioned properly while breastfeeding.  If engorgement persists after 48 hours of following these recommendations, contact your health care provider or a lactation consultant. Overall health care recommendations while breastfeeding  Eat healthy foods. Alternate between meals and snacks, eating 3 of each per day. Because what you eat affects your breast milk, some of the foods may make your baby more irritable than usual. Avoid eating these foods if you are sure that they are negatively affecting your baby.  Drink milk, fruit juice, and water to satisfy your thirst (about 10 glasses a day).    Rest often, relax, and continue to take your prenatal vitamins to prevent fatigue, stress, and anemia.  Continue breast self-awareness checks.  Avoid chewing and smoking tobacco. Chemicals from cigarettes that pass into breast milk and exposure to secondhand smoke may harm your baby.  Avoid alcohol and drug use, including marijuana. Some medicines that may be harmful to your baby can pass through breast milk. It is important to ask your health care provider before taking any medicine, including all over-the-counter and prescription medicine as well as vitamin and herbal supplements. It is possible to become pregnant while breastfeeding.  If birth control is desired, ask your health care provider about options that will be safe for your baby. Contact a health care provider if:  You feel like you want to stop breastfeeding or have become frustrated with breastfeeding.  You have painful breasts or nipples.  Your nipples are cracked or bleeding.  Your breasts are red, tender, or warm.  You have a swollen area on either breast.  You have a fever or chills.  You have nausea or vomiting.  You have drainage other than breast milk from your nipples.  Your breasts do not become full before feedings by the fifth day after you give birth.  You feel sad and depressed.  Your baby is too sleepy to eat well.  Your baby is having trouble sleeping.  Your baby is wetting less than 3 diapers in a 24-hour period.  Your baby has less than 3 stools in a 24-hour period.  Your baby's skin or the white part of his or her eyes becomes yellow.  Your baby is not gaining weight by 5 days of age. Get help right away if:  Your baby is overly tired (lethargic) and does not want to wake up and feed.  Your baby develops an unexplained fever. This information is not intended to replace advice given to you by your health care provider. Make sure you discuss any questions you have with your health care provider. Document Released: 01/29/2005 Document Revised: 07/13/2015 Document Reviewed: 07/23/2012 [employer] Interactive Patient Education  2017 [employer]  

## 2016-06-18 NOTE — Progress Notes (Signed)
Breastfeeding tip reviewed Called [facility] to schedule U/S thyroid biopsy. The scheduler will call patient to schedule.

## 2016-06-25 ENCOUNTER — Ambulatory Visit (HOSPITAL_COMMUNITY)
Admission: RE | Admit: 2016-06-25 | Discharge: 2016-06-25 | Disposition: A | Discharge status: Home / Self Care | Payer: Medicaid Other | Source: Ambulatory Visit | Attending: Family Medicine | Admitting: Family Medicine

## 2016-06-25 DIAGNOSIS — Z3A25 25 weeks gestation of pregnancy: Secondary | ICD-10-CM | POA: Insufficient documentation

## 2016-06-25 DIAGNOSIS — D259 Leiomyoma of uterus, unspecified: Secondary | ICD-10-CM | POA: Diagnosis not present

## 2016-06-25 DIAGNOSIS — O0942 Supervision of pregnancy with grand multiparity, second trimester: Secondary | ICD-10-CM | POA: Diagnosis present

## 2016-06-25 DIAGNOSIS — O99282 Endocrine, nutritional and metabolic diseases complicating pregnancy, second trimester: Secondary | ICD-10-CM | POA: Insufficient documentation

## 2016-06-25 DIAGNOSIS — E079 Disorder of thyroid, unspecified: Secondary | ICD-10-CM | POA: Insufficient documentation

## 2016-06-25 DIAGNOSIS — O3412 Maternal care for benign tumor of corpus uteri, second trimester: Secondary | ICD-10-CM | POA: Diagnosis not present

## 2016-06-27 ENCOUNTER — Ambulatory Visit
Admission: RE | Admit: 2016-06-27 | Discharge: 2016-06-27 | Disposition: A | Discharge status: Home / Self Care | Payer: Medicaid Other | Source: Ambulatory Visit | Attending: Otolaryngology | Admitting: Otolaryngology

## 2016-06-27 ENCOUNTER — Other Ambulatory Visit (HOSPITAL_COMMUNITY)
Admission: RE | Admit: 2016-06-27 | Discharge: 2016-06-27 | Disposition: A | Discharge status: Home / Self Care | Payer: Medicaid Other | Source: Ambulatory Visit | Attending: Radiology | Admitting: Radiology

## 2016-06-27 ENCOUNTER — Other Ambulatory Visit: Payer: Medicaid Other

## 2016-06-27 DIAGNOSIS — E041 Nontoxic single thyroid nodule: Secondary | ICD-10-CM | POA: Diagnosis present

## 2016-07-16 ENCOUNTER — Ambulatory Visit (INDEPENDENT_AMBULATORY_CARE_PROVIDER_SITE_OTHER): Payer: Medicaid Other | Admitting: Family Medicine

## 2016-07-16 VITALS — BP 116/59 | HR 63 | Wt 167.1 lb

## 2016-07-16 DIAGNOSIS — O0990 Supervision of high risk pregnancy, unspecified, unspecified trimester: Secondary | ICD-10-CM

## 2016-07-16 DIAGNOSIS — O0993 Supervision of high risk pregnancy, unspecified, third trimester: Secondary | ICD-10-CM | POA: Diagnosis not present

## 2016-07-16 DIAGNOSIS — O0943 Supervision of pregnancy with grand multiparity, third trimester: Secondary | ICD-10-CM

## 2016-07-16 DIAGNOSIS — Z23 Encounter for immunization: Secondary | ICD-10-CM | POA: Diagnosis not present

## 2016-07-16 DIAGNOSIS — E010 Iodine-deficiency related diffuse (endemic) goiter: Secondary | ICD-10-CM | POA: Diagnosis not present

## 2016-07-16 NOTE — Patient Instructions (Signed)
 Third Trimester of Pregnancy The third trimester is from week 28 through week 40 (months 7 through 9). The third trimester is a time when the unborn baby (fetus) is growing rapidly. At the end of the ninth month, the fetus is about 20 inches in length and weighs 6-10 pounds. Body changes during your third trimester Your body will continue to go through many changes during pregnancy. The changes vary from woman to woman. During the third trimester:  Your weight will continue to increase. You can expect to gain 25-35 pounds (11-16 kg) by the end of the pregnancy.  You may begin to get stretch marks on your hips, abdomen, and breasts.  You may urinate more often because the fetus is moving lower into your pelvis and pressing on your bladder.  You may develop or continue to have heartburn. This is caused by increased hormones that slow down muscles in the digestive tract.  You may develop or continue to have constipation because increased hormones slow digestion and cause the muscles that push waste through your intestines to relax.  You may develop hemorrhoids. These are swollen veins (varicose veins) in the rectum that can itch or be painful.  You may develop swollen, bulging veins (varicose veins) in your legs.  You may have increased body aches in the pelvis, back, or thighs. This is due to weight gain and increased hormones that are relaxing your joints.  You may have changes in your hair. These can include thickening of your hair, rapid growth, and changes in texture. Some women also have hair loss during or after pregnancy, or hair that feels dry or thin. Your hair will most likely return to normal after your baby is born.  Your breasts will continue to grow and they will continue to become tender. A yellow fluid (colostrum) may leak from your breasts. This is the first milk you are producing for your baby.  Your belly button may stick out.  You may notice more swelling in your  hands, face, or ankles.  You may have increased tingling or numbness in your hands, arms, and legs. The skin on your belly may also feel numb.  You may feel short of breath because of your expanding uterus.  You may have more problems sleeping. This can be caused by the size of your belly, increased need to urinate, and an increase in your body's metabolism.  You may notice the fetus "dropping," or moving lower in your abdomen (lightening).  You may have increased vaginal discharge.  You may notice your joints feel loose and you may have pain around your pelvic bone.  What to expect at prenatal visits You will have prenatal exams every 2 weeks until week 36. Then you will have weekly prenatal exams. During a routine prenatal visit:  You will be weighed to make sure you and the baby are growing normally.  Your blood pressure will be taken.  Your abdomen will be measured to track your baby's growth.  The fetal heartbeat will be listened to.  Any test results from the previous visit will be discussed.  You may have a cervical check near your due date to see if your cervix has softened or thinned (effaced).  You will be tested for Group B streptococcus. This happens between 35 and 37 weeks.  Your health care provider may ask you:  What your birth plan is.  How you are feeling.  If you are feeling the baby move.  If you have   had any abnormal symptoms, such as leaking fluid, bleeding, severe headaches, or abdominal cramping.  If you are using any tobacco products, including cigarettes, chewing tobacco, and electronic cigarettes.  If you have any questions.  Other tests or screenings that may be performed during your third trimester include:  Blood tests that check for low iron levels (anemia).  Fetal testing to check the health, activity level, and growth of the fetus. Testing is done if you have certain medical conditions or if there are problems during the  pregnancy.  Nonstress test (NST). This test checks the health of your baby to make sure there are no signs of problems, such as the baby not getting enough oxygen. During this test, a belt is placed around your belly. The baby is made to move, and its heart rate is monitored during movement.  What is false labor? False labor is a condition in which you feel small, irregular tightenings of the muscles in the womb (contractions) that usually go away with rest, changing position, or drinking water. These are called Braxton Hicks contractions. Contractions may last for hours, days, or even weeks before true labor sets in. If contractions come at regular intervals, become more frequent, increase in intensity, or become painful, you should see your health care provider. What are the signs of labor?  Abdominal cramps.  Regular contractions that start at 10 minutes apart and become stronger and more frequent with time.  Contractions that start on the top of the uterus and spread down to the lower abdomen and back.  Increased pelvic pressure and dull back pain.  A watery or bloody mucus discharge that comes from the vagina.  Leaking of amniotic fluid. This is also known as your "water breaking." It could be a slow trickle or a gush. Let your health care provider know if it has a color or strange odor. If you have any of these signs, call your health care provider right away, even if it is before your due date. Follow these instructions at home: Medicines  Follow your health care provider's instructions regarding medicine use. Specific medicines may be either safe or unsafe to take during pregnancy.  Take a prenatal vitamin that contains at least 600 micrograms (mcg) of folic acid.  If you develop constipation, try taking a stool softener if your health care provider approves. Eating and drinking  Eat a balanced diet that includes fresh fruits and vegetables, whole grains, good sources of protein  such as meat, eggs, or tofu, and low-fat dairy. Your health care provider will help you determine the amount of weight gain that is right for you.  Avoid raw meat and uncooked cheese. These carry germs that can cause birth defects in the baby.  If you have low calcium intake from food, talk to your health care provider about whether you should take a daily calcium supplement.  Eat four or five small meals rather than three large meals a day.  Limit foods that are high in fat and processed sugars, such as fried and sweet foods.  To prevent constipation: ? Drink enough fluid to keep your urine clear or pale yellow. ? Eat foods that are high in fiber, such as fresh fruits and vegetables, whole grains, and beans. Activity  Exercise only as directed by your health care provider. Most women can continue their usual exercise routine during pregnancy. Try to exercise for 30 minutes at least 5 days a week. Stop exercising if you experience uterine contractions.  Avoid   heavy lifting.  Do not exercise in extreme heat or humidity, or at high altitudes.  Wear low-heel, comfortable shoes.  Practice good posture.  You may continue to have sex unless your health care provider tells you otherwise. Relieving pain and discomfort  Take frequent breaks and rest with your legs elevated if you have leg cramps or low back pain.  Take warm sitz baths to soothe any pain or discomfort caused by hemorrhoids. Use hemorrhoid cream if your health care provider approves.  Wear a good support bra to prevent discomfort from breast tenderness.  If you develop varicose veins: ? Wear support pantyhose or compression stockings as told by your healthcare provider. ? Elevate your feet for 15 minutes, 3-4 times a day. Prenatal care  Write down your questions. Take them to your prenatal visits.  Keep all your prenatal visits as told by your health care provider. This is important. Safety  Wear your seat belt at  all times when driving.  Make a list of emergency phone numbers, including numbers for family, friends, the hospital, and police and fire departments. General instructions  Avoid cat litter boxes and soil used by cats. These carry germs that can cause birth defects in the baby. If you have a cat, ask someone to clean the litter box for you.  Do not travel far distances unless it is absolutely necessary and only with the approval of your health care provider.  Do not use hot tubs, steam rooms, or saunas.  Do not drink alcohol.  Do not use any products that contain nicotine or tobacco, such as cigarettes and e-cigarettes. If you need help quitting, ask your health care provider.  Do not use any medicinal herbs or unprescribed drugs. These chemicals affect the formation and growth of the baby.  Do not douche or use tampons or scented sanitary pads.  Do not cross your legs for long periods of time.  To prepare for the arrival of your baby: ? Take prenatal classes to understand, practice, and ask questions about labor and delivery. ? Make a trial run to the hospital. ? Visit the hospital and tour the maternity area. ? Arrange for maternity or paternity leave through employers. ? Arrange for family and friends to take care of pets while you are in the hospital. ? Purchase a rear-facing car seat and make sure you know how to install it in your car. ? Pack your hospital bag. ? Prepare the baby's nursery. Make sure to remove all pillows and stuffed animals from the baby's crib to prevent suffocation.  Visit your dentist if you have not gone during your pregnancy. Use a soft toothbrush to brush your teeth and be gentle when you floss. Contact a health care provider if:  You are unsure if you are in labor or if your water has broken.  You become dizzy.  You have mild pelvic cramps, pelvic pressure, or nagging pain in your abdominal area.  You have lower back pain.  You have persistent  nausea, vomiting, or diarrhea.  You have an unusual or bad smelling vaginal discharge.  You have pain when you urinate. Get help right away if:  Your water breaks before 37 weeks.  You have regular contractions less than 5 minutes apart before 37 weeks.  You have a fever.  You are leaking fluid from your vagina.  You have spotting or bleeding from your vagina.  You have severe abdominal pain or cramping.  You have rapid weight loss or weight   gain.  You have shortness of breath with chest pain.  You notice sudden or extreme swelling of your face, hands, ankles, feet, or legs.  Your baby makes fewer than 10 movements in 2 hours.  You have severe headaches that do not go away when you take medicine.  You have vision changes. Summary  The third trimester is from week 28 through week 40, months 7 through 9. The third trimester is a time when the unborn baby (fetus) is growing rapidly.  During the third trimester, your discomfort may increase as you and your baby continue to gain weight. You may have abdominal, leg, and back pain, sleeping problems, and an increased need to urinate.  During the third trimester your breasts will keep growing and they will continue to become tender. A yellow fluid (colostrum) may leak from your breasts. This is the first milk you are producing for your baby.  False labor is a condition in which you feel small, irregular tightenings of the muscles in the womb (contractions) that eventually go away. These are called Braxton Hicks contractions. Contractions may last for hours, days, or even weeks before true labor sets in.  Signs of labor can include: abdominal cramps; regular contractions that start at 10 minutes apart and become stronger and more frequent with time; watery or bloody mucus discharge that comes from the vagina; increased pelvic pressure and dull back pain; and leaking of amniotic fluid. This information is not intended to replace advice  given to you by your health care provider. Make sure you discuss any questions you have with your health care provider. Document Released: 01/23/2001 Document Revised: 07/07/2015 Document Reviewed: 04/01/2012 [employer] Interactive Patient Education  2017 [employer]   Breastfeeding Deciding to breastfeed is one of the best choices you can make for you and your baby. A change in hormones during pregnancy causes your breast tissue to grow and increases the number and size of your milk ducts. These hormones also allow proteins, sugars, and fats from your blood supply to make breast milk in your milk-producing glands. Hormones prevent breast milk from being released before your baby is born as well as prompt milk flow after birth. Once breastfeeding has begun, thoughts of your baby, as well as his or her sucking or crying, can stimulate the release of milk from your milk-producing glands. Benefits of breastfeeding For Your Baby  Your first milk (colostrum) helps your baby's digestive system function better.  There are antibodies in your milk that help your baby fight off infections.  Your baby has a lower incidence of asthma, allergies, and sudden infant death syndrome.  The nutrients in breast milk are better for your baby than infant formulas and are designed uniquely for your baby's needs.  Breast milk improves your baby's brain development.  Your baby is less likely to develop other conditions, such as childhood obesity, asthma, or type 2 diabetes mellitus.  For You  Breastfeeding helps to create a very special bond between you and your baby.  Breastfeeding is convenient. Breast milk is always available at the correct temperature and costs nothing.  Breastfeeding helps to burn calories and helps you lose the weight gained during pregnancy.  Breastfeeding makes your uterus contract to its prepregnancy size faster and slows bleeding (lochia) after you give birth.  Breastfeeding helps  to lower your risk of developing type 2 diabetes mellitus, osteoporosis, and breast or ovarian cancer later in life.  Signs that your baby is hungry Early Signs of Hunger    Increased alertness or activity.  Stretching.  Movement of the head from side to side.  Movement of the head and opening of the mouth when the corner of the mouth or cheek is stroked (rooting).  Increased sucking sounds, smacking lips, cooing, sighing, or squeaking.  Hand-to-mouth movements.  Increased sucking of fingers or hands.  Late Signs of Hunger  Fussing.  Intermittent crying.  Extreme Signs of Hunger Signs of extreme hunger will require calming and consoling before your baby will be able to breastfeed successfully. Do not wait for the following signs of extreme hunger to occur before you initiate breastfeeding:  Restlessness.  A loud, strong cry.  Screaming.  Breastfeeding basics Breastfeeding Initiation  Find a comfortable place to sit or lie down, with your neck and back well supported.  Place a pillow or rolled up blanket under your baby to bring him or her to the level of your breast (if you are seated). Nursing pillows are specially designed to help support your arms and your baby while you breastfeed.  Make sure that your baby's abdomen is facing your abdomen.  Gently massage your breast. With your fingertips, massage from your chest wall toward your nipple in a circular motion. This encourages milk flow. You may need to continue this action during the feeding if your milk flows slowly.  Support your breast with 4 fingers underneath and your thumb above your nipple. Make sure your fingers are well away from your nipple and your baby's mouth.  Stroke your baby's lips gently with your finger or nipple.  When your baby's mouth is open wide enough, quickly bring your baby to your breast, placing your entire nipple and as much of the colored area around your nipple (areola) as possible into  your baby's mouth. ? More areola should be visible above your baby's upper lip than below the lower lip. ? Your baby's tongue should be between his or her lower gum and your breast.  Ensure that your baby's mouth is correctly positioned around your nipple (latched). Your baby's lips should create a seal on your breast and be turned out (everted).  It is common for your baby to suck about 2-3 minutes in order to start the flow of breast milk.  Latching Teaching your baby how to latch on to your breast properly is very important. An improper latch can cause nipple pain and decreased milk supply for you and poor weight gain in your baby. Also, if your baby is not latched onto your nipple properly, he or she may swallow some air during feeding. This can make your baby fussy. Burping your baby when you switch breasts during the feeding can help to get rid of the air. However, teaching your baby to latch on properly is still the best way to prevent fussiness from swallowing air while breastfeeding. Signs that your baby has successfully latched on to your nipple:  Silent tugging or silent sucking, without causing you pain.  Swallowing heard between every 3-4 sucks.  Muscle movement above and in front of his or her ears while sucking.  Signs that your baby has not successfully latched on to nipple:  Sucking sounds or smacking sounds from your baby while breastfeeding.  Nipple pain.  If you think your baby has not latched on correctly, slip your finger into the corner of your baby's mouth to break the suction and place it between your baby's gums. Attempt breastfeeding initiation again. Signs of Successful Breastfeeding Signs from your baby:  A   gradual decrease in the number of sucks or complete cessation of sucking.  Falling asleep.  Relaxation of his or her body.  Retention of a small amount of milk in his or her mouth.  Letting go of your breast by himself or herself.  Signs from  you:  Breasts that have increased in firmness, weight, and size 1-3 hours after feeding.  Breasts that are softer immediately after breastfeeding.  Increased milk volume, as well as a change in milk consistency and color by the fifth day of breastfeeding.  Nipples that are not sore, cracked, or bleeding.  Signs That Your Baby is Getting Enough Milk  Wetting at least 1-2 diapers during the first 24 hours after birth.  Wetting at least 5-6 diapers every 24 hours for the first week after birth. The urine should be clear or pale yellow by 5 days after birth.  Wetting 6-8 diapers every 24 hours as your baby continues to grow and develop.  At least 3 stools in a 24-hour period by age 5 days. The stool should be soft and yellow.  At least 3 stools in a 24-hour period by age 7 days. The stool should be seedy and yellow.  No loss of weight greater than 10% of birth weight during the first 3 days of age.  Average weight gain of 4-7 ounces (113-198 g) per week after age 4 days.  Consistent daily weight gain by age 5 days, without weight loss after the age of 2 weeks.  After a feeding, your baby may spit up a small amount. This is common. Breastfeeding frequency and duration Frequent feeding will help you make more milk and can prevent sore nipples and breast engorgement. Breastfeed when you feel the need to reduce the fullness of your breasts or when your baby shows signs of hunger. This is called "breastfeeding on demand." Avoid introducing a pacifier to your baby while you are working to establish breastfeeding (the first 4-6 weeks after your baby is born). After this time you may choose to use a pacifier. Research has shown that pacifier use during the first year of a baby's life decreases the risk of sudden infant death syndrome (SIDS). Allow your baby to feed on each breast as long as he or she wants. Breastfeed until your baby is finished feeding. When your baby unlatches or falls asleep  while feeding from the first breast, offer the second breast. Because newborns are often sleepy in the first few weeks of life, you may need to awaken your baby to get him or her to feed. Breastfeeding times will vary from baby to baby. However, the following rules can serve as a guide to help you ensure that your baby is properly fed:  Newborns (babies 4 weeks of age or younger) may breastfeed every 1-3 hours.  Newborns should not go longer than 3 hours during the day or 5 hours during the night without breastfeeding.  You should breastfeed your baby a minimum of 8 times in a 24-hour period until you begin to introduce solid foods to your baby at around 6 months of age.  Breast milk pumping Pumping and storing breast milk allows you to ensure that your baby is exclusively fed your breast milk, even at times when you are unable to breastfeed. This is especially important if you are going back to work while you are still breastfeeding or when you are not able to be present during feedings. Your lactation consultant can give you guidelines on how   long it is safe to store breast milk. A breast pump is a machine that allows you to pump milk from your breast into a sterile bottle. The pumped breast milk can then be stored in a refrigerator or freezer. Some breast pumps are operated by hand, while others use electricity. Ask your lactation consultant which type will work best for you. Breast pumps can be purchased, but some hospitals and breastfeeding support groups lease breast pumps on a monthly basis. A lactation consultant can teach you how to hand express breast milk, if you prefer not to use a pump. Caring for your breasts while you breastfeed Nipples can become dry, cracked, and sore while breastfeeding. The following recommendations can help keep your breasts moisturized and healthy:  Avoid using soap on your nipples.  Wear a supportive bra. Although not required, special nursing bras and tank  tops are designed to allow access to your breasts for breastfeeding without taking off your entire bra or top. Avoid wearing underwire-style bras or extremely tight bras.  Air dry your nipples for 3-4minutes after each feeding.  Use only cotton bra pads to absorb leaked breast milk. Leaking of breast milk between feedings is normal.  Use lanolin on your nipples after breastfeeding. Lanolin helps to maintain your skin's normal moisture barrier. If you use pure lanolin, you do not need to wash it off before feeding your baby again. Pure lanolin is not toxic to your baby. You may also hand express a few drops of breast milk and gently massage that milk into your nipples and allow the milk to air dry.  In the first few weeks after giving birth, some women experience extremely full breasts (engorgement). Engorgement can make your breasts feel heavy, warm, and tender to the touch. Engorgement peaks within 3-5 days after you give birth. The following recommendations can help ease engorgement:  Completely empty your breasts while breastfeeding or pumping. You may want to start by applying warm, moist heat (in the shower or with warm water-soaked hand towels) just before feeding or pumping. This increases circulation and helps the milk flow. If your baby does not completely empty your breasts while breastfeeding, pump any extra milk after he or she is finished.  Wear a snug bra (nursing or regular) or tank top for 1-2 days to signal your body to slightly decrease milk production.  Apply ice packs to your breasts, unless this is too uncomfortable for you.  Make sure that your baby is latched on and positioned properly while breastfeeding.  If engorgement persists after 48 hours of following these recommendations, contact your health care provider or a lactation consultant. Overall health care recommendations while breastfeeding  Eat healthy foods. Alternate between meals and snacks, eating 3 of each per  day. Because what you eat affects your breast milk, some of the foods may make your baby more irritable than usual. Avoid eating these foods if you are sure that they are negatively affecting your baby.  Drink milk, fruit juice, and water to satisfy your thirst (about 10 glasses a day).  Rest often, relax, and continue to take your prenatal vitamins to prevent fatigue, stress, and anemia.  Continue breast self-awareness checks.  Avoid chewing and smoking tobacco. Chemicals from cigarettes that pass into breast milk and exposure to secondhand smoke may harm your baby.  Avoid alcohol and drug use, including marijuana. Some medicines that may be harmful to your baby can pass through breast milk. It is important to ask your health care   provider before taking any medicine, including all over-the-counter and prescription medicine as well as vitamin and herbal supplements. It is possible to become pregnant while breastfeeding. If birth control is desired, ask your health care provider about options that will be safe for your baby. Contact a health care provider if:  You feel like you want to stop breastfeeding or have become frustrated with breastfeeding.  You have painful breasts or nipples.  Your nipples are cracked or bleeding.  Your breasts are red, tender, or warm.  You have a swollen area on either breast.  You have a fever or chills.  You have nausea or vomiting.  You have drainage other than breast milk from your nipples.  Your breasts do not become full before feedings by the fifth day after you give birth.  You feel sad and depressed.  Your baby is too sleepy to eat well.  Your baby is having trouble sleeping.  Your baby is wetting less than 3 diapers in a 24-hour period.  Your baby has less than 3 stools in a 24-hour period.  Your baby's skin or the white part of his or her eyes becomes yellow.  Your baby is not gaining weight by 5 days of age. Get help right away  if:  Your baby is overly tired (lethargic) and does not want to wake up and feed.  Your baby develops an unexplained fever. This information is not intended to replace advice given to you by your health care provider. Make sure you discuss any questions you have with your health care provider. Document Released: 01/29/2005 Document Revised: 07/13/2015 Document Reviewed: 07/23/2012 Elsevier Interactive Patient Education  2017 [employer]  

## 2016-07-16 NOTE — Progress Notes (Signed)
   PRENATAL VISIT NOTE  Subjective:  Jennifer Dunlap is a 36 y.o. [id number] at [redacted]w[redacted]d being seen today for ongoing prenatal care.  She is currently monitored for the following issues for this high-risk pregnancy and has History of maternal pericarditis; Thyromegaly; History of bilateral tubal ligation; Supervision of high risk pregnancy, antepartum; and [place] multiparity with antenatal problem on her problem list.  Patient reports no complaints.  Contractions: Irritability. Vag. Bleeding: None.  Movement: Present. Denies leaking of fluid.   The following portions of the patient's history were reviewed and updated as appropriate: allergies, current medications, past family history, past medical history, past social history, past surgical history and problem list. Problem list updated.  Objective:   Vitals:   07/16/16 0751  BP: (!) 116/59  Pulse: 63  Weight: 167 lb 1.6 oz (75.8 kg)    Fetal Status: Fetal Heart Rate (bpm): 134 Fundal Height: 29 cm Movement: Present     General:  Alert, oriented and cooperative. Patient is in no acute distress.  Skin: Skin is warm and dry. No rash noted.   Cardiovascular: Normal heart rate noted  Respiratory: Normal respiratory effort, no problems with respiration noted  Abdomen: Soft, gravid, appropriate for gestational age. Pain/Pressure: Present     Pelvic:  Cervical exam deferred        Extremities: Normal range of motion.  Edema: None  Mental Status: Normal mood and affect. Normal behavior. Normal judgment and thought content.   Assessment and Plan:  Pregnancy: [id number] at [redacted]w[redacted]d  1. Supervision of high risk pregnancy, antepartum 28 wk labs today - Tdap vaccine greater than or equal to 7yo IM - CBC - RPR - HIV antibody (with reflex) - Glucose Tolerance, 2 Hours w/1 Hour  2. [place] multiparity with antenatal problem in third trimester  3. Thyromegaly Patient with non-diagnostic biopsy--awaiting further testing and has ENT and will need follow-up  following this.  Preterm labor symptoms and general obstetric precautions including but not limited to vaginal bleeding, contractions, leaking of fluid and fetal movement were reviewed in detail with the patient. Please refer to After Visit Summary for other counseling recommendations.  Return in 2 weeks (on 07/30/2016).   Donnamae Jude, MD

## 2016-07-17 LAB — CBC
HEMATOCRIT: 31.1 % — AB (ref 34.0–46.6)
Hemoglobin: 10.3 g/dL — ABNORMAL LOW (ref 11.1–15.9)
MCH: 29.3 pg (ref 26.6–33.0)
MCHC: 33.1 g/dL (ref 31.5–35.7)
MCV: 88 fL (ref 79–97)
PLATELETS: 207 10*3/uL (ref 150–379)
RBC: 3.52 x10E6/uL — ABNORMAL LOW (ref 3.77–5.28)
RDW: 13.9 % (ref 12.3–15.4)
WBC: 6.1 10*3/uL (ref 3.4–10.8)

## 2016-07-17 LAB — GLUCOSE TOLERANCE, 2 HOURS W/ 1HR
Glucose, 1 hour: 110 mg/dL (ref 65–179)
Glucose, 2 hour: 90 mg/dL (ref 65–152)
Glucose, Fasting: 79 mg/dL (ref 65–91)

## 2016-07-17 LAB — RPR: RPR Ser Ql: NONREACTIVE

## 2016-07-17 LAB — HIV ANTIBODY (ROUTINE TESTING W REFLEX): HIV Screen 4th Generation wRfx: NONREACTIVE

## 2016-07-25 ENCOUNTER — Encounter (HOSPITAL_COMMUNITY): Payer: Self-pay

## 2016-07-31 ENCOUNTER — Encounter: Payer: Medicaid Other | Admitting: Obstetrics and Gynecology

## 2016-08-06 ENCOUNTER — Encounter: Payer: Medicaid Other | Admitting: Family Medicine

## 2016-08-13 ENCOUNTER — Encounter: Payer: Medicaid Other | Admitting: Family Medicine

## 2016-08-16 ENCOUNTER — Ambulatory Visit (INDEPENDENT_AMBULATORY_CARE_PROVIDER_SITE_OTHER): Payer: Medicaid Other | Admitting: Obstetrics & Gynecology

## 2016-08-16 VITALS — BP 109/63 | HR 66 | Wt 169.0 lb

## 2016-08-16 DIAGNOSIS — O0943 Supervision of pregnancy with grand multiparity, third trimester: Secondary | ICD-10-CM

## 2016-08-16 DIAGNOSIS — E010 Iodine-deficiency related diffuse (endemic) goiter: Secondary | ICD-10-CM

## 2016-08-16 DIAGNOSIS — Z8041 Family history of malignant neoplasm of ovary: Secondary | ICD-10-CM | POA: Insufficient documentation

## 2016-08-16 DIAGNOSIS — O0993 Supervision of high risk pregnancy, unspecified, third trimester: Secondary | ICD-10-CM

## 2016-08-16 DIAGNOSIS — O0990 Supervision of high risk pregnancy, unspecified, unspecified trimester: Secondary | ICD-10-CM

## 2016-08-16 NOTE — Patient Instructions (Signed)
Return to clinic for any scheduled appointments or obstetric concerns, or go to MAU for evaluation  

## 2016-08-16 NOTE — Progress Notes (Signed)
   PRENATAL VISIT NOTE  Subjective:  Jennifer Dunlap is a 36 y.o. [id number] at [redacted]w[redacted]d being seen today for ongoing prenatal care.  She is currently monitored for the following issues for this high-risk pregnancy and has History of maternal pericarditis; Thyromegaly; History of bilateral tubal ligation; Supervision of high risk pregnancy, antepartum; [place] multiparity with antenatal problem; and FH: ovarian cancer in first degree relative on her problem list.  Patient reports no complaints.  Contractions: Not present. Vag. Bleeding: None.  Movement: Present. Denies leaking of fluid.   The following portions of the patient's history were reviewed and updated as appropriate: allergies, current medications, past family history, past medical history, past social history, past surgical history and problem list. Problem list updated.  Objective:   Vitals:   08/16/16 0758  BP: 109/63  Pulse: 66  Weight: 169 lb (76.7 kg)    Fetal Status: Fetal Heart Rate (bpm): 132 Fundal Height: 34 cm Movement: Present     General:  Alert, oriented and cooperative. Patient is in no acute distress.  Skin: Skin is warm and dry. No rash noted.   Cardiovascular: Normal heart rate noted  Respiratory: Normal respiratory effort, no problems with respiration noted  Abdomen: Soft, gravid, appropriate for gestational age. Pain/Pressure: Absent     Pelvic:  Cervical exam deferred        Extremities: Normal range of motion.  Edema: None  Mental Status: Normal mood and affect. Normal behavior. Normal judgment and thought content.   Assessment and Plan:  Pregnancy: [id number] at [redacted]w[redacted]d  1. Thyromegaly Will check labs. Growth scan ordered. - T4, free - T3, free - TSH - [place] MFM OB FOLLOW UP; Future  2. [place] multiparity with antenatal problem in third trimester 3. Supervision of high risk pregnancy, antepartum Preterm labor symptoms and general obstetric precautions including but not limited to vaginal bleeding,  contractions, leaking of fluid and fetal movement were reviewed in detail with the patient. Please refer to After Visit Summary for other counseling recommendations.  Return in about 2 weeks (around 08/30/2016) for OB Visit ([place]).   Verita Schneiders, MD

## 2016-08-17 LAB — T3, FREE: T3 FREE: 3.1 pg/mL (ref 2.0–4.4)

## 2016-08-17 LAB — T4, FREE: Free T4: 1.19 ng/dL (ref 0.82–1.77)

## 2016-08-17 LAB — TSH: TSH: 1.23 u[IU]/mL (ref 0.450–4.500)

## 2016-08-27 ENCOUNTER — Ambulatory Visit (INDEPENDENT_AMBULATORY_CARE_PROVIDER_SITE_OTHER): Payer: Medicaid Other | Admitting: Obstetrics and Gynecology

## 2016-08-27 ENCOUNTER — Other Ambulatory Visit (HOSPITAL_COMMUNITY)
Admission: RE | Admit: 2016-08-27 | Discharge: 2016-08-27 | Disposition: A | Discharge status: Home / Self Care | Payer: Medicaid Other | Source: Ambulatory Visit | Attending: Obstetrics and Gynecology | Admitting: Obstetrics and Gynecology

## 2016-08-27 VITALS — BP 109/65 | HR 76 | Wt 166.6 lb

## 2016-08-27 DIAGNOSIS — Z3009 Encounter for other general counseling and advice on contraception: Secondary | ICD-10-CM | POA: Insufficient documentation

## 2016-08-27 DIAGNOSIS — N898 Other specified noninflammatory disorders of vagina: Secondary | ICD-10-CM | POA: Diagnosis present

## 2016-08-27 DIAGNOSIS — O0990 Supervision of high risk pregnancy, unspecified, unspecified trimester: Secondary | ICD-10-CM

## 2016-08-27 DIAGNOSIS — B373 Candidiasis of vulva and vagina: Secondary | ICD-10-CM | POA: Insufficient documentation

## 2016-08-27 DIAGNOSIS — O9989 Other specified diseases and conditions complicating pregnancy, childbirth and the puerperium: Secondary | ICD-10-CM

## 2016-08-27 DIAGNOSIS — O0993 Supervision of high risk pregnancy, unspecified, third trimester: Secondary | ICD-10-CM

## 2016-08-27 DIAGNOSIS — E010 Iodine-deficiency related diffuse (endemic) goiter: Secondary | ICD-10-CM

## 2016-08-27 DIAGNOSIS — Z8041 Family history of malignant neoplasm of ovary: Secondary | ICD-10-CM

## 2016-08-27 NOTE — Progress Notes (Signed)
Subjective:  Jennifer Dunlap is a 36 y.o. [id number] at [redacted]w[redacted]d being seen today for ongoing prenatal care.  She is currently monitored for the following issues for this low-risk pregnancy and has History of maternal pericarditis; Thyromegaly; History of bilateral tubal ligation; Supervision of high risk pregnancy, antepartum; Grand multiparity with antenatal problem; FH: ovarian cancer in first degree relative; and Unwanted fertility on her problem list.  Patient reports vaginal irritation.  Contractions: Not present. Vag. Bleeding: None.  Movement: Present. Denies leaking of fluid.   The following portions of the patient's history were reviewed and updated as appropriate: allergies, current medications, past family history, past medical history, past social history, past surgical history and problem list. Problem list updated.  Objective:   Vitals:   08/27/16 0747  BP: 109/65  Pulse: 76  Weight: 166 lb 9.6 oz (75.6 kg)    Fetal Status: Fetal Heart Rate (bpm): 130   Movement: Present     General:  Alert, oriented and cooperative. Patient is in no acute distress.  Skin: Skin is warm and dry. No rash noted.   Cardiovascular: Normal heart rate noted  Respiratory: Normal respiratory effort, no problems with respiration noted  Abdomen: Soft, gravid, appropriate for gestational age. Pain/Pressure: Present     Pelvic:  Cervical exam deferred        Extremities: Normal range of motion.  Edema: Trace  Mental Status: Normal mood and affect. Normal behavior. Normal judgment and thought content.   Urinalysis:      Assessment and Plan:  Pregnancy: [id number] at [redacted]w[redacted]d  1. Vaginal irritation Pt used a different soap. Will check vaginal swab. - Cervicovaginal ancillary only  2. Supervision of high risk pregnancy, antepartum Stable GBS and vaginal cultures next visit.  3. Thyromegaly Thyroid labs normal last visit  4. FH: ovarian cancer in first degree relative Desires interval salpingectomy  I  agree this would be best treatment for pt. Spoke to [place], she will check with billing d/t pt having Medicaid  5. Unwanted fertility H/O failed tubal. See above BTL papers signed 08/16/16  Preterm labor symptoms and general obstetric precautions including but not limited to vaginal bleeding, contractions, leaking of fluid and fetal movement were reviewed in detail with the patient. Please refer to After Visit Summary for other counseling recommendations.  Return in about 2 weeks (around 09/10/2016) for OB visit.   Chancy Milroy, MD

## 2016-08-28 LAB — CERVICOVAGINAL ANCILLARY ONLY
Bacterial vaginitis: NEGATIVE
CANDIDA VAGINITIS: POSITIVE — AB

## 2016-08-29 ENCOUNTER — Telehealth: Payer: Self-pay | Admitting: Lab

## 2016-08-29 DIAGNOSIS — B373 Candidiasis of vulva and vagina: Secondary | ICD-10-CM

## 2016-08-29 DIAGNOSIS — B3731 Acute candidiasis of vulva and vagina: Secondary | ICD-10-CM

## 2016-08-29 MED ORDER — TERCONAZOLE 0.4 % VA CREA: 1.0000 | TOPICAL_CREAM | Freq: Every day | VAGINAL | 0 refills | Status: DC

## 2016-08-29 NOTE — Telephone Encounter (Signed)
Patient called for results for test which was positive for candida vaginitis , told patient that I will send over a prescription for Terazol cream to use for 7days and to not have sex until all of medication is completed.

## 2016-08-30 ENCOUNTER — Telehealth: Payer: Self-pay

## 2016-08-30 NOTE — Telephone Encounter (Signed)
Called patient inform her of yeast and need to start taking Terazol. Medication has been called into pharmacy.

## 2016-08-30 NOTE — Telephone Encounter (Signed)
-----   Message from Chancy Milroy, MD sent at 08/29/2016  9:22 AM EDT ----- Lonn [place] vaginal cream 1 applicator qhs x 7 days Thanks Legrand Como

## 2016-08-31 ENCOUNTER — Ambulatory Visit (HOSPITAL_COMMUNITY)
Admission: RE | Admit: 2016-08-31 | Discharge: 2016-08-31 | Disposition: A | Discharge status: Home / Self Care | Payer: Medicaid Other | Source: Ambulatory Visit | Attending: Obstetrics & Gynecology | Admitting: Obstetrics & Gynecology

## 2016-08-31 DIAGNOSIS — D259 Leiomyoma of uterus, unspecified: Secondary | ICD-10-CM | POA: Diagnosis not present

## 2016-08-31 DIAGNOSIS — E079 Disorder of thyroid, unspecified: Secondary | ICD-10-CM | POA: Insufficient documentation

## 2016-08-31 DIAGNOSIS — Z3A35 35 weeks gestation of pregnancy: Secondary | ICD-10-CM | POA: Diagnosis not present

## 2016-08-31 DIAGNOSIS — O3413 Maternal care for benign tumor of corpus uteri, third trimester: Secondary | ICD-10-CM | POA: Insufficient documentation

## 2016-08-31 DIAGNOSIS — O99283 Endocrine, nutritional and metabolic diseases complicating pregnancy, third trimester: Secondary | ICD-10-CM | POA: Insufficient documentation

## 2016-08-31 DIAGNOSIS — E010 Iodine-deficiency related diffuse (endemic) goiter: Secondary | ICD-10-CM

## 2016-09-10 ENCOUNTER — Other Ambulatory Visit (HOSPITAL_COMMUNITY)
Admission: RE | Admit: 2016-09-10 | Discharge: 2016-09-10 | Disposition: A | Discharge status: Home / Self Care | Payer: Medicaid Other | Source: Ambulatory Visit | Attending: Family Medicine | Admitting: Family Medicine

## 2016-09-10 ENCOUNTER — Ambulatory Visit (INDEPENDENT_AMBULATORY_CARE_PROVIDER_SITE_OTHER): Payer: Medicaid Other | Admitting: Family Medicine

## 2016-09-10 VITALS — BP 110/68 | HR 81 | Wt 169.4 lb

## 2016-09-10 DIAGNOSIS — Z3A36 36 weeks gestation of pregnancy: Secondary | ICD-10-CM | POA: Diagnosis not present

## 2016-09-10 DIAGNOSIS — Z113 Encounter for screening for infections with a predominantly sexual mode of transmission: Secondary | ICD-10-CM

## 2016-09-10 DIAGNOSIS — O0993 Supervision of high risk pregnancy, unspecified, third trimester: Secondary | ICD-10-CM | POA: Insufficient documentation

## 2016-09-10 DIAGNOSIS — O0943 Supervision of pregnancy with grand multiparity, third trimester: Secondary | ICD-10-CM

## 2016-09-10 DIAGNOSIS — O0990 Supervision of high risk pregnancy, unspecified, unspecified trimester: Secondary | ICD-10-CM

## 2016-09-10 LAB — POCT URINALYSIS DIP (DEVICE)
Glucose, UA: NEGATIVE mg/dL
HGB URINE DIPSTICK: NEGATIVE
Ketones, ur: NEGATIVE mg/dL
NITRITE: NEGATIVE
PH: 6.5 (ref 5.0–8.0)
Protein, ur: 30 mg/dL — AB
SPECIFIC GRAVITY, URINE: 1.025 (ref 1.005–1.030)
UROBILINOGEN UA: 1 mg/dL (ref 0.0–1.0)

## 2016-09-10 NOTE — Patient Instructions (Signed)
 Third Trimester of Pregnancy The third trimester is from week 28 through week 40 (months 7 through 9). The third trimester is a time when the unborn baby (fetus) is growing rapidly. At the end of the ninth month, the fetus is about 20 inches in length and weighs 6-10 pounds. Body changes during your third trimester Your body will continue to go through many changes during pregnancy. The changes vary from woman to woman. During the third trimester:  Your weight will continue to increase. You can expect to gain 25-35 pounds (11-16 kg) by the end of the pregnancy.  You may begin to get stretch marks on your hips, abdomen, and breasts.  You may urinate more often because the fetus is moving lower into your pelvis and pressing on your bladder.  You may develop or continue to have heartburn. This is caused by increased hormones that slow down muscles in the digestive tract.  You may develop or continue to have constipation because increased hormones slow digestion and cause the muscles that push waste through your intestines to relax.  You may develop hemorrhoids. These are swollen veins (varicose veins) in the rectum that can itch or be painful.  You may develop swollen, bulging veins (varicose veins) in your legs.  You may have increased body aches in the pelvis, back, or thighs. This is due to weight gain and increased hormones that are relaxing your joints.  You may have changes in your hair. These can include thickening of your hair, rapid growth, and changes in texture. Some women also have hair loss during or after pregnancy, or hair that feels dry or thin. Your hair will most likely return to normal after your baby is born.  Your breasts will continue to grow and they will continue to become tender. A yellow fluid (colostrum) may leak from your breasts. This is the first milk you are producing for your baby.  Your belly button may stick out.  You may notice more swelling in your  hands, face, or ankles.  You may have increased tingling or numbness in your hands, arms, and legs. The skin on your belly may also feel numb.  You may feel short of breath because of your expanding uterus.  You may have more problems sleeping. This can be caused by the size of your belly, increased need to urinate, and an increase in your body's metabolism.  You may notice the fetus "dropping," or moving lower in your abdomen (lightening).  You may have increased vaginal discharge.  You may notice your joints feel loose and you may have pain around your pelvic bone.  What to expect at prenatal visits You will have prenatal exams every 2 weeks until week 36. Then you will have weekly prenatal exams. During a routine prenatal visit:  You will be weighed to make sure you and the baby are growing normally.  Your blood pressure will be taken.  Your abdomen will be measured to track your baby's growth.  The fetal heartbeat will be listened to.  Any test results from the previous visit will be discussed.  You may have a cervical check near your due date to see if your cervix has softened or thinned (effaced).  You will be tested for Group B streptococcus. This happens between 35 and 37 weeks.  Your health care provider may ask you:  What your birth plan is.  How you are feeling.  If you are feeling the baby move.  If you have   had any abnormal symptoms, such as leaking fluid, bleeding, severe headaches, or abdominal cramping.  If you are using any tobacco products, including cigarettes, chewing tobacco, and electronic cigarettes.  If you have any questions.  Other tests or screenings that may be performed during your third trimester include:  Blood tests that check for low iron levels (anemia).  Fetal testing to check the health, activity level, and growth of the fetus. Testing is done if you have certain medical conditions or if there are problems during the  pregnancy.  Nonstress test (NST). This test checks the health of your baby to make sure there are no signs of problems, such as the baby not getting enough oxygen. During this test, a belt is placed around your belly. The baby is made to move, and its heart rate is monitored during movement.  What is false labor? False labor is a condition in which you feel small, irregular tightenings of the muscles in the womb (contractions) that usually go away with rest, changing position, or drinking water. These are called Braxton Hicks contractions. Contractions may last for hours, days, or even weeks before true labor sets in. If contractions come at regular intervals, become more frequent, increase in intensity, or become painful, you should see your health care provider. What are the signs of labor?  Abdominal cramps.  Regular contractions that start at 10 minutes apart and become stronger and more frequent with time.  Contractions that start on the top of the uterus and spread down to the lower abdomen and back.  Increased pelvic pressure and dull back pain.  A watery or bloody mucus discharge that comes from the vagina.  Leaking of amniotic fluid. This is also known as your "water breaking." It could be a slow trickle or a gush. Let your health care provider know if it has a color or strange odor. If you have any of these signs, call your health care provider right away, even if it is before your due date. Follow these instructions at home: Medicines  Follow your health care provider's instructions regarding medicine use. Specific medicines may be either safe or unsafe to take during pregnancy.  Take a prenatal vitamin that contains at least 600 micrograms (mcg) of folic acid.  If you develop constipation, try taking a stool softener if your health care provider approves. Eating and drinking  Eat a balanced diet that includes fresh fruits and vegetables, whole grains, good sources of protein  such as meat, eggs, or tofu, and low-fat dairy. Your health care provider will help you determine the amount of weight gain that is right for you.  Avoid raw meat and uncooked cheese. These carry germs that can cause birth defects in the baby.  If you have low calcium intake from food, talk to your health care provider about whether you should take a daily calcium supplement.  Eat four or five small meals rather than three large meals a day.  Limit foods that are high in fat and processed sugars, such as fried and sweet foods.  To prevent constipation: ? Drink enough fluid to keep your urine clear or pale yellow. ? Eat foods that are high in fiber, such as fresh fruits and vegetables, whole grains, and beans. Activity  Exercise only as directed by your health care provider. Most women can continue their usual exercise routine during pregnancy. Try to exercise for 30 minutes at least 5 days a week. Stop exercising if you experience uterine contractions.  Avoid   heavy lifting.  Do not exercise in extreme heat or humidity, or at high altitudes.  Wear low-heel, comfortable shoes.  Practice good posture.  You may continue to have sex unless your health care provider tells you otherwise. Relieving pain and discomfort  Take frequent breaks and rest with your legs elevated if you have leg cramps or low back pain.  Take warm sitz baths to soothe any pain or discomfort caused by hemorrhoids. Use hemorrhoid cream if your health care provider approves.  Wear a good support bra to prevent discomfort from breast tenderness.  If you develop varicose veins: ? Wear support pantyhose or compression stockings as told by your healthcare provider. ? Elevate your feet for 15 minutes, 3-4 times a day. Prenatal care  Write down your questions. Take them to your prenatal visits.  Keep all your prenatal visits as told by your health care provider. This is important. Safety  Wear your seat belt at  all times when driving.  Make a list of emergency phone numbers, including numbers for family, friends, the hospital, and police and fire departments. General instructions  Avoid cat litter boxes and soil used by cats. These carry germs that can cause birth defects in the baby. If you have a cat, ask someone to clean the litter box for you.  Do not travel far distances unless it is absolutely necessary and only with the approval of your health care provider.  Do not use hot tubs, steam rooms, or saunas.  Do not drink alcohol.  Do not use any products that contain nicotine or tobacco, such as cigarettes and e-cigarettes. If you need help quitting, ask your health care provider.  Do not use any medicinal herbs or unprescribed drugs. These chemicals affect the formation and growth of the baby.  Do not douche or use tampons or scented sanitary pads.  Do not cross your legs for long periods of time.  To prepare for the arrival of your baby: ? Take prenatal classes to understand, practice, and ask questions about labor and delivery. ? Make a trial run to the hospital. ? Visit the hospital and tour the maternity area. ? Arrange for maternity or paternity leave through employers. ? Arrange for family and friends to take care of pets while you are in the hospital. ? Purchase a rear-facing car seat and make sure you know how to install it in your car. ? Pack your hospital bag. ? Prepare the baby's nursery. Make sure to remove all pillows and stuffed animals from the baby's crib to prevent suffocation.  Visit your dentist if you have not gone during your pregnancy. Use a soft toothbrush to brush your teeth and be gentle when you floss. Contact a health care provider if:  You are unsure if you are in labor or if your water has broken.  You become dizzy.  You have mild pelvic cramps, pelvic pressure, or nagging pain in your abdominal area.  You have lower back pain.  You have persistent  nausea, vomiting, or diarrhea.  You have an unusual or bad smelling vaginal discharge.  You have pain when you urinate. Get help right away if:  Your water breaks before 37 weeks.  You have regular contractions less than 5 minutes apart before 37 weeks.  You have a fever.  You are leaking fluid from your vagina.  You have spotting or bleeding from your vagina.  You have severe abdominal pain or cramping.  You have rapid weight loss or weight   gain.  You have shortness of breath with chest pain.  You notice sudden or extreme swelling of your face, hands, ankles, feet, or legs.  Your baby makes fewer than 10 movements in 2 hours.  You have severe headaches that do not go away when you take medicine.  You have vision changes. Summary  The third trimester is from week 28 through week 40, months 7 through 9. The third trimester is a time when the unborn baby (fetus) is growing rapidly.  During the third trimester, your discomfort may increase as you and your baby continue to gain weight. You may have abdominal, leg, and back pain, sleeping problems, and an increased need to urinate.  During the third trimester your breasts will keep growing and they will continue to become tender. A yellow fluid (colostrum) may leak from your breasts. This is the first milk you are producing for your baby.  False labor is a condition in which you feel small, irregular tightenings of the muscles in the womb (contractions) that eventually go away. These are called Braxton Hicks contractions. Contractions may last for hours, days, or even weeks before true labor sets in.  Signs of labor can include: abdominal cramps; regular contractions that start at 10 minutes apart and become stronger and more frequent with time; watery or bloody mucus discharge that comes from the vagina; increased pelvic pressure and dull back pain; and leaking of amniotic fluid. This information is not intended to replace advice  given to you by your health care provider. Make sure you discuss any questions you have with your health care provider. Document Released: 01/23/2001 Document Revised: 07/07/2015 Document Reviewed: 04/01/2012 [employer] Interactive Patient Education  2017 [employer]   Breastfeeding Deciding to breastfeed is one of the best choices you can make for you and your baby. A change in hormones during pregnancy causes your breast tissue to grow and increases the number and size of your milk ducts. These hormones also allow proteins, sugars, and fats from your blood supply to make breast milk in your milk-producing glands. Hormones prevent breast milk from being released before your baby is born as well as prompt milk flow after birth. Once breastfeeding has begun, thoughts of your baby, as well as his or her sucking or crying, can stimulate the release of milk from your milk-producing glands. Benefits of breastfeeding For Your Baby  Your first milk (colostrum) helps your baby's digestive system function better.  There are antibodies in your milk that help your baby fight off infections.  Your baby has a lower incidence of asthma, allergies, and sudden infant death syndrome.  The nutrients in breast milk are better for your baby than infant formulas and are designed uniquely for your baby's needs.  Breast milk improves your baby's brain development.  Your baby is less likely to develop other conditions, such as childhood obesity, asthma, or type 2 diabetes mellitus.  For You  Breastfeeding helps to create a very special bond between you and your baby.  Breastfeeding is convenient. Breast milk is always available at the correct temperature and costs nothing.  Breastfeeding helps to burn calories and helps you lose the weight gained during pregnancy.  Breastfeeding makes your uterus contract to its prepregnancy size faster and slows bleeding (lochia) after you give birth.  Breastfeeding helps  to lower your risk of developing type 2 diabetes mellitus, osteoporosis, and breast or ovarian cancer later in life.  Signs that your baby is hungry Early Signs of Hunger    Increased alertness or activity.  Stretching.  Movement of the head from side to side.  Movement of the head and opening of the mouth when the corner of the mouth or cheek is stroked (rooting).  Increased sucking sounds, smacking lips, cooing, sighing, or squeaking.  Hand-to-mouth movements.  Increased sucking of fingers or hands.  Late Signs of Hunger  Fussing.  Intermittent crying.  Extreme Signs of Hunger Signs of extreme hunger will require calming and consoling before your baby will be able to breastfeed successfully. Do not wait for the following signs of extreme hunger to occur before you initiate breastfeeding:  Restlessness.  A loud, strong cry.  Screaming.  Breastfeeding basics Breastfeeding Initiation  Find a comfortable place to sit or lie down, with your neck and back well supported.  Place a pillow or rolled up blanket under your baby to bring him or her to the level of your breast (if you are seated). Nursing pillows are specially designed to help support your arms and your baby while you breastfeed.  Make sure that your baby's abdomen is facing your abdomen.  Gently massage your breast. With your fingertips, massage from your chest wall toward your nipple in a circular motion. This encourages milk flow. You may need to continue this action during the feeding if your milk flows slowly.  Support your breast with 4 fingers underneath and your thumb above your nipple. Make sure your fingers are well away from your nipple and your baby's mouth.  Stroke your baby's lips gently with your finger or nipple.  When your baby's mouth is open wide enough, quickly bring your baby to your breast, placing your entire nipple and as much of the colored area around your nipple (areola) as possible into  your baby's mouth. ? More areola should be visible above your baby's upper lip than below the lower lip. ? Your baby's tongue should be between his or her lower gum and your breast.  Ensure that your baby's mouth is correctly positioned around your nipple (latched). Your baby's lips should create a seal on your breast and be turned out (everted).  It is common for your baby to suck about 2-3 minutes in order to start the flow of breast milk.  Latching Teaching your baby how to latch on to your breast properly is very important. An improper latch can cause nipple pain and decreased milk supply for you and poor weight gain in your baby. Also, if your baby is not latched onto your nipple properly, he or she may swallow some air during feeding. This can make your baby fussy. Burping your baby when you switch breasts during the feeding can help to get rid of the air. However, teaching your baby to latch on properly is still the best way to prevent fussiness from swallowing air while breastfeeding. Signs that your baby has successfully latched on to your nipple:  Silent tugging or silent sucking, without causing you pain.  Swallowing heard between every 3-4 sucks.  Muscle movement above and in front of his or her ears while sucking.  Signs that your baby has not successfully latched on to nipple:  Sucking sounds or smacking sounds from your baby while breastfeeding.  Nipple pain.  If you think your baby has not latched on correctly, slip your finger into the corner of your baby's mouth to break the suction and place it between your baby's gums. Attempt breastfeeding initiation again. Signs of Successful Breastfeeding Signs from your baby:  A   gradual decrease in the number of sucks or complete cessation of sucking.  Falling asleep.  Relaxation of his or her body.  Retention of a small amount of milk in his or her mouth.  Letting go of your breast by himself or herself.  Signs from  you:  Breasts that have increased in firmness, weight, and size 1-3 hours after feeding.  Breasts that are softer immediately after breastfeeding.  Increased milk volume, as well as a change in milk consistency and color by the fifth day of breastfeeding.  Nipples that are not sore, cracked, or bleeding.  Signs That Your Baby is Getting Enough Milk  Wetting at least 1-2 diapers during the first 24 hours after birth.  Wetting at least 5-6 diapers every 24 hours for the first week after birth. The urine should be clear or pale yellow by 5 days after birth.  Wetting 6-8 diapers every 24 hours as your baby continues to grow and develop.  At least 3 stools in a 24-hour period by age 5 days. The stool should be soft and yellow.  At least 3 stools in a 24-hour period by age 7 days. The stool should be seedy and yellow.  No loss of weight greater than 10% of birth weight during the first 3 days of age.  Average weight gain of 4-7 ounces (113-198 g) per week after age 4 days.  Consistent daily weight gain by age 5 days, without weight loss after the age of 2 weeks.  After a feeding, your baby may spit up a small amount. This is common. Breastfeeding frequency and duration Frequent feeding will help you make more milk and can prevent sore nipples and breast engorgement. Breastfeed when you feel the need to reduce the fullness of your breasts or when your baby shows signs of hunger. This is called "breastfeeding on demand." Avoid introducing a pacifier to your baby while you are working to establish breastfeeding (the first 4-6 weeks after your baby is born). After this time you may choose to use a pacifier. Research has shown that pacifier use during the first year of a baby's life decreases the risk of sudden infant death syndrome (SIDS). Allow your baby to feed on each breast as long as he or she wants. Breastfeed until your baby is finished feeding. When your baby unlatches or falls asleep  while feeding from the first breast, offer the second breast. Because newborns are often sleepy in the first few weeks of life, you may need to awaken your baby to get him or her to feed. Breastfeeding times will vary from baby to baby. However, the following rules can serve as a guide to help you ensure that your baby is properly fed:  Newborns (babies 4 weeks of age or younger) may breastfeed every 1-3 hours.  Newborns should not go longer than 3 hours during the day or 5 hours during the night without breastfeeding.  You should breastfeed your baby a minimum of 8 times in a 24-hour period until you begin to introduce solid foods to your baby at around 6 months of age.  Breast milk pumping Pumping and storing breast milk allows you to ensure that your baby is exclusively fed your breast milk, even at times when you are unable to breastfeed. This is especially important if you are going back to work while you are still breastfeeding or when you are not able to be present during feedings. Your lactation consultant can give you guidelines on how   long it is safe to store breast milk. A breast pump is a machine that allows you to pump milk from your breast into a sterile bottle. The pumped breast milk can then be stored in a refrigerator or freezer. Some breast pumps are operated by hand, while others use electricity. Ask your lactation consultant which type will work best for you. Breast pumps can be purchased, but some hospitals and breastfeeding support groups lease breast pumps on a monthly basis. A lactation consultant can teach you how to hand express breast milk, if you prefer not to use a pump. Caring for your breasts while you breastfeed Nipples can become dry, cracked, and sore while breastfeeding. The following recommendations can help keep your breasts moisturized and healthy:  Avoid using soap on your nipples.  Wear a supportive bra. Although not required, special nursing bras and tank  tops are designed to allow access to your breasts for breastfeeding without taking off your entire bra or top. Avoid wearing underwire-style bras or extremely tight bras.  Air dry your nipples for 3-4minutes after each feeding.  Use only cotton bra pads to absorb leaked breast milk. Leaking of breast milk between feedings is normal.  Use lanolin on your nipples after breastfeeding. Lanolin helps to maintain your skin's normal moisture barrier. If you use pure lanolin, you do not need to wash it off before feeding your baby again. Pure lanolin is not toxic to your baby. You may also hand express a few drops of breast milk and gently massage that milk into your nipples and allow the milk to air dry.  In the first few weeks after giving birth, some women experience extremely full breasts (engorgement). Engorgement can make your breasts feel heavy, warm, and tender to the touch. Engorgement peaks within 3-5 days after you give birth. The following recommendations can help ease engorgement:  Completely empty your breasts while breastfeeding or pumping. You may want to start by applying warm, moist heat (in the shower or with warm water-soaked hand towels) just before feeding or pumping. This increases circulation and helps the milk flow. If your baby does not completely empty your breasts while breastfeeding, pump any extra milk after he or she is finished.  Wear a snug bra (nursing or regular) or tank top for 1-2 days to signal your body to slightly decrease milk production.  Apply ice packs to your breasts, unless this is too uncomfortable for you.  Make sure that your baby is latched on and positioned properly while breastfeeding.  If engorgement persists after 48 hours of following these recommendations, contact your health care provider or a lactation consultant. Overall health care recommendations while breastfeeding  Eat healthy foods. Alternate between meals and snacks, eating 3 of each per  day. Because what you eat affects your breast milk, some of the foods may make your baby more irritable than usual. Avoid eating these foods if you are sure that they are negatively affecting your baby.  Drink milk, fruit juice, and water to satisfy your thirst (about 10 glasses a day).  Rest often, relax, and continue to take your prenatal vitamins to prevent fatigue, stress, and anemia.  Continue breast self-awareness checks.  Avoid chewing and smoking tobacco. Chemicals from cigarettes that pass into breast milk and exposure to secondhand smoke may harm your baby.  Avoid alcohol and drug use, including marijuana. Some medicines that may be harmful to your baby can pass through breast milk. It is important to ask your health care   provider before taking any medicine, including all over-the-counter and prescription medicine as well as vitamin and herbal supplements. It is possible to become pregnant while breastfeeding. If birth control is desired, ask your health care provider about options that will be safe for your baby. Contact a health care provider if:  You feel like you want to stop breastfeeding or have become frustrated with breastfeeding.  You have painful breasts or nipples.  Your nipples are cracked or bleeding.  Your breasts are red, tender, or warm.  You have a swollen area on either breast.  You have a fever or chills.  You have nausea or vomiting.  You have drainage other than breast milk from your nipples.  Your breasts do not become full before feedings by the fifth day after you give birth.  You feel sad and depressed.  Your baby is too sleepy to eat well.  Your baby is having trouble sleeping.  Your baby is wetting less than 3 diapers in a 24-hour period.  Your baby has less than 3 stools in a 24-hour period.  Your baby's skin or the white part of his or her eyes becomes yellow.  Your baby is not gaining weight by 5 days of age. Get help right away  if:  Your baby is overly tired (lethargic) and does not want to wake up and feed.  Your baby develops an unexplained fever. This information is not intended to replace advice given to you by your health care provider. Make sure you discuss any questions you have with your health care provider. Document Released: 01/29/2005 Document Revised: 07/13/2015 Document Reviewed: 07/23/2012 Elsevier Interactive Patient Education  2017 [employer]  

## 2016-09-10 NOTE — Progress Notes (Signed)
   PRENATAL VISIT NOTE  Subjective:  Jennifer Dunlap is a 36 y.o. [id number] at [redacted]w[redacted]d being seen today for ongoing prenatal care.  She is currently monitored for the following issues for this high-risk pregnancy and has History of maternal pericarditis; Thyromegaly; History of bilateral tubal ligation; Supervision of high risk pregnancy, antepartum; Grand multiparity with antenatal problem; FH: ovarian cancer in first degree relative; and Unwanted fertility on her problem list.  Patient reports no complaints.  Contractions: Not present. Vag. Bleeding: None.  Movement: Absent. Denies leaking of fluid.   The following portions of the patient's history were reviewed and updated as appropriate: allergies, current medications, past family history, past medical history, past social history, past surgical history and problem list. Problem list updated.  Objective:   Vitals:   09/10/16 0756  BP: 110/68  Pulse: 81  Weight: 169 lb 6.4 oz (76.8 kg)    Fetal Status: Fetal Heart Rate (bpm): 132 Fundal Height: 37 cm Movement: Absent  Presentation: Vertex  General:  Alert, oriented and cooperative. Patient is in no acute distress.  Skin: Skin is warm and dry. No rash noted.   Cardiovascular: Normal heart rate noted  Respiratory: Normal respiratory effort, no problems with respiration noted  Abdomen: Soft, gravid, appropriate for gestational age.  Pain/Pressure: Absent     Pelvic: Cervical exam performed Dilation: 1.5 Effacement (%): 20 Station: -3  Extremities: Normal range of motion.  Edema: Trace  Mental Status:  Normal mood and affect. Normal behavior. Normal judgment and thought content.   Assessment and Plan:  Pregnancy: [id number] at [redacted]w[redacted]d  1. [place] multiparity with antenatal problem in third trimester At risk for pp hemorrhage  2. Supervision of high risk pregnancy, antepartum Cultures today - Strep Gp B NAA - Cervicovaginal ancillary only  Preterm labor symptoms and general obstetric  precautions including but not limited to vaginal bleeding, contractions, leaking of fluid and fetal movement were reviewed in detail with the patient. Please refer to After Visit Summary for other counseling recommendations.  Return in 1 week (on 09/17/2016).   Donnamae Jude, MD

## 2016-09-11 LAB — CERVICOVAGINAL ANCILLARY ONLY
CHLAMYDIA, DNA PROBE: NEGATIVE
NEISSERIA GONORRHEA: NEGATIVE

## 2016-09-12 ENCOUNTER — Encounter: Payer: Self-pay | Admitting: Family Medicine

## 2016-09-12 DIAGNOSIS — O99820 Streptococcus B carrier state complicating pregnancy: Secondary | ICD-10-CM | POA: Insufficient documentation

## 2016-09-12 LAB — STREP GP B NAA: Strep Gp B NAA: POSITIVE — AB

## 2016-09-17 ENCOUNTER — Ambulatory Visit (INDEPENDENT_AMBULATORY_CARE_PROVIDER_SITE_OTHER): Payer: Medicaid Other | Admitting: Obstetrics and Gynecology

## 2016-09-17 VITALS — BP 110/66 | HR 66 | Wt 172.6 lb

## 2016-09-17 DIAGNOSIS — O0990 Supervision of high risk pregnancy, unspecified, unspecified trimester: Secondary | ICD-10-CM

## 2016-09-17 DIAGNOSIS — O0993 Supervision of high risk pregnancy, unspecified, third trimester: Secondary | ICD-10-CM

## 2016-09-17 DIAGNOSIS — Z8759 Personal history of other complications of pregnancy, childbirth and the puerperium: Secondary | ICD-10-CM

## 2016-09-17 DIAGNOSIS — E010 Iodine-deficiency related diffuse (endemic) goiter: Secondary | ICD-10-CM

## 2016-09-17 DIAGNOSIS — Z8679 Personal history of other diseases of the circulatory system: Secondary | ICD-10-CM

## 2016-09-17 NOTE — Progress Notes (Signed)
  Prenatal Visit Note Date: 09/17/2016 Clinic: [facility]  Subjective:  Jennifer Dunlap is a 36 y.o. [id number] at [redacted]w[redacted]d being seen today for ongoing prenatal care.  She is currently monitored for the following issues for this high-risk pregnancy and has History of maternal pericarditis; Thyromegaly; History of bilateral tubal ligation; Supervision of high risk pregnancy, antepartum; Grand multiparity with antenatal problem; FH: ovarian cancer in first degree relative; Unwanted fertility; and Group B Streptococcus carrier, +RV culture, currently pregnant on her problem list.  Patient reports no complaints.   Contractions: Not present.  .  Movement: Present. Denies leaking of fluid. No VB  The following portions of the patient's history were reviewed and updated as appropriate: allergies, current medications, past family history, past medical history, past social history, past surgical history and problem list. Problem list updated.  Objective:   Vitals:   09/17/16 0816  BP: 110/66  Pulse: 66  Weight: 172 lb 9.6 oz (78.3 kg)    Fetal Status: Fetal Heart Rate (bpm): 156 Fundal Height: 38 cm Movement: Present  Presentation: Vertex  General:  Alert, oriented and cooperative. Patient is in no acute distress.  Skin: Skin is warm and dry. No rash noted.   Cardiovascular: Normal heart rate noted  Respiratory: Normal respiratory effort, no problems with respiration noted  Abdomen: Soft, gravid, appropriate for gestational age. Pain/Pressure: Absent     Pelvic:  Cervical exam deferred        Extremities: Normal range of motion.  Edema: None  Mental Status: Normal mood and affect. Normal behavior. Normal judgment and thought content.   Urinalysis:      Assessment and Plan:  Pregnancy: [id number] at [redacted]w[redacted]d  1. Thyromegaly Normal TFTs 08/2016  2. History of maternal pericarditis Negative echo this pregnancy  3. Supervision of high risk pregnancy, antepartum +GBS but  sensitivities not down. Pt states she gets throat swelling with PCN. Will re-do GBS. BTL papers already signed.  - Strep Gp B Culture+Rflx  Term labor symptoms and general obstetric precautions including but not limited to vaginal bleeding, contractions, leaking of fluid and fetal movement were reviewed in detail with the patient. Please refer to After Visit Summary for other counseling recommendations.  No Follow-up on file.   Aletha Halim, MD    Normal TFTs 08/2016

## 2016-09-21 LAB — STREP GP B CULTURE+RFLX: STREP GP B CULTURE+RFLX: NEGATIVE

## 2016-09-24 ENCOUNTER — Encounter: Payer: Self-pay | Admitting: Family Medicine

## 2016-09-24 ENCOUNTER — Ambulatory Visit (INDEPENDENT_AMBULATORY_CARE_PROVIDER_SITE_OTHER): Payer: Medicaid Other | Admitting: Obstetrics & Gynecology

## 2016-09-24 VITALS — BP 122/69 | HR 71 | Wt 169.1 lb

## 2016-09-24 DIAGNOSIS — O0990 Supervision of high risk pregnancy, unspecified, unspecified trimester: Secondary | ICD-10-CM

## 2016-09-24 DIAGNOSIS — O0993 Supervision of high risk pregnancy, unspecified, third trimester: Secondary | ICD-10-CM

## 2016-09-24 DIAGNOSIS — O99820 Streptococcus B carrier state complicating pregnancy: Secondary | ICD-10-CM

## 2016-09-24 NOTE — Progress Notes (Signed)
   PRENATAL VISIT NOTE  Subjective:  Jennifer Dunlap is a 36 y.o. [id number] at [redacted]w[redacted]d being seen today for ongoing prenatal care.  She is currently monitored for the following issues for this low-risk pregnancy and has History of maternal pericarditis; Thyromegaly; History of bilateral tubal ligation; Supervision of high risk pregnancy, antepartum; Grand multiparity with antenatal problem; FH: ovarian cancer in first degree relative; Unwanted fertility; and Group B Streptococcus carrier, +RV culture, currently pregnant on her problem list.  Patient reports no complaints.  Contractions: Irregular. Vag. Bleeding: Scant.  Movement: Present. Denies leaking of fluid.   The following portions of the patient's history were reviewed and updated as appropriate: allergies, current medications, past family history, past medical history, past social history, past surgical history and problem list. Problem list updated.  Objective:   Vitals:   09/24/16 0745  BP: 122/69  Pulse: 71  Weight: 169 lb 1.6 oz (76.7 kg)    Fetal Status: Fetal Heart Rate (bpm): 135 Fundal Height: 39 cm Movement: Present  Presentation: Vertex  General:  Alert, oriented and cooperative. Patient is in no acute distress.  Skin: Skin is warm and dry. No rash noted.   Cardiovascular: Normal heart rate noted  Respiratory: Normal respiratory effort, no problems with respiration noted  Abdomen: Soft, gravid, appropriate for gestational age.  Pain/Pressure: Present     Pelvic: Cervical exam deferred        Extremities: Normal range of motion.  Edema: Trace  Mental Status:  Normal mood and affect. Normal behavior. Normal judgment and thought content.   Assessment and Plan:  Pregnancy: [id number] at [redacted]w[redacted]d  1. Supervision of high risk pregnancy, antepartum Nml BP, still to get salpingectomy in post partum period  2. Group B Streptococcus carrier, +RV culture, currently pregnant Rpt culture is negative.    Term labor symptoms and  general obstetric precautions including but not limited to vaginal bleeding, contractions, leaking of fluid and fetal movement were reviewed in detail with the patient. Please refer to After Visit Summary for other counseling recommendations.  Return in about 1 week (around 10/01/2016).   Silas Sacramento, MD

## 2016-10-01 ENCOUNTER — Encounter (HOSPITAL_COMMUNITY): Payer: Self-pay | Admitting: *Deleted

## 2016-10-01 ENCOUNTER — Ambulatory Visit (INDEPENDENT_AMBULATORY_CARE_PROVIDER_SITE_OTHER): Payer: Medicaid Other | Admitting: Obstetrics and Gynecology

## 2016-10-01 ENCOUNTER — Telehealth (HOSPITAL_COMMUNITY): Payer: Self-pay | Admitting: *Deleted

## 2016-10-01 VITALS — BP 107/65 | HR 70 | Wt 166.0 lb

## 2016-10-01 DIAGNOSIS — O0990 Supervision of high risk pregnancy, unspecified, unspecified trimester: Secondary | ICD-10-CM

## 2016-10-01 DIAGNOSIS — O0993 Supervision of high risk pregnancy, unspecified, third trimester: Secondary | ICD-10-CM

## 2016-10-01 NOTE — Progress Notes (Signed)
Prenatal Visit Note Date: 10/01/2016 Clinic: [facility]  Subjective:  Jennifer Dunlap is a 36 y.o. [id number] at [redacted]w[redacted]d being seen today for ongoing prenatal care.  She is currently monitored for the following issues for this low-risk pregnancy and has History of maternal pericarditis; Thyromegaly; History of bilateral tubal ligation; Supervision of high risk pregnancy, antepartum; Grand multiparity with antenatal problem; FH: ovarian cancer in first degree relative; Unwanted fertility; and Group B Streptococcus carrier, +RV culture, currently pregnant on her problem list.  Patient reports no complaints.   Contractions: Not present. Vag. Bleeding: None.  Movement: Present. Denies leaking of fluid.   The following portions of the patient's history were reviewed and updated as appropriate: allergies, current medications, past family history, past medical history, past social history, past surgical history and problem list. Problem list updated.  Objective:   Vitals:   10/01/16 0808  BP: 107/65  Pulse: 70  Weight: 166 lb (75.3 kg)    Fetal Status: Fetal Heart Rate (bpm): 120 Fundal Height: 40 cm Movement: Present  Presentation: Vertex  General:  Alert, oriented and cooperative. Patient is in no acute distress.  Skin: Skin is warm and dry. No rash noted.   Cardiovascular: Normal heart rate noted  Respiratory: Normal respiratory effort, no problems with respiration noted  Abdomen: Soft, gravid, appropriate for gestational age. Pain/Pressure: Present     Pelvic:  Cervical exam performed Dilation: 1.5 Effacement (%): 50 Station: -2  Extremities: Normal range of motion.  Edema: None  Mental Status: Normal mood and affect. Normal behavior. Normal judgment and thought content.   Urinalysis:      Assessment and Plan:  Pregnancy: [id number] at [redacted]w[redacted]d  1. Supervision of high risk pregnancy, antepartum Routine care. GBS pos. Will set up iol for 40/6 or 41/0  term labor symptoms  and general obstetric precautions including but not limited to vaginal bleeding, contractions, leaking of fluid and fetal movement were reviewed in detail with the patient. Please refer to After Visit Summary for other counseling recommendations.  Return in about 1 week (around 10/08/2016) for nst/afi and rob.   Aletha Halim, MD

## 2016-10-01 NOTE — Telephone Encounter (Signed)
Preadmission screen  

## 2016-10-02 ENCOUNTER — Other Ambulatory Visit: Payer: Self-pay | Admitting: Obstetrics and Gynecology

## 2016-10-07 ENCOUNTER — Inpatient Hospital Stay (HOSPITAL_COMMUNITY): Payer: Medicaid Other | Admitting: Anesthesiology

## 2016-10-07 ENCOUNTER — Inpatient Hospital Stay (HOSPITAL_COMMUNITY)
Admission: AD | Admit: 2016-10-07 | Discharge: 2016-10-09 | DRG: 775 | Disposition: A | Discharge status: Home / Self Care | Payer: Medicaid Other | Source: Ambulatory Visit | Attending: Obstetrics and Gynecology | Admitting: Obstetrics and Gynecology

## 2016-10-07 ENCOUNTER — Encounter (HOSPITAL_COMMUNITY): Payer: Self-pay

## 2016-10-07 DIAGNOSIS — O9902 Anemia complicating childbirth: Secondary | ICD-10-CM | POA: Diagnosis present

## 2016-10-07 DIAGNOSIS — Z880 Allergy status to penicillin: Secondary | ICD-10-CM

## 2016-10-07 DIAGNOSIS — Z3A40 40 weeks gestation of pregnancy: Secondary | ICD-10-CM | POA: Diagnosis not present

## 2016-10-07 DIAGNOSIS — O99824 Streptococcus B carrier state complicating childbirth: Principal | ICD-10-CM | POA: Diagnosis present

## 2016-10-07 DIAGNOSIS — D649 Anemia, unspecified: Secondary | ICD-10-CM | POA: Diagnosis present

## 2016-10-07 DIAGNOSIS — Z3493 Encounter for supervision of normal pregnancy, unspecified, third trimester: Secondary | ICD-10-CM | POA: Diagnosis present

## 2016-10-07 LAB — CBC
HCT: 30.3 % — ABNORMAL LOW (ref 36.0–46.0)
HEMOGLOBIN: 10.2 g/dL — AB (ref 12.0–15.0)
MCH: 27.3 pg (ref 26.0–34.0)
MCHC: 33.7 g/dL (ref 30.0–36.0)
MCV: 81.2 fL (ref 78.0–100.0)
Platelets: 216 10*3/uL (ref 150–400)
RBC: 3.73 MIL/uL — ABNORMAL LOW (ref 3.87–5.11)
RDW: 14 % (ref 11.5–15.5)
WBC: 6.5 10*3/uL (ref 4.0–10.5)

## 2016-10-07 LAB — TYPE AND SCREEN
ABO/RH(D): O POS
Antibody Screen: NEGATIVE

## 2016-10-07 LAB — RPR: RPR: NONREACTIVE

## 2016-10-07 MED ORDER — IBUPROFEN 600 MG PO TABS
600.0000 mg | ORAL_TABLET | Freq: Four times a day (QID) | ORAL | Status: DC
  Administered 2016-10-07 – 2016-10-09 (×9): 600 mg via ORAL
  Filled 2016-10-07 (×9): qty 1

## 2016-10-07 MED ORDER — LACTATED RINGERS IV SOLN
INTRAVENOUS | Status: DC
  Administered 2016-10-07 (×2): via INTRAVENOUS

## 2016-10-07 MED ORDER — SOD CITRATE-CITRIC ACID 500-334 MG/5ML PO SOLN
30.0000 mL | ORAL | Status: DC | PRN
Start: 2016-10-07 — End: 2016-10-07

## 2016-10-07 MED ORDER — DIPHENHYDRAMINE HCL 25 MG PO CAPS: 25.0000 mg | ORAL_CAPSULE | Freq: Four times a day (QID) | ORAL | Status: DC | PRN

## 2016-10-07 MED ORDER — EPHEDRINE 5 MG/ML INJ
10.0000 mg | INTRAVENOUS | Status: DC | PRN
  Filled 2016-10-07: qty 2

## 2016-10-07 MED ORDER — OXYCODONE HCL 5 MG PO TABS
5.0000 mg | ORAL_TABLET | ORAL | Status: DC | PRN
  Administered 2016-10-07 – 2016-10-09 (×6): 5 mg via ORAL
  Filled 2016-10-07 (×6): qty 1

## 2016-10-07 MED ORDER — PHENYLEPHRINE 40 MCG/ML (10ML) SYRINGE FOR IV PUSH (FOR BLOOD PRESSURE SUPPORT)
80.0000 ug | PREFILLED_SYRINGE | INTRAVENOUS | Status: DC | PRN
  Filled 2016-10-07: qty 5

## 2016-10-07 MED ORDER — ACETAMINOPHEN 325 MG PO TABS
650.0000 mg | ORAL_TABLET | ORAL | Status: DC | PRN
  Administered 2016-10-07 – 2016-10-09 (×4): 650 mg via ORAL
  Filled 2016-10-07 (×4): qty 2

## 2016-10-07 MED ORDER — ONDANSETRON HCL 4 MG PO TABS: 4.0000 mg | ORAL_TABLET | ORAL | Status: DC | PRN

## 2016-10-07 MED ORDER — PRENATAL MULTIVITAMIN CH
1.0000 | ORAL_TABLET | Freq: Every day | ORAL | Status: DC
  Administered 2016-10-07 – 2016-10-09 (×3): 1 via ORAL
  Filled 2016-10-07 (×3): qty 1

## 2016-10-07 MED ORDER — PHENYLEPHRINE 40 MCG/ML (10ML) SYRINGE FOR IV PUSH (FOR BLOOD PRESSURE SUPPORT)
80.0000 ug | PREFILLED_SYRINGE | INTRAVENOUS | Status: DC | PRN
  Filled 2016-10-07: qty 5
  Filled 2016-10-07: qty 10

## 2016-10-07 MED ORDER — COCONUT OIL OIL
1.0000 "application " | TOPICAL_OIL | Status: DC | PRN
  Administered 2016-10-08: 1 via TOPICAL
  Filled 2016-10-07: qty 120

## 2016-10-07 MED ORDER — BENZOCAINE-MENTHOL 20-0.5 % EX AERO
1.0000 | INHALATION_SPRAY | CUTANEOUS | Status: DC | PRN
Start: 2016-10-07 — End: 2016-10-09

## 2016-10-07 MED ORDER — LACTATED RINGERS IV SOLN: 500.0000 mL | Freq: Once | INTRAVENOUS | Status: DC

## 2016-10-07 MED ORDER — WITCH HAZEL-GLYCERIN EX PADS: 1.0000 "application " | MEDICATED_PAD | CUTANEOUS | Status: DC | PRN

## 2016-10-07 MED ORDER — DIPHENHYDRAMINE HCL 50 MG/ML IJ SOLN: 12.5000 mg | INTRAMUSCULAR | Status: DC | PRN

## 2016-10-07 MED ORDER — FENTANYL 2.5 MCG/ML BUPIVACAINE 1/10 % EPIDURAL INFUSION (WH - ANES)
14.0000 mL/h | INTRAMUSCULAR | Status: DC | PRN
  Administered 2016-10-07 (×2): 14 mL/h via EPIDURAL
  Filled 2016-10-07: qty 100

## 2016-10-07 MED ORDER — VANCOMYCIN HCL IN DEXTROSE 1-5 GM/200ML-% IV SOLN
1000.0000 mg | Freq: Two times a day (BID) | INTRAVENOUS | Status: DC
  Administered 2016-10-07: 1000 mg via INTRAVENOUS
  Filled 2016-10-07 (×2): qty 200

## 2016-10-07 MED ORDER — FENTANYL CITRATE (PF) 100 MCG/2ML IJ SOLN: 50.0000 ug | INTRAMUSCULAR | Status: DC | PRN

## 2016-10-07 MED ORDER — ONDANSETRON HCL 4 MG/2ML IJ SOLN: 4.0000 mg | INTRAMUSCULAR | Status: DC | PRN

## 2016-10-07 MED ORDER — LIDOCAINE HCL (PF) 1 % IJ SOLN
30.0000 mL | INTRAMUSCULAR | Status: DC | PRN
  Filled 2016-10-07: qty 30

## 2016-10-07 MED ORDER — LACTATED RINGERS IV SOLN
500.0000 mL | Freq: Once | INTRAVENOUS | Status: AC
  Administered 2016-10-07: 500 mL via INTRAVENOUS

## 2016-10-07 MED ORDER — TETANUS-DIPHTH-ACELL PERTUSSIS 5-2.5-18.5 LF-MCG/0.5 IM SUSP: 0.5000 mL | Freq: Once | INTRAMUSCULAR | Status: DC

## 2016-10-07 MED ORDER — LACTATED RINGERS IV SOLN: 500.0000 mL | INTRAVENOUS | Status: DC | PRN

## 2016-10-07 MED ORDER — OXYTOCIN BOLUS FROM INFUSION
500.0000 mL | Freq: Once | INTRAVENOUS | Status: AC
  Administered 2016-10-07: 500 mL via INTRAVENOUS

## 2016-10-07 MED ORDER — TERBUTALINE SULFATE 1 MG/ML IJ SOLN
0.2500 mg | Freq: Once | INTRAMUSCULAR | Status: DC | PRN
  Filled 2016-10-07: qty 1

## 2016-10-07 MED ORDER — ONDANSETRON HCL 4 MG/2ML IJ SOLN: 4.0000 mg | Freq: Four times a day (QID) | INTRAMUSCULAR | Status: DC | PRN

## 2016-10-07 MED ORDER — ACETAMINOPHEN 325 MG PO TABS: 650.0000 mg | ORAL_TABLET | ORAL | Status: DC | PRN

## 2016-10-07 MED ORDER — MISOPROSTOL 25 MCG QUARTER TABLET
25.0000 ug | ORAL_TABLET | ORAL | Status: DC | PRN
  Filled 2016-10-07: qty 1

## 2016-10-07 MED ORDER — DIBUCAINE 1 % RE OINT: 1.0000 "application " | TOPICAL_OINTMENT | RECTAL | Status: DC | PRN

## 2016-10-07 MED ORDER — SENNOSIDES-DOCUSATE SODIUM 8.6-50 MG PO TABS
2.0000 | ORAL_TABLET | ORAL | Status: DC
  Administered 2016-10-07 – 2016-10-08 (×2): 2 via ORAL
  Filled 2016-10-07 (×2): qty 2

## 2016-10-07 MED ORDER — LIDOCAINE HCL (PF) 1 % IJ SOLN
INTRAMUSCULAR | Status: DC | PRN
  Administered 2016-10-07: 3 mL
  Administered 2016-10-07: 5 mL
  Administered 2016-10-07: 2 mL

## 2016-10-07 MED ORDER — SIMETHICONE 80 MG PO CHEW
80.0000 mg | CHEWABLE_TABLET | ORAL | Status: DC | PRN
Start: 2016-10-07 — End: 2016-10-09

## 2016-10-07 MED ORDER — OXYTOCIN 40 UNITS IN LACTATED RINGERS INFUSION - SIMPLE MED
2.5000 [IU]/h | INTRAVENOUS | Status: DC
  Filled 2016-10-07: qty 1000

## 2016-10-07 MED ORDER — ZOLPIDEM TARTRATE 5 MG PO TABS: 5.0000 mg | ORAL_TABLET | Freq: Every evening | ORAL | Status: DC | PRN

## 2016-10-07 NOTE — Anesthesia Postprocedure Evaluation (Signed)
Anesthesia Post Note  Patient: Jennifer Dunlap  Procedure(s) Performed: * No procedures listed *     Patient location during evaluation: Mother Baby Anesthesia Type: Epidural Level of consciousness: awake and alert and oriented Pain management: satisfactory to patient Vital Signs Assessment: post-procedure vital signs reviewed and stable Respiratory status: respiratory function stable Cardiovascular status: stable Postop Assessment: no headache, no backache, epidural receding, patient able to bend at knees, no signs of nausea or vomiting and adequate PO intake Anesthetic complications: no    Last Vitals:  Vitals:   10/07/16 0807 10/07/16 1156  BP: 114/61 (!) 103/55  Pulse: (!) 54 87  Resp: 14 16  Temp: 36.5 C 36.7 C  SpO2: 100%     Last Pain:  Vitals:   10/07/16 1100  TempSrc:   PainSc: 0-No pain   Pain Goal: Patients Stated Pain Goal: 0 (10/07/16 0143)               [name]

## 2016-10-07 NOTE — Anesthesia Preprocedure Evaluation (Signed)
Anesthesia Evaluation  Patient identified by MRN, date of birth, ID band Patient awake    Reviewed: Allergy & Precautions, Patient's Chart, lab work & pertinent test results  Airway Mallampati: II  TM Distance: >3 FB     Dental   Pulmonary neg pulmonary ROS,    Pulmonary exam normal        Cardiovascular negative cardio ROS Normal cardiovascular exam     Neuro/Psych negative neurological ROS     GI/Hepatic negative GI ROS, Neg liver ROS,   Endo/Other  negative endocrine ROS  Renal/GU negative Renal ROS     Musculoskeletal   Abdominal   Peds  Hematology  (+) anemia ,   Anesthesia Other Findings   Reproductive/Obstetrics (+) Pregnancy                             Lab Results  Component Value Date   WBC 6.5 10/07/2016   HGB 10.2 (L) 10/07/2016   HCT 30.3 (L) 10/07/2016   MCV 81.2 10/07/2016   PLT 216 10/07/2016    Anesthesia Physical Anesthesia Plan  ASA: II  Anesthesia Plan: Epidural   Post-op Pain Management:    Induction:   PONV Risk Score and Plan: Treatment may vary due to age or medical condition  Airway Management Planned: Natural Airway  Additional Equipment:   Intra-op Plan:   Post-operative Plan:   Informed Consent: I have reviewed the patients History and Physical, chart, labs and discussed the procedure including the risks, benefits and alternatives for the proposed anesthesia with the patient or authorized representative who has indicated his/her understanding and acceptance.     Plan Discussed with:   Anesthesia Plan Comments:         Anesthesia Quick Evaluation

## 2016-10-07 NOTE — MAU Note (Signed)
Pt reports contractions since 8pm and now every 5 mins. Pt denies LOF or vaginal bleeding. Reports good fetal movement. States she was 1.5cm on last exam.

## 2016-10-07 NOTE — H&P (Signed)
LABOR AND DELIVERY ADMISSION HISTORY AND PHYSICAL NOTE  Jennifer Dunlap is a 36 y.o. female [phone] with IUP at 110w4d by LMP and 8 wk [place] presenting for SOL.   She reports strong contractions every 5 minutes rates 7/10 in pain.  She reports positive fetal movement. She denies leakage of fluid or vaginal bleeding. She has 7 other children, 6 girls 1 boy, she is s/p BTL that failed. She is getting repeat BTL pp to remove tubes.  Prenatal History/Complications:  Past Medical History: Past Medical History:  Diagnosis Date  . Benign tumor of thyroid gland   . Fibroid   . Pericarditis 2011    Past Surgical History: Past Surgical History:  Procedure Laterality Date  . LAPAROSCOPIC TUBAL LIGATION Bilateral 10/12/2014   Procedure: LAPAROSCOPIC TUBAL LIGATION;  Surgeon: Woodroe Mode, MD;  Location: [facility];  Service: Gynecology;  Laterality: Bilateral;    Obstetrical History: OB History    Gravida Para Term Preterm AB Living   10 7 7  0 2 7   SAB TAB Ectopic Multiple Live Births   0 2 0 0 7      Social History: Social History   Social History  . Marital status: Single    Spouse name: N/A  . Number of children: N/A  . Years of education: N/A   Social History Main Topics  . Smoking status: Never Smoker  . Smokeless tobacco: Never Used  . Alcohol use No     Comment: occcasional  . Drug use: No  . Sexual activity: Yes    Birth control/ protection: Surgical   Other Topics Concern  . None   Social History Narrative  . None    Family History: Family History  Problem Relation Age of Onset  . Heart disease Father   . Heart attack Father   . Stroke Father   . Cancer Sister 46       ovarian  . Hypertension Mother   . Heart attack Paternal Grandmother     Allergies: Allergies  Allergen Reactions  . Penicillins Anaphylaxis    Has patient had a PCN reaction causing immediate rash, facial/tongue/throat swelling, SOB or lightheadedness with hypotension: yes Has patient  had a PCN reaction causing severe rash involving mucus membranes or skin necrosis: no Has patient had a PCN reaction that required hospitalization unknown Has patient had a PCN reaction occurring within the last 10 years: no If all of the above answers are "NO", then may proceed with Cephalosporin use.     Prescriptions Prior to Admission  Medication Sig Dispense Refill Last Dose  . Pediatric Multivit-Minerals-C (FLINTSTONES GUMMIES PO) Take by mouth.   10/06/2016 at Unknown time     Review of Systems   All systems reviewed and negative except as stated in HPI  Blood pressure 108/63, pulse 81, temperature 97.8 F (36.6 C), temperature source Oral, resp. rate 16, height 5\' 3"  (1.6 m), weight 77.6 kg (171 lb), last menstrual period 12/28/2015, SpO2 95 %, currently breastfeeding. General appearance: alert and cooperative Lungs: no respiratory distress Heart: regular rate Abdomen: soft, non-tender Extremities: No calf swelling or tenderness Presentation: cephalic by nurse exam Fetal monitoring: baseline 140, minimal variability, accel present, no decels Uterine activity: q5 mins Dilation: 4.5 Effacement (%): 70 Station: -2 Exam by:: benji stanley RN   Prenatal labs: ABO, Rh: O/POS/-- (02/08 1452) Antibody: NEG (02/08 1452) Rubella: immune RPR: Non Reactive (06/04 0805)  HBsAg: NEGATIVE (02/08 1452)  HIV: NONREACTIVE (02/08 1452)  GBS: Positive (  07/30 0931)  Genetic screening:  declined Anatomy US: normal  Prenatal Transfer Tool  Maternal Diabetes: No Genetic Screening: Declined Maternal Ultrasounds/Referrals: Normal Fetal Ultrasounds or other Referrals:  Referred to Materal Fetal Medicine  Maternal Substance Abuse:  No Significant Maternal Medications:  None Significant Maternal Lab Results: None  No results found for this or any previous visit (from the past 24 hour(s)).  Patient Active Problem List   Diagnosis Date Noted  . Group B Streptococcus carrier, +RV  culture, currently pregnant 09/12/2016  . Unwanted fertility 08/27/2016  . FH: ovarian cancer in first degree relative 08/16/2016  . [place] multiparity with antenatal problem 06/18/2016  . Supervision of high risk pregnancy, antepartum 03/22/2016  . History of bilateral tubal ligation 02/16/2016  . 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 History of maternal pericarditis 02/20/2012    Assessment: Jennifer Dunlap is a 36 y.o. [id number] at [redacted]w[redacted]d here for SOL  #Labor: SOL #Pain: Desires epidural #FWB: Cat 1 tracing #ID:  GBS positive- PCN allergy, no sensitivities, will tx w/ vanc #MOF: breast #MOC:OCP #Circ:  n/a  Steve Rattler, DO PGY-2 8/26/20182:14 AM  The patient was seen and examined by me also Agree with note NST reactive and reassuring UCs as listed Cervical exams as listed in note  Seabron Spates, CNM

## 2016-10-07 NOTE — Lactation Note (Signed)
This note was copied from a baby's chart. Lactation Consultation Note  Patient Name: Girl Ilo Beamon [id number] Date: 10/07/2016 Reason for consult: Initial assessment Baby at 5 hr of life. Upon entry baby was sleeping in mom's arms. Experienced bf mom reports baby is latching well. She denies breast or nipple. She voiced no concerns. She exclusively latched each of her children until she went back to work, then she would pump to feed. She did offer formula when she was not able to pump enough while working. Discussed baby behavior, feeding frequency, baby belly size, voids, wt loss, breast changes, and nipple care. Mom stated she can manually express and has a spoon in the room. Given lactation handouts. Aware of OP services and support group. Mom will offer the breast on demand 8+/24hr. She will call staff to view a latch.     Maternal Data Has patient been taught Hand Expression?: Yes Does the patient have breastfeeding experience prior to this delivery?: Yes  Feeding Feeding Type: Breast Fed Length of feed: 30 min  LATCH Score                   Interventions    Lactation Tools Discussed/Used WIC Program: Yes   Consult Status Consult Status: Follow-up Date: 10/08/16 Follow-up type: In-patient    Denzil Hughes 10/07/2016, 10:30 AM

## 2016-10-07 NOTE — Anesthesia Procedure Notes (Signed)
Epidural Patient location during procedure: OB Start time: 10/07/2016 3:35 AM End time: 10/07/2016 3:40 AM  Staffing Anesthesiologist: Suzette Battiest Performed: anesthesiologist   Preanesthetic Checklist Completed: patient identified, site marked, surgical consent, pre-op evaluation, timeout performed, IV checked, risks and benefits discussed and monitors and equipment checked  Epidural Patient position: sitting Prep: site prepped and draped and DuraPrep Patient monitoring: continuous pulse ox and blood pressure Approach: midline Location: L4-L5 Injection technique: LOR air  Needle:  Needle type: Tuohy  Needle gauge: 17 G Needle length: 9 cm and 9 Needle insertion depth: 5 cm cm Catheter type: closed end flexible Catheter size: 19 Gauge Catheter at skin depth: 10 cm Test dose: negative  Assessment Events: blood not aspirated, injection not painful, no injection resistance, negative IV test and no paresthesia

## 2016-10-08 ENCOUNTER — Encounter: Payer: Medicaid Other | Admitting: Family Medicine

## 2016-10-08 ENCOUNTER — Other Ambulatory Visit: Payer: Medicaid Other

## 2016-10-08 ENCOUNTER — Encounter: Payer: Medicaid Other | Admitting: Obstetrics and Gynecology

## 2016-10-08 NOTE — Lactation Note (Signed)
This note was copied from a baby's chart. Lactation Consultation Note  Patient Name: Jennifer Dunlap Date: 10/08/2016 Reason for consult: Follow-up assessment   Baby 81 hours old and out of room for hearing screen. Mother states her nipple are starting to get sore and she has been using lanolin. Provided education and coconut oil.  Encouraged depth.  No cracks or abrasions. Mother denies other questions or concerns and states baby is breastfeeding well. Provided her with hand pump. Reviewed engorgement care and monitoring voids/stools. Mom encouraged to feed baby 8-12 times/24 hours and with feeding cues.      Maternal Data    Feeding Feeding Type: Breast Fed Length of feed: 40 min  LATCH Score                   Interventions    Lactation Tools Discussed/Used     Consult Status Consult Status: PRN    Carlye Grippe 10/08/2016, 4:42 PM

## 2016-10-08 NOTE — Progress Notes (Signed)
Post Partum Day 1 Subjective: having cramping and pain with urination. Would like to stay another day does not feel ready to leave.  Objective: Blood pressure 111/66, pulse 68, temperature 98 F (36.7 C), temperature source Oral, resp. rate 18, height 5\' 3"  (1.6 m), weight 77.6 kg (171 lb), last menstrual period 12/28/2015, SpO2 99 %, unknown if currently breastfeeding.  Physical Exam:  General: alert, cooperative and no distress Lochia: appropriate Uterine Fundus: firm DVT Evaluation: No evidence of DVT seen on physical exam.   Recent Labs  10/07/16 0229  HGB 10.2*  HCT 30.3*    Assessment/Plan: Plan for discharge tomorrow   LOS: 1 day   Steve Rattler 10/08/2016, 7:42 AM

## 2016-10-09 ENCOUNTER — Other Ambulatory Visit: Payer: Medicaid Other

## 2016-10-09 ENCOUNTER — Encounter: Payer: Medicaid Other | Admitting: Certified Nurse Midwife

## 2016-10-09 MED ORDER — IBUPROFEN 600 MG PO TABS: 600.0000 mg | ORAL_TABLET | Freq: Four times a day (QID) | ORAL | 0 refills | Status: DC

## 2016-10-09 NOTE — Discharge Instructions (Signed)

## 2016-10-09 NOTE — Discharge Summary (Signed)
OB Discharge Summary  Patient Name: Jennifer Dunlap DOB: 1980-09-15 MRN: [id number]  Date of admission: 10/07/2016 Delivering MD: Lucila Maine C   Date of discharge: 10/09/2016  Admitting diagnosis: 40wks CTX 5MINS  Intrauterine pregnancy: 108w4d     Secondary diagnosis:Active Problems:   * No active hospital problems. *  Additional problems:grand multipara in active labor     Discharge diagnosis: Term Pregnancy Delivered                                                                      Complications: None  Hospital course:  Onset of Labor With Vaginal Delivery     36 y.o. yo [id number] at [redacted]w[redacted]d was admitted in Active Labor on 10/07/2016. Patient had an uncomplicated labor course as follows:  Membrane Rupture Time/Date: 4:00 AM ,10/07/2016   Intrapartum Procedures: Episiotomy: None [1]                                         Lacerations:  None [1]  Patient had a delivery of a Viable infant. 10/07/2016  Information for the patient's newborn:  Lorrane, Mccay [[id number]]  Delivery Method: Vaginal, Spontaneous Delivery (Filed from Delivery Summary)    Pateint had an uncomplicated postpartum course.  She is ambulating, tolerating a regular diet, passing flatus, and urinating well. Patient is discharged home in stable condition on 10/09/16.   Physical exam  Vitals:   10/07/16 1156 10/08/16 0545 10/08/16 1704 10/09/16 0620  BP: (!) 103/55 111/66 123/71 110/70  Pulse: 87 68 68 (!) 59  Resp: 16 18 18 18   Temp: 98 F (36.7 C) 98 F (36.7 C) (!) 97.5 F (36.4 C) 98.2 F (36.8 C)  TempSrc:  Oral Oral Oral  SpO2:  99%    Weight:      Height:       General: alert Lochia: appropriate Uterine Fundus: firm Incision: N/A DVT Evaluation: No evidence of DVT seen on physical exam. Labs: Lab Results  Component Value Date   WBC 6.5 10/07/2016   HGB 10.2 (L) 10/07/2016   HCT 30.3 (L) 10/07/2016   MCV 81.2 10/07/2016   PLT 216 10/07/2016   CMP Latest Ref Rng & Units  06/05/2016  Glucose 65 - 99 mg/dL 102(H)  BUN 6 - 20 mg/dL 6  Creatinine 0.44 - 1.00 mg/dL 0.48  Sodium 135 - 145 mmol/L 136  Potassium 3.5 - 5.1 mmol/L 3.2(L)  Chloride 101 - 111 mmol/L 106  CO2 22 - 32 mmol/L 25  Calcium 8.9 - 10.3 mg/dL 9.5  Total Protein 6.5 - 8.1 g/dL 6.5  Total Bilirubin 0.3 - 1.2 mg/dL 0.4  Alkaline Phos 38 - 126 U/L 52  AST 15 - 41 U/L 13(L)  ALT 14 - 54 U/L 9(L)    Discharge instruction: per After Visit Summary and "Baby and Me Booklet".  After Visit Meds:  Allergies as of 10/09/2016      Reactions   Penicillins Anaphylaxis, Other (See Comments)   Has patient had a PCN reaction causing immediate rash, facial/tongue/throat swelling, SOB or lightheadedness with hypotension: Yes Has patient had a PCN reaction causing  severe rash involving mucus membranes or skin necrosis: No Has patient had a PCN reaction that required hospitalization No Has patient had a PCN reaction occurring within the last 10 years: No If all of the above answers are "NO", then may proceed with Cephalosporin use.      Medication List    TAKE these medications   FLINTSTONES GUMMIES PO Take 3 each by mouth daily.   ibuprofen 600 MG tablet Commonly known as:  ADVIL,MOTRIN Take 1 tablet (600 mg total) by mouth every 6 (six) hours.            Discharge Care Instructions        Start     Ordered   10/09/16 0000  ibuprofen (ADVIL,MOTRIN) 600 MG tablet  Every 6 hours     10/09/16 3536      Diet: routine diet  Activity: Advance as tolerated. Pelvic rest for 6 weeks.   Outpatient follow up:4 weeks Follow up Appt:Future Appointments Date Time Provider [place]  11/13/2016 10:40 AM Jorje Guild, NP [facility]   Follow up visit: No Follow-up on file.  Postpartum contraception: plans removal of tubes, will discuss at postpartum visit  Newborn Data: Live born female  Birth Weight: 7 lb 11.6 oz (3505 g) APGAR: 9, 9  Baby Feeding: Breast Disposition:home  with mother   10/09/2016 Emily Filbert, MD

## 2016-10-09 NOTE — Lactation Note (Signed)
This note was copied from a baby's chart. Lactation Consultation Note  Patient Name: Jennifer Dunlap [id number] Date: 10/09/2016 Reason for consult: Follow-up assessment;Infant weight loss (8% weight loss, per mom breast are fuller today , increased swallows and baby recently breast fed )  Baby is 77 hours old, and has been consistent at the breast, see doc flow sheets.  LC reviewed doc flow sheets and updated per mom.  Mom denies sore nipples and breast are getting fuller, sore nipple and engorgement prevention and tx  Reviewed. Mom already has a hand pump, and DEBP.  Mother informed of post-discharge support and given phone number to the lactation department, including services for phone call assistance; out-patient appointments; and breastfeeding support group. List of other breastfeeding resources in the community given in the handout. Encouraged mother to call for problems or concerns related to breastfeeding.   Maternal Data    Feeding Feeding Type:  (per mom baby last fed at 0700 ) Length of feed: 30 min (per mom )  LATCH Score Latch: Grasps breast easily, tongue down, lips flanged, rhythmical sucking.  Audible Swallowing: A few with stimulation  Type of Nipple: Everted at rest and after stimulation  Comfort (Breast/Nipple): Soft / non-tender  Hold (Positioning): No assistance needed to correctly position infant at breast.  LATCH Score: 9  Interventions Interventions: Breast feeding basics reviewed;Hand pump  Lactation Tools Discussed/Used Tools: Pump Breast pump type: Manual Pump Review: Setup, frequency, and cleaning (has already been given the hand pump by the [facility] )   Consult Status Consult Status: Complete Date: 10/09/16    Myer Haff 10/09/2016, 9:19 AM

## 2016-10-10 ENCOUNTER — Inpatient Hospital Stay (HOSPITAL_COMMUNITY): Admission: RE | Admit: 2016-10-10 | Payer: Medicaid Other | Source: Ambulatory Visit

## 2016-11-01 ENCOUNTER — Other Ambulatory Visit: Payer: Self-pay | Admitting: Otolaryngology

## 2016-11-05 ENCOUNTER — Ambulatory Visit (HOSPITAL_COMMUNITY)
Admission: EM | Admit: 2016-11-05 | Discharge: 2016-11-05 | Disposition: A | Discharge status: Home / Self Care | Payer: Medicaid Other | Attending: Internal Medicine | Admitting: Internal Medicine

## 2016-11-05 ENCOUNTER — Telehealth: Payer: Self-pay | Admitting: General Practice

## 2016-11-05 ENCOUNTER — Encounter (HOSPITAL_COMMUNITY): Payer: Self-pay | Admitting: *Deleted

## 2016-11-05 DIAGNOSIS — Z880 Allergy status to penicillin: Secondary | ICD-10-CM | POA: Diagnosis not present

## 2016-11-05 DIAGNOSIS — N3090 Cystitis, unspecified without hematuria: Secondary | ICD-10-CM | POA: Diagnosis not present

## 2016-11-05 DIAGNOSIS — R300 Dysuria: Secondary | ICD-10-CM | POA: Diagnosis present

## 2016-11-05 DIAGNOSIS — Z3202 Encounter for pregnancy test, result negative: Secondary | ICD-10-CM

## 2016-11-05 LAB — POCT URINALYSIS DIP (DEVICE)
BILIRUBIN URINE: NEGATIVE
Glucose, UA: NEGATIVE mg/dL
KETONES UR: NEGATIVE mg/dL
Nitrite: NEGATIVE
Protein, ur: NEGATIVE mg/dL
SPECIFIC GRAVITY, URINE: 1.025 (ref 1.005–1.030)
Urobilinogen, UA: 2 mg/dL — ABNORMAL HIGH (ref 0.0–1.0)
pH: 6.5 (ref 5.0–8.0)

## 2016-11-05 LAB — POCT PREGNANCY, URINE: Preg Test, Ur: NEGATIVE

## 2016-11-05 MED ORDER — NITROFURANTOIN MONOHYD MACRO 100 MG PO CAPS
100.0000 mg | ORAL_CAPSULE | Freq: Two times a day (BID) | ORAL | 0 refills | Status: DC
Start: 2016-11-05 — End: 2017-10-02

## 2016-11-05 NOTE — Telephone Encounter (Signed)
Patient called and left message stating she hasn't been in for her postpartum yet but wants to talk to a nurse about some symptoms she is having. Called patient, no answer- left message stating we are trying to reach you to return your phone call, please call us back

## 2016-11-05 NOTE — ED Triage Notes (Signed)
Pt  Reported  burining   On  Urination   With a  discloration    X   sev     Days   Feels  Similar  To  A  Previous   Daughter   Pt is  Breast  Feeding

## 2016-11-05 NOTE — Discharge Instructions (Signed)
Your urine was positive for an urinary tract infection. Start Macrobid as directed. Keep hydrated, your urine should be clear to pale yellow in color. Monitor for any worsening of symptoms, fever, worsening abdominal pain, nausea/vomiting, flank pain, follow up for reevaluation.

## 2016-11-05 NOTE — ED Provider Notes (Signed)
[facility]    CSN: [id number] Arrival date & time: 11/05/16  1504     History   Chief Complaint Chief Complaint  Patient presents with  . Urinary Tract Infection    HPI Jennifer Dunlap is a 36 y.o. female.   36 year old female with history of benign tumor of the thyroid gland, fibroid, comes in for 1 day history of dysuria, few day history of urine discoloration. Patient states symptoms similar to when she had UTIs in the past. Denies fever, chills, night sweats. Denies abdominal pain, nausea, vomiting, diarrhea. Patient with recent vaginal delivery, and is currently breast-feeding. She continues to have some vaginal bleeding since delivery. She has a follow-up with OB/GYN in a few weeks. Otherwise no vaginal irritation, discharge.       Past Medical History:  Diagnosis Date  . Benign tumor of thyroid gland   . Fibroid   . Pericarditis 2011    Patient Active Problem List   Diagnosis Date Noted  . Group B Streptococcus carrier, +RV culture, currently pregnant 09/12/2016  . Unwanted fertility 08/27/2016  . FH: ovarian cancer in first degree relative 08/16/2016  . [place] multiparity with antenatal problem 06/18/2016  . Supervision of high risk pregnancy, antepartum 03/22/2016  . History of bilateral tubal ligation 02/16/2016  . 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 History of maternal pericarditis 02/20/2012    Past Surgical History:  Procedure Laterality Date  . LAPAROSCOPIC TUBAL LIGATION Bilateral 10/12/2014   Procedure: LAPAROSCOPIC TUBAL LIGATION;  Surgeon: Woodroe Mode, MD;  Location: [facility];  Service: Gynecology;  Laterality: Bilateral;    OB History    Gravida Para Term Preterm AB Living   10 8 8  0 2 8   SAB TAB Ectopic Multiple Live Births   0 2 0 0 8       Home Medications    Prior to Admission medications   Medication Sig Start Date End Date Taking? Authorizing Provider  ibuprofen (ADVIL,MOTRIN) 600 MG tablet Take 1 tablet (600 mg total) by mouth  every 6 (six) hours. 10/09/16   Emily Filbert, MD  nitrofurantoin, macrocrystal-monohydrate, (MACROBID) 100 MG capsule Take 1 capsule (100 mg total) by mouth 2 (two) times daily. 11/05/16   Ok Edwards, PA-C  Pediatric Multivit-Minerals-C (FLINTSTONES GUMMIES PO) Take 3 each by mouth daily.     [provider]    Family History Family History  Problem Relation Age of Onset  . Heart disease Father   . Heart attack Father   . Stroke Father   . Cancer Sister 41       ovarian  . Hypertension Mother   . Heart attack Paternal Grandmother     Social History Social History  Substance Use Topics  . Smoking status: Never Smoker  . Smokeless tobacco: Never Used  . Alcohol use No     Comment: occcasional     Allergies   Penicillins   Review of Systems Review of Systems  Reason unable to perform ROS: See HPI as above.     Physical Exam Triage Vital Signs ED Triage Vitals [11/05/16 1616]  Enc Vitals Group     BP 110/70     Pulse Rate 78     Resp 18     Temp 98.6 F (37 C)     Temp Source Oral     SpO2 100 %     Weight      Height      Head Circumference  Peak Flow      Pain Score      Pain Loc      Pain Edu?      Excl. in [place]?    No data found.   Updated Vital Signs BP 110/70 (BP Location: Right Arm)   Pulse 78   Temp 98.6 F (37 C) (Oral)   Resp 18   SpO2 100%   Breastfeeding? Yes   Physical Exam  Constitutional: She is oriented to person, place, and time. She appears well-developed and well-nourished. No distress.  Eyes: Pupils are equal, round, and reactive to light. Conjunctivae are normal.  Cardiovascular: Normal rate, regular rhythm and normal heart sounds.  Exam reveals no gallop and no friction rub.   No murmur heard. Pulmonary/Chest: Effort normal and breath sounds normal. She has no wheezes. She has no rales.  Abdominal: Soft. Bowel sounds are normal. She exhibits no distension. There is no tenderness. There is no rebound, no guarding and  no CVA tenderness.  Neurological: She is alert and oriented to person, place, and time.  Skin: Skin is warm and dry.     UC Treatments / Results  Labs (all labs ordered are listed, but only abnormal results are displayed) Labs Reviewed  POCT URINALYSIS DIP (DEVICE) - Abnormal; Notable for the following:       Result Value   Hgb urine dipstick MODERATE (*)    Urobilinogen, UA 2.0 (*)    Leukocytes, UA TRACE (*)    All other components within normal limits  URINE CULTURE  POCT PREGNANCY, URINE    EKG  EKG Interpretation None       Radiology No results found.  Procedures Procedures (including critical care time)  Medications Ordered in UC Medications - No data to display   Initial Impression / Assessment and Plan / UC Course  I have reviewed the triage vital signs and the nursing notes.  Pertinent labs & imaging results that were available during my care of the patient were reviewed by me and considered in my medical decision making (see chart for details).    Urine dipstick positive for UTI. Keflex avoided due to anaphylaxis reaction to PCN. Start Macrobid as directed. Urine culture ordered. Push fluids. Return precautions given.  Final Clinical Impressions(s) / UC Diagnoses   Final diagnoses:  Cystitis    New Prescriptions Discharge Medication List as of 11/05/2016  5:09 PM    START taking these medications   Details  nitrofurantoin, macrocrystal-monohydrate, (MACROBID) 100 MG capsule Take 1 capsule (100 mg total) by mouth 2 (two) times daily., Starting Mon 11/05/2016, Normal          Cathlean Sauer V, PA-C 11/05/16 1714

## 2016-11-06 NOTE — Telephone Encounter (Signed)
Mychart message to patient asking for more details on  Her concerns.

## 2016-11-07 LAB — URINE CULTURE

## 2016-11-09 NOTE — Pre-Procedure Instructions (Signed)
Jennifer Dunlap  11/09/2016      [facility] - [address], [address] AT [address] [place] [address] Phone: [phone] Fax: [phone]    Your procedure is scheduled on November 14, 2016.  Report to [facility] Admitting at 800 AM.  Call this number if you have problems the morning of surgery:  [phone]   Remember:  Do not eat food or drink liquids after midnight.  Take these medicines the morning of surgery with A SIP OF WATER macrobid (nitrofurantoin).  7 days prior to surgery STOP taking any Aspirin, Aleve, Naproxen, Ibuprofen, Motrin, Advil, Goody's, BC's, all herbal medications, fish oil, and all vitamins   Do not wear jewelry, make-up or nail polish.  Do not wear lotions, powders, or perfumes, or deoderant.  Do not shave 48 hours prior to surgery.   Do not bring valuables to the hospital.  [facility] is not responsible for any belongings or valuables.  Contacts, dentures or bridgework may not be worn into surgery.  Leave your suitcase in the car.  After surgery it may be brought to your room.  For patients admitted to the hospital, discharge time will be determined by your treatment team.  Patients discharged the day of surgery will not be allowed to drive home.    Special instructions:   [facility]- Preparing For Surgery  Before surgery, you can play an important role. Because skin is not sterile, your skin needs to be as free of germs as possible. You can reduce the number of germs on your skin by washing with CHG (chlorahexidine gluconate) Soap before surgery.  CHG is an antiseptic cleaner which kills germs and bonds with the skin to continue killing germs even after washing.  Please do not use if you have an allergy to CHG or antibacterial soaps. If your skin becomes reddened/irritated stop using the CHG.  Do not shave (including legs and underarms) for at least 48 hours prior  to first CHG shower. It is OK to shave your face.  Please follow these instructions carefully.   1. Shower the NIGHT BEFORE SURGERY and the MORNING OF SURGERY with CHG.   2. If you chose to wash your hair, wash your hair first as usual with your normal shampoo.  3. After you shampoo, rinse your hair and body thoroughly to remove the shampoo.  4. Use CHG as you would any other liquid soap. You can apply CHG directly to the skin and wash gently with a scrungie or a clean washcloth.   5. Apply the CHG Soap to your body ONLY FROM THE NECK DOWN.  Do not use on open wounds or open sores. Avoid contact with your eyes, ears, mouth and genitals (private parts). Wash genitals (private parts) with your normal soap.  6. Wash thoroughly, paying special attention to the area where your surgery will be performed.  7. Thoroughly rinse your body with warm water from the neck down.  8. DO NOT shower/wash with your normal soap after using and rinsing off the CHG Soap.  9. Pat yourself dry with a CLEAN TOWEL.   10. Wear CLEAN PAJAMAS   11. Place CLEAN SHEETS on your bed the night of your first shower and DO NOT SLEEP WITH PETS.    Day of Surgery: Do not apply any deodorants/lotions. Please wear clean clothes to the hospital/surgery center.     Please  read over the following fact sheets that you were given. Pain Booklet, Coughing and Deep Breathing and Surgical Site Infection Prevention

## 2016-11-12 ENCOUNTER — Ambulatory Visit (HOSPITAL_COMMUNITY)
Admission: RE | Admit: 2016-11-12 | Discharge: 2016-11-12 | Disposition: A | Discharge status: Home / Self Care | Payer: Medicaid Other | Source: Ambulatory Visit | Attending: Otolaryngology | Admitting: Otolaryngology

## 2016-11-12 ENCOUNTER — Encounter (HOSPITAL_COMMUNITY)
Admission: RE | Admit: 2016-11-12 | Discharge: 2016-11-12 | Disposition: A | Discharge status: Home / Self Care | Payer: Medicaid Other | Source: Ambulatory Visit | Attending: Otolaryngology | Admitting: Otolaryngology

## 2016-11-12 ENCOUNTER — Encounter (HOSPITAL_COMMUNITY): Payer: Self-pay

## 2016-11-12 DIAGNOSIS — Z01818 Encounter for other preprocedural examination: Secondary | ICD-10-CM | POA: Diagnosis present

## 2016-11-12 DIAGNOSIS — R9431 Abnormal electrocardiogram [ECG] [EKG]: Secondary | ICD-10-CM | POA: Diagnosis not present

## 2016-11-12 DIAGNOSIS — E039 Hypothyroidism, unspecified: Secondary | ICD-10-CM

## 2016-11-12 LAB — BASIC METABOLIC PANEL
Anion gap: 7 (ref 5–15)
BUN: 15 mg/dL (ref 6–20)
CO2: 24 mmol/L (ref 22–32)
CREATININE: 0.66 mg/dL (ref 0.44–1.00)
Calcium: 10 mg/dL (ref 8.9–10.3)
Chloride: 106 mmol/L (ref 101–111)
GFR calc Af Amer: >60 mL/min (ref >60)
Glucose, Bld: 90 mg/dL (ref 65–99)
Potassium: 4.8 mmol/L (ref 3.5–5.1)
SODIUM: 137 mmol/L (ref 135–145)

## 2016-11-12 LAB — CBC
HEMATOCRIT: 42 % (ref 36.0–46.0)
HEMOGLOBIN: 13 g/dL (ref 12.0–15.0)
MCH: 25.8 pg — AB (ref 26.0–34.0)
MCHC: 31 g/dL (ref 30.0–36.0)
MCV: 83.3 fL (ref 78.0–100.0)
PLATELETS: 201 10*3/uL (ref 150–400)
RBC: 5.04 MIL/uL (ref 3.87–5.11)
RDW: 14.5 % (ref 11.5–15.5)
WBC: 6.5 10*3/uL (ref 4.0–10.5)

## 2016-11-12 LAB — HCG, QUANTITATIVE, PREGNANCY: hCG, Beta Chain, Quant, S: 2 m[IU]/mL (ref <5)

## 2016-11-12 LAB — HCG, SERUM, QUALITATIVE: PREG SERUM: POSITIVE — AB

## 2016-11-12 NOTE — Telephone Encounter (Signed)
Patient is scheduled for a postpartum visit tomorrow, 10/2.

## 2016-11-13 ENCOUNTER — Ambulatory Visit: Payer: Medicaid Other | Admitting: Student

## 2016-11-13 NOTE — Progress Notes (Signed)
Anesthesia Chart Review: Patient is a 36 year old female scheduled for right thyroidectomy on 11/14/16 by Dr. Jerrell Belfast. She is s/p vaginal delivery of term female infant on 10/09/16. She is currently breastfeeding. She had known right thyroid nodules which showed "significant enlargement" with imaging done during pregnancy. By notes, FNA was negative for malignancy, but due to continued enlargement of right thyroid nodules a right hemithyroidectomy was recommended.   Other history includes never smoker, pericarditis ~ '11, fibroid, thyroid nodule (right, benign), tubal ligation 10/12/14.  PCP is listed as being with [place]. GYN is with [facility]. She is not followed routinely by a cardiologist, but saw Dr. Percival Spanish during a pregnancy in 2014. 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 Post-partum echo 06/2012 also showed EF 55-60%. 03/2016 echo peri-partum showed EF 55-60%.  BP 114/71   Pulse 88   Temp 36.6 C   Resp 18   Ht 5' 3.75" (1.619 m)   Wt 156 lb 1.6 oz (70.8 kg)   SpO2 95%   Breastfeeding? Yes   BMI 27.01 kg/m  EKG tracing showed HR of 41. She had EKGs showing bradycardia with a HR in the 50's in 2013.   EKG 11/12/16: SB at 41 bpm. Non-specific T wave abnormality.   Echocardiogram 04/06/16: Study Conclusions - Left ventricle: The cavity size was normal. Wall thickness was   normal. Systolic function was normal. The estimated ejection   fraction was in the range of 55% to 60%. Wall motion was normal;   there were no regional wall motion abnormalities. Left   ventricular diastolic function parameters were normal. - Aortic valve: Valve area (VTI): 3.96 cm^2. Valve area (Vmax): 3.3   cm^2. Valve area (Vmean): 3.08 cm^2. Impressions: - Normal LV systolic and diastolic function; trace TR.  CXR 11/12/16:  FINDINGS: The heart size and mediastinal contours are within normal limits. Both lungs are clear. The visualized skeletal structures are unremarkable. IMPRESSION: No active cardiopulmonary disease.  Preoperative labs noted. BMET WNL. H/H 13.0/42.0. Qualitative serum pregnancy was weakly positive (post-partum 10/09/16), but quanitative was 2, consistent with a non-pregnant female. TSH, free T4, Triiodothyronine WNL on 08/16/16.   She will get vitals on arrival to re-check HR. Has had intermittent bradycardia in the past. EF normal in 03/2016. She is breastfeeding her newborn infant.   [facility] Short Stay Center/Anesthesiology Phone [phone] 11/13/2016 11:11 AM

## 2016-11-14 ENCOUNTER — Ambulatory Visit (HOSPITAL_COMMUNITY): Payer: Medicaid Other | Admitting: Vascular Surgery

## 2016-11-14 ENCOUNTER — Encounter (HOSPITAL_COMMUNITY)
Admission: RE | Disposition: A | Discharge status: Home / Self Care | Payer: Self-pay | Source: Ambulatory Visit | Attending: Otolaryngology

## 2016-11-14 ENCOUNTER — Encounter (HOSPITAL_COMMUNITY): Payer: Self-pay

## 2016-11-14 ENCOUNTER — Ambulatory Visit (HOSPITAL_COMMUNITY): Payer: Medicaid Other | Admitting: Certified Registered Nurse Anesthetist

## 2016-11-14 ENCOUNTER — Ambulatory Visit (HOSPITAL_COMMUNITY)
Admission: RE | Admit: 2016-11-14 | Discharge: 2016-11-15 | Disposition: A | Discharge status: Home / Self Care | Payer: Medicaid Other | Source: Ambulatory Visit | Attending: Otolaryngology | Admitting: Otolaryngology

## 2016-11-14 DIAGNOSIS — E042 Nontoxic multinodular goiter: Secondary | ICD-10-CM | POA: Insufficient documentation

## 2016-11-14 DIAGNOSIS — E079 Disorder of thyroid, unspecified: Secondary | ICD-10-CM | POA: Diagnosis present

## 2016-11-14 HISTORY — PX: THYROIDECTOMY: SHX17

## 2016-11-14 LAB — CBC
HCT: 39.2 % (ref 36.0–46.0)
HEMOGLOBIN: 12.5 g/dL (ref 12.0–15.0)
MCH: 26.4 pg (ref 26.0–34.0)
MCHC: 31.9 g/dL (ref 30.0–36.0)
MCV: 82.7 fL (ref 78.0–100.0)
PLATELETS: 166 10*3/uL (ref 150–400)
RBC: 4.74 MIL/uL (ref 3.87–5.11)
RDW: 14.3 % (ref 11.5–15.5)
WBC: 9.2 10*3/uL (ref 4.0–10.5)

## 2016-11-14 LAB — CREATININE, SERUM
CREATININE: 0.74 mg/dL (ref 0.44–1.00)
GFR calc non Af Amer: >60 mL/min (ref >60)

## 2016-11-14 SURGERY — THYROIDECTOMY
Anesthesia: General | Laterality: Right

## 2016-11-14 MED ORDER — LACTATED RINGERS IV SOLN
INTRAVENOUS | Status: DC
  Administered 2016-11-14: 09:00:00 via INTRAVENOUS

## 2016-11-14 MED ORDER — BACITRACIN ZINC 500 UNIT/GM EX OINT
TOPICAL_OINTMENT | CUTANEOUS | Status: AC
  Filled 2016-11-14: qty 28.35

## 2016-11-14 MED ORDER — LIDOCAINE 2% (20 MG/ML) 5 ML SYRINGE
INTRAMUSCULAR | Status: DC | PRN
  Administered 2016-11-14: 100 mg via INTRAVENOUS

## 2016-11-14 MED ORDER — ONDANSETRON HCL 4 MG/2ML IJ SOLN: 4.0000 mg | Freq: Once | INTRAMUSCULAR | Status: DC | PRN

## 2016-11-14 MED ORDER — DEXTROSE IN LACTATED RINGERS 5 % IV SOLN
INTRAVENOUS | Status: DC
  Administered 2016-11-14 – 2016-11-15 (×2): via INTRAVENOUS

## 2016-11-14 MED ORDER — SUCCINYLCHOLINE CHLORIDE 200 MG/10ML IV SOSY
PREFILLED_SYRINGE | INTRAVENOUS | Status: AC
  Filled 2016-11-14: qty 20

## 2016-11-14 MED ORDER — ROCURONIUM BROMIDE 10 MG/ML (PF) SYRINGE
PREFILLED_SYRINGE | INTRAVENOUS | Status: DC | PRN
  Administered 2016-11-14: 20 mg via INTRAVENOUS
  Administered 2016-11-14: 50 mg via INTRAVENOUS

## 2016-11-14 MED ORDER — ONDANSETRON HCL 4 MG PO TABS: 4.0000 mg | ORAL_TABLET | ORAL | Status: DC | PRN

## 2016-11-14 MED ORDER — MORPHINE SULFATE (PF) 4 MG/ML IV SOLN
2.0000 mg | INTRAVENOUS | Status: DC | PRN
  Administered 2016-11-15: 2 mg via INTRAVENOUS
  Administered 2016-11-15: 4 mg via INTRAVENOUS
  Filled 2016-11-14 (×2): qty 1

## 2016-11-14 MED ORDER — ACETAMINOPHEN 160 MG/5ML PO SOLN: 650.0000 mg | ORAL | Status: DC | PRN

## 2016-11-14 MED ORDER — LIDOCAINE 2% (20 MG/ML) 5 ML SYRINGE
INTRAMUSCULAR | Status: AC
  Filled 2016-11-14: qty 10

## 2016-11-14 MED ORDER — MEPERIDINE HCL 25 MG/ML IJ SOLN: 6.2500 mg | INTRAMUSCULAR | Status: DC | PRN

## 2016-11-14 MED ORDER — LIDOCAINE-EPINEPHRINE 1 %-1:100000 IJ SOLN
INTRAMUSCULAR | Status: AC
  Filled 2016-11-14: qty 1

## 2016-11-14 MED ORDER — SUGAMMADEX SODIUM 200 MG/2ML IV SOLN
INTRAVENOUS | Status: AC
  Filled 2016-11-14: qty 2

## 2016-11-14 MED ORDER — CIPROFLOXACIN IN D5W 400 MG/200ML IV SOLN
INTRAVENOUS | Status: AC
  Filled 2016-11-14: qty 200

## 2016-11-14 MED ORDER — HYDROCODONE-ACETAMINOPHEN 5-325 MG PO TABS
1.0000 | ORAL_TABLET | ORAL | Status: DC | PRN
  Administered 2016-11-14 – 2016-11-15 (×4): 2 via ORAL
  Filled 2016-11-14 (×4): qty 2

## 2016-11-14 MED ORDER — EPHEDRINE 5 MG/ML INJ
INTRAVENOUS | Status: AC
  Filled 2016-11-14: qty 20

## 2016-11-14 MED ORDER — PROPOFOL 10 MG/ML IV BOLUS
INTRAVENOUS | Status: DC | PRN
  Administered 2016-11-14: 150 mg via INTRAVENOUS

## 2016-11-14 MED ORDER — FENTANYL CITRATE (PF) 100 MCG/2ML IJ SOLN
25.0000 ug | INTRAMUSCULAR | Status: DC | PRN
  Administered 2016-11-14 (×4): 25 ug via INTRAVENOUS

## 2016-11-14 MED ORDER — CHLORHEXIDINE GLUCONATE CLOTH 2 % EX PADS: 6.0000 | MEDICATED_PAD | Freq: Once | CUTANEOUS | Status: DC

## 2016-11-14 MED ORDER — CYCLOBENZAPRINE HCL 5 MG PO TABS
5.0000 mg | ORAL_TABLET | Freq: Three times a day (TID) | ORAL | Status: DC | PRN
  Administered 2016-11-14 – 2016-11-15 (×3): 5 mg via ORAL
  Filled 2016-11-14 (×3): qty 1

## 2016-11-14 MED ORDER — MIDAZOLAM HCL 2 MG/2ML IJ SOLN
INTRAMUSCULAR | Status: DC | PRN
  Administered 2016-11-14 (×2): 1 mg via INTRAVENOUS

## 2016-11-14 MED ORDER — MIDAZOLAM HCL 2 MG/2ML IJ SOLN
INTRAMUSCULAR | Status: AC
  Filled 2016-11-14: qty 2

## 2016-11-14 MED ORDER — HEPARIN SODIUM (PORCINE) 5000 UNIT/ML IJ SOLN
5000.0000 [IU] | Freq: Three times a day (TID) | INTRAMUSCULAR | Status: DC
  Administered 2016-11-14 – 2016-11-15 (×2): 5000 [IU] via SUBCUTANEOUS
  Filled 2016-11-14 (×2): qty 1

## 2016-11-14 MED ORDER — FENTANYL CITRATE (PF) 250 MCG/5ML IJ SOLN
INTRAMUSCULAR | Status: DC | PRN
Start: 2016-11-14 — End: 2016-11-14
  Administered 2016-11-14: 100 ug via INTRAVENOUS
  Administered 2016-11-14 (×3): 50 ug via INTRAVENOUS

## 2016-11-14 MED ORDER — IBUPROFEN 600 MG PO TABS
600.0000 mg | ORAL_TABLET | Freq: Three times a day (TID) | ORAL | Status: DC | PRN
  Administered 2016-11-14: 600 mg via ORAL
  Filled 2016-11-14: qty 1

## 2016-11-14 MED ORDER — ROCURONIUM BROMIDE 10 MG/ML (PF) SYRINGE
PREFILLED_SYRINGE | INTRAVENOUS | Status: AC
  Filled 2016-11-14: qty 10

## 2016-11-14 MED ORDER — FENTANYL CITRATE (PF) 100 MCG/2ML IJ SOLN
INTRAMUSCULAR | Status: AC
  Filled 2016-11-14: qty 2

## 2016-11-14 MED ORDER — ONDANSETRON HCL 4 MG/2ML IJ SOLN
INTRAMUSCULAR | Status: DC | PRN
  Administered 2016-11-14: 4 mg via INTRAVENOUS

## 2016-11-14 MED ORDER — PHENYLEPHRINE 40 MCG/ML (10ML) SYRINGE FOR IV PUSH (FOR BLOOD PRESSURE SUPPORT)
PREFILLED_SYRINGE | INTRAVENOUS | Status: AC
  Filled 2016-11-14: qty 10

## 2016-11-14 MED ORDER — CIPROFLOXACIN IN D5W 400 MG/200ML IV SOLN
400.0000 mg | Freq: Once | INTRAVENOUS | Status: AC
  Administered 2016-11-14: 400 mg via INTRAVENOUS

## 2016-11-14 MED ORDER — ONDANSETRON HCL 4 MG/2ML IJ SOLN: 4.0000 mg | INTRAMUSCULAR | Status: DC | PRN

## 2016-11-14 MED ORDER — HYDROCODONE-ACETAMINOPHEN 5-325 MG PO TABS: 1.0000 | ORAL_TABLET | Freq: Four times a day (QID) | ORAL | 0 refills | Status: DC | PRN

## 2016-11-14 MED ORDER — EPHEDRINE SULFATE-NACL 50-0.9 MG/10ML-% IV SOSY
PREFILLED_SYRINGE | INTRAVENOUS | Status: DC | PRN
  Administered 2016-11-14: 10 mg via INTRAVENOUS

## 2016-11-14 MED ORDER — DEXAMETHASONE SODIUM PHOSPHATE 10 MG/ML IJ SOLN
INTRAMUSCULAR | Status: AC
  Filled 2016-11-14: qty 1

## 2016-11-14 MED ORDER — ACETAMINOPHEN 650 MG RE SUPP: 650.0000 mg | RECTAL | Status: DC | PRN

## 2016-11-14 MED ORDER — FENTANYL CITRATE (PF) 250 MCG/5ML IJ SOLN
INTRAMUSCULAR | Status: AC
  Filled 2016-11-14: qty 5

## 2016-11-14 MED ORDER — LIDOCAINE-EPINEPHRINE 1 %-1:100000 IJ SOLN
INTRAMUSCULAR | Status: DC | PRN
  Administered 2016-11-14: 2 mL

## 2016-11-14 MED ORDER — SUGAMMADEX SODIUM 200 MG/2ML IV SOLN
INTRAVENOUS | Status: DC | PRN
Start: 2016-11-14 — End: 2016-11-14
  Administered 2016-11-14: 140 mg via INTRAVENOUS

## 2016-11-14 MED ORDER — ONDANSETRON HCL 4 MG/2ML IJ SOLN
INTRAMUSCULAR | Status: AC
  Filled 2016-11-14: qty 4

## 2016-11-14 MED ORDER — DEXAMETHASONE SODIUM PHOSPHATE 10 MG/ML IJ SOLN
10.0000 mg | Freq: Once | INTRAMUSCULAR | Status: AC
  Administered 2016-11-14: 10 mg via INTRAVENOUS

## 2016-11-14 SURGICAL SUPPLY — 51 items
APPLIER CLIP 9.375 SM OPEN (CLIP)
ATTRACTOMAT 16X20 MAGNETIC DRP (DRAPES) IMPLANT
BLADE SURG 15 STRL LF DISP TIS (BLADE) IMPLANT
BLADE SURG 15 STRL SS (BLADE)
CANISTER SUCT 3000ML PPV (MISCELLANEOUS) ×3 IMPLANT
CLEANER TIP ELECTROSURG 2X2 (MISCELLANEOUS) ×3 IMPLANT
CLIP APPLIE 9.375 SM OPEN (CLIP) IMPLANT
CORDS BIPOLAR (ELECTRODE) ×3 IMPLANT
COVER SURGICAL LIGHT HANDLE (MISCELLANEOUS) ×3 IMPLANT
DERMABOND ADHESIVE PROPEN (GAUZE/BANDAGES/DRESSINGS) ×2
DERMABOND ADVANCED (GAUZE/BANDAGES/DRESSINGS) ×2
DERMABOND ADVANCED .7 DNX12 (GAUZE/BANDAGES/DRESSINGS) ×1 IMPLANT
DERMABOND ADVANCED .7 DNX6 (GAUZE/BANDAGES/DRESSINGS) ×1 IMPLANT
DRAIN HEMOVAC 7FR (DRAIN) ×3 IMPLANT
DRAIN SNY 10 ROU (WOUND CARE) IMPLANT
DRAPE HALF SHEET 40X57 (DRAPES) IMPLANT
ELECT COATED BLADE 2.86 ST (ELECTRODE) ×3 IMPLANT
ELECT REM PT RETURN 9FT ADLT (ELECTROSURGICAL) ×3
ELECTRODE REM PT RTRN 9FT ADLT (ELECTROSURGICAL) ×1 IMPLANT
EVACUATOR SILICONE 100CC (DRAIN) ×3 IMPLANT
GAUZE SPONGE 4X4 16PLY XRAY LF (GAUZE/BANDAGES/DRESSINGS) ×3 IMPLANT
GLOVE BIOGEL M 7.0 STRL (GLOVE) ×3 IMPLANT
GLOVE BIOGEL PI IND STRL 6.5 (GLOVE) ×2 IMPLANT
GLOVE BIOGEL PI INDICATOR 6.5 (GLOVE) ×4
GOWN STRL REUS W/ TWL LRG LVL3 (GOWN DISPOSABLE) ×3 IMPLANT
GOWN STRL REUS W/TWL LRG LVL3 (GOWN DISPOSABLE) ×6
HEMOSTAT SURGICEL 2X4 FIBR (HEMOSTASIS) ×3 IMPLANT
KIT BASIN OR (CUSTOM PROCEDURE TRAY) ×3 IMPLANT
KIT ROOM TURNOVER OR (KITS) ×3 IMPLANT
MARKER SKIN DUAL TIP RULER LAB (MISCELLANEOUS) IMPLANT
NEEDLE HYPO 25GX1X1/2 BEV (NEEDLE) IMPLANT
NEEDLE PRECISIONGLIDE 27X1.5 (NEEDLE) ×3 IMPLANT
NS IRRIG 1000ML POUR BTL (IV SOLUTION) ×3 IMPLANT
PAD ARMBOARD 7.5X6 YLW CONV (MISCELLANEOUS) ×6 IMPLANT
PENCIL BUTTON HOLSTER BLD 10FT (ELECTRODE) ×3 IMPLANT
SHEARS HARMONIC 9CM CVD (BLADE) ×3 IMPLANT
SPONGE INTESTINAL PEANUT (DISPOSABLE) ×3 IMPLANT
STAPLER VISISTAT 35W (STAPLE) ×3 IMPLANT
SUT CHROMIC 2 0 SH (SUTURE) IMPLANT
SUT ETHILON 3 0 PS 1 (SUTURE) ×6 IMPLANT
SUT ETHILON 5 0 PS 2 18 (SUTURE) IMPLANT
SUT SILK 2 0 REEL (SUTURE) IMPLANT
SUT SILK 2 0 SH CR/8 (SUTURE) ×3 IMPLANT
SUT SILK 3 0 REEL (SUTURE) ×3 IMPLANT
SUT SILK 4 0 REEL (SUTURE) IMPLANT
SUT VIC AB 3-0 SH 27 (SUTURE) ×2
SUT VIC AB 3-0 SH 27X BRD (SUTURE) ×1 IMPLANT
SUT VIC AB 4-0 PS2 18 (SUTURE) ×3 IMPLANT
SUT VICRYL 4-0 PS2 18IN ABS (SUTURE) IMPLANT
TOWEL OR 17X24 6PK STRL BLUE (TOWEL DISPOSABLE) ×3 IMPLANT
TRAY ENT MC OR (CUSTOM PROCEDURE TRAY) ×3 IMPLANT

## 2016-11-14 NOTE — Anesthesia Postprocedure Evaluation (Signed)
Anesthesia Post Note  Patient: Jennifer Dunlap  Procedure(s) Performed: RIGHT THYROIDECTOMY (Right )     Patient location during evaluation: PACU Anesthesia Type: General Level of consciousness: awake and alert Pain management: pain level controlled Vital Signs Assessment: post-procedure vital signs reviewed and stable Respiratory status: spontaneous breathing, nonlabored ventilation, respiratory function stable and patient connected to nasal cannula oxygen Cardiovascular status: blood pressure returned to baseline and stable Postop Assessment: no apparent nausea or vomiting Anesthetic complications: no    Last Vitals:  Vitals:   11/14/16 1346 11/14/16 1406  BP:  112/89  Pulse:  60  Resp:  16  Temp: 36.6 C 36.7 C  SpO2:  99%    Last Pain:  Vitals:   11/14/16 1406  TempSrc: Oral  PainSc:                  [name]

## 2016-11-14 NOTE — Op Note (Signed)
NAMECHAE, SHUSTER                ACCOUNT NO.:  [id number]  MEDICAL RECORD NO.:  [id number]  LOCATION:                               FACILITY:  [facility]  PHYSICIAN:  Early Chars. Wilburn Cornelia, M.D.DATE OF BIRTH:  03/04/1980  DATE OF PROCEDURE:  11/14/2016 DATE OF DISCHARGE:  11/15/2016                              OPERATIVE REPORT   LOCATION:  [facility] Main OR.  PREOPERATIVE DIAGNOSIS:  Right thyroid mass.  POSTOPERATIVE DIAGNOSIS:  Right thyroid mass.  INDICATION FOR SURGERY:  Right thyroid mass.  PROCEDURE:  Right thyroidectomy.  ANESTHESIA:  General endotracheal.  SURGEON:  Early Chars. Wilburn Cornelia, M.D.  ASSISTANT:  Jolene Provost, PA-C.  COMPLICATIONS:  None.  ESTIMATED BLOOD LOSS:  Less than 100 mL.  DISPOSITION:  The patient transferred from the operating room to recovery room in stable condition.  BRIEF HISTORY:  The patient is a 36 year old black female, who presented with enlarging mass in the right inferior neck consistent with a thyroid nodule.  The patient was pregnant on initial evaluation and was referred by her OB/GYN for further workup.  A fine-needle aspiration of the mass was performed under ultrasound guidance and pathology was consistent with a benign multinodular goiter.  Given the patient's history and findings, we deferred surgical intervention until after completion of her successful pregnancy.  She was seen in the office and found to have a large nodular mass involving the right inferior neck, which has gradually enlarged over several year period of time.  Given history and findings, I recommended right thyroidectomy.  The risks and benefits were discussed in detail with the patient, who understood and agreed with our plan for surgery, which is scheduled under general anesthesia at [facility] with overnight observation.  DESCRIPTION OF PROCEDURE:  The patient was brought to the operating room on November 14, 2016, placed in supine  position on the operating table. General endotracheal anesthesia was established without difficulty. When the patient was adequately anesthetized, she was positioned on the operating table and then prepped and draped.  A surgical time-out was then performed with correct identification of the patient and the surgical procedure including the right laterality of her mass.  Her skin was then injected with a total of 2 mL of 1% lidocaine with 1:100,000 dilution epinephrine, which was injected in subcutaneous fashion along the proposed incision line.  With the patient prepped, draped, and prepared for surgery, a 4 cm horizontally oriented skin incision was created in a pre-existing skin crease.  This was carried through the skin and underlying subcutaneous tissue.  Superior and inferior flaps were carefully elevated.  The patient had a very large nodular thyroid mass, which deviated the trachea to the left.  The midline was significantly leftward with direct visualization.  The midline structures including the strap muscles and the trachea were palpable and identified.  The strap muscles were divided and lateralized.  On the right-hand side, the strap muscles were lateralized fully to allow access to the anterior compartment of the neck and the large palpable nodular mass.  The thyroidectomy was begun with division of the thyroid isthmus using the Harmonic Scalpel. Dissection was then carefully undertaken along  the margin of the gland superiorly.  The superior thyroid vessels were divided and ligated with the Harmonic Scalpel.  The patient had a very large goiter superior posterior mass, which was elevated and removed with the primary specimen.  Dissection was then carried along the lateral aspect of the gland carefully elevating soft tissue and dividing blood vessels.  The sternocleidomastoid muscle was elevated and retracted fully to allow access to the vascular compartment.  Carotid artery  and jugular vein were identified and preserved.  There was another large nodular mass inferiorly and lateral abutting the carotid artery.  This was carefully dissected and removed with the primary specimen.  Dissection was then carried along the inferior border of the gland, dividing the inferior thyroid vessels with the Harmonic Scalpel.  The gland was then reflected from lateral to medial.  The recurrent laryngeal nerve was identified. This was preserved throughout its course and one inferior parathyroid gland was identified and preserved.  The gland was then reflected further medially and was transected at its attachment to the anterior trachea.  The mass was then sent to Pathology for gross microscopic evaluation.  The patient's wound was carefully irrigated.  Hemostasis was maintained with bipolar cautery.  A small amount of fibular Surgicel was placed in the depth of the wound and a 7-French round drain was placed.  This was sutured to the skin with 3-0 Ethilon suture.  Closure was begun by reapproximating strap muscles with a single 3-0 Vicryl suture.  Deep subcutaneous tissue was closed with interrupted 3-0 Vicryl suture.  Immediate subcutaneous layer was closed with interrupted 4-0 Vicryl suture and the skin edge was closed with Dermabond surgical glue. The patient was then awakened from anesthetic.  She was extubated and transferred from the operating room to the recovery room in stable condition.  There were no complications and estimated blood loss was less than 100 mL.          ______________________________ Early Chars. Wilburn Cornelia, M.D.     DLS/MEDQ  D:  45/[id number]  T:  11/14/2016  Job:  [id number]

## 2016-11-14 NOTE — Anesthesia Preprocedure Evaluation (Signed)
Anesthesia Evaluation  Patient identified by MRN, date of birth, ID band Patient awake    Reviewed: Allergy & Precautions, H&P , Patient's Chart, lab work & pertinent test results, reviewed documented beta blocker date and time   Airway Mallampati: II  TM Distance: >3 FB Neck ROM: full    Dental no notable dental hx.    Pulmonary    Pulmonary exam normal breath sounds clear to auscultation       Cardiovascular  Rhythm:regular Rate:Normal     Neuro/Psych    GI/Hepatic   Endo/Other    Renal/GU      Musculoskeletal   Abdominal   Peds  Hematology   Anesthesia Other Findings 2cm right thyroid nodule  Echocardiogram 04/06/16: Study Conclusions - Left ventricle: The cavity size was normal. Wall thickness was normal. Systolic function was normal. The estimated ejection fraction was in the range of 55% to 60%. Wall motion was normal; there were no regional wall motion abnormalities. Left ventricular diastolic function parameters were normal. - Aortic valve: Valve area (VTI): 3.96 cm^2. Valve area (Vmax): 3.3 cm^2. Valve area (Vmean): 3.08 cm^2. Impressions: - Normal LV systolic and diastolic function; trace TR.  Reproductive/Obstetrics                             Anesthesia Physical  Anesthesia Plan  ASA: II  Anesthesia Plan: General   Post-op Pain Management:    Induction: Intravenous  PONV Risk Score and Plan: 2 and Ondansetron, Dexamethasone, Treatment may vary due to age or medical condition and Midazolam  Airway Management Planned: Oral ETT  Additional Equipment:   Intra-op Plan:   Post-operative Plan: Extubation in OR  Informed Consent: I have reviewed the patients History and Physical, chart, labs and discussed the procedure including the risks, benefits and alternatives for the proposed anesthesia with the patient or authorized representative who has indicated  his/her understanding and acceptance.   Dental Advisory Given and Dental advisory given  Plan Discussed with: CRNA and Surgeon  Anesthesia Plan Comments: (  )        Anesthesia Quick Evaluation

## 2016-11-14 NOTE — Progress Notes (Signed)
   ENT Progress Note: POD #1 s/p Procedure(s): RIGHT THYROIDECTOMY   Subjective: C/O severe Rt post-auricular and occipital H/A   Objective: Vital signs in last 24 hours: Temp:  [97.9 F (36.6 C)-98.4 F (36.9 C)] 98 F (36.7 C) (10/03 1406) Pulse Rate:  [50-69] 60 (10/03 1406) Resp:  [8-20] 16 (10/03 1406) BP: (112-149)/(74-100) 112/89 (10/03 1406) SpO2:  [96 %-99 %] 99 % (10/03 1406) Weight:  [70.8 kg (156 lb 1.6 oz)-70.9 kg (156 lb 4.9 oz)] 70.9 kg (156 lb 4.9 oz) (10/03 1406) Weight change:  Last BM Date: 11/12/16  Intake/Output from previous day: No intake/output data recorded. Intake/Output this shift: Total I/O In: 1100 [I.V.:1100] Out: 20 [Drains:10; Blood:10]  Labs:  Recent Labs  11/12/16 0945 11/14/16 1436  WBC 6.5 9.2  HGB 13.0 12.5  HCT 42.0 39.2  PLT 201 166    Recent Labs  11/12/16 0945  NA 137  K 4.8  CL 106  CO2 24  GLUCOSE 90  BUN 15  CALCIUM 10.0    Studies/Results: No results found.   PHYSICAL EXAM: Inc intact w/o swelling JP in place and functioning Good voice   Assessment/Plan: Pt stable postop C/O occipital h/a Rec motrin, warm compress and flexeril prn Monitor o/n.    [place], Irfan Veal 11/14/2016, 4:41 PM

## 2016-11-14 NOTE — Anesthesia Procedure Notes (Signed)
Procedure Name: Intubation Date/Time: 11/14/2016 10:25 AM Performed by: Julieta Bellini Pre-anesthesia Checklist: Patient identified, Emergency Drugs available, Suction available and Patient being monitored Patient Re-evaluated:Patient Re-evaluated prior to induction Oxygen Delivery Method: Circle system utilized Preoxygenation: Pre-oxygenation with 100% oxygen Induction Type: IV induction Ventilation: Mask ventilation without difficulty Laryngoscope Size: Mac and 3 Grade View: Grade I Tube type: Oral Tube size: 7.0 mm Number of attempts: 1 Airway Equipment and Method: Stylet Placement Confirmation: ETT inserted through vocal cords under direct vision,  positive ETCO2 and breath sounds checked- equal and bilateral Secured at: 21 cm Tube secured with: Tape Dental Injury: Teeth and Oropharynx as per pre-operative assessment

## 2016-11-14 NOTE — Progress Notes (Signed)
Pt c/o constant pressure to right side of neck. Medicated for pain but insists on MD being notified. MD paged

## 2016-11-14 NOTE — Op Note (Deleted)
  The note originally documented on this encounter has been moved the the encounter in which it belongs.  

## 2016-11-14 NOTE — H&P (Signed)
Jennifer Dunlap is an 36 y.o. female.   Chief Complaint: Right thyroid mass HPI: long hx of enlarging Right thyroid mass, Bx benign.  Past Medical History:  Diagnosis Date  . Benign tumor of thyroid gland   . Fibroid   . Pericarditis 2011    Past Surgical History:  Procedure Laterality Date  . LAPAROSCOPIC TUBAL LIGATION Bilateral 10/12/2014   Procedure: LAPAROSCOPIC TUBAL LIGATION;  Surgeon: Woodroe Mode, MD;  Location: [facility];  Service: Gynecology;  Laterality: Bilateral;    Family History  Problem Relation Age of Onset  . Heart disease Father   . Heart attack Father   . Stroke Father   . Cancer Sister 29       ovarian  . Hypertension Mother   . Heart attack Paternal Grandmother    Social History:  reports that she has never smoked. She has never used smokeless tobacco. She reports that she does not drink alcohol or use drugs.  Allergies:  Allergies  Allergen Reactions  . Penicillins Anaphylaxis and Other (See Comments)    PATIENT HAS HAD A PCN REACTION WITH IMMEDIATE RASH, FACIAL/TONGUE/THROAT SWELLING, SOB, OR LIGHTHEADEDNESS WITH HYPOTENSION:  #  #  #  YES  #  #  #   Has patient had a PCN reaction causing severe rash involving mucus membranes or skin necrosis: No Has patient had a PCN reaction that required hospitalization No Has patient had a PCN reaction occurring within the last 10 years: No     Medications Prior to Admission  Medication Sig Dispense Refill  . nitrofurantoin, macrocrystal-monohydrate, (MACROBID) 100 MG capsule Take 1 capsule (100 mg total) by mouth 2 (two) times daily. (Patient taking differently: Take 100 mg by mouth 2 (two) times daily. 10 DAY COURSE FOR UTI) 10 capsule 0  . ibuprofen (ADVIL,MOTRIN) 600 MG tablet Take 1 tablet (600 mg total) by mouth every 6 (six) hours. (Patient not taking: Reported on 11/08/2016) 30 tablet 0    No results found for this or any previous visit (from the past 48 hour(s)). No results found.  Review of Systems   Constitutional: Negative.   HENT: Negative.   Respiratory: Negative.   Cardiovascular: Negative.     Blood pressure 113/74, pulse (!) 56, temperature 98.4 F (36.9 C), resp. rate 18, weight 70.8 kg (156 lb 1.6 oz), SpO2 98 %, currently breastfeeding. Physical Exam  Constitutional: She appears well-developed and well-nourished.  HENT:  Right thyroid mass  Neck: Normal range of motion. Neck supple. Thyromegaly present.  Cardiovascular: Normal rate.   Respiratory: Effort normal.  GI: Soft.     Assessment/Plan Adm for Right Thyroidectomy under GA.   Sergio Zawislak, MD 11/14/2016, 10:12 AM

## 2016-11-14 NOTE — Transfer of Care (Signed)
Immediate Anesthesia Transfer of Care Note  Patient: Jennifer Dunlap  Procedure(s) Performed: RIGHT THYROIDECTOMY (Right )  Patient Location: PACU  Anesthesia Type:General  Level of Consciousness: alert , oriented, drowsy and patient cooperative  Airway & Oxygen Therapy: Patient Spontanous Breathing and Patient connected to nasal cannula oxygen  Post-op Assessment: Report given to RN, Post -op Vital signs reviewed and stable and Patient moving all extremities X 4  Post vital signs: Reviewed and stable  Last Vitals:  Vitals:   11/14/16 0853 11/14/16 1234  BP:    Pulse:    Resp:    Temp: 36.9 C (P) 36.6 C  SpO2:      Last Pain: There were no vitals filed for this visit.       Complications: No apparent anesthesia complications

## 2016-11-15 ENCOUNTER — Encounter (HOSPITAL_COMMUNITY): Payer: Self-pay | Admitting: Otolaryngology

## 2016-11-15 DIAGNOSIS — E042 Nontoxic multinodular goiter: Secondary | ICD-10-CM | POA: Diagnosis not present

## 2016-11-15 NOTE — Progress Notes (Signed)
Patient read AVS and confirmed understanding all information.  Escorted to personal vehicle via w/c

## 2016-11-15 NOTE — Discharge Summary (Signed)
Physician Discharge Summary  Patient ID: Mallissa Lorenzen MRN: [id number] DOB/AGE: 05/19/1980 36 y.o.  Admit date: 11/14/2016 Discharge date: 11/15/2016  Admission Diagnoses:  Principal Problem:   Thyroid mass   Discharge Diagnoses:  Same  Surgeries: Procedure(s): RIGHT THYROIDECTOMY on 11/14/2016   Consultants: None  Discharged Condition: Improved  Hospital Course: Hazelyn Kallen is an 36 y.o. female who was admitted 11/14/2016 with a diagnosis of Principal Problem:   Thyroid mass  and went to the operating room on 11/14/2016 and underwent the above named procedures.   Pt stable on POD #1, drain removed. Pt for d/c.  Recent vital signs:  Vitals:   11/15/16 0539 11/15/16 0956  BP: (!) 102/59 104/68  Pulse: 73 (!) 59  Resp: 16   Temp: 97.9 F (36.6 C) 97.8 F (36.6 C)  SpO2: 99% 99%    Recent laboratory studies:  Results for orders placed or performed during the hospital encounter of 11/14/16  CBC  Result Value Ref Range   WBC 9.2 4.0 - 10.5 K/uL   RBC 4.74 3.87 - 5.11 MIL/uL   Hemoglobin 12.5 12.0 - 15.0 g/dL   HCT 39.2 36.0 - 46.0 %   MCV 82.7 78.0 - 100.0 fL   MCH 26.4 26.0 - 34.0 pg   MCHC 31.9 30.0 - 36.0 g/dL   RDW 14.3 11.5 - 15.5 %   Platelets 166 150 - 400 K/uL  Creatinine, serum  Result Value Ref Range   Creatinine, Ser 0.74 0.44 - 1.00 mg/dL   GFR calc non Af Amer >60 >60 mL/min   GFR calc Af Amer >60 >60 mL/min    Discharge Medications:   Allergies as of 11/15/2016      Reactions   Penicillins Anaphylaxis, Other (See Comments)   PATIENT HAS HAD A PCN REACTION WITH IMMEDIATE RASH, FACIAL/TONGUE/THROAT SWELLING, SOB, OR LIGHTHEADEDNESS WITH HYPOTENSION:  #  #  #  YES  #  #  #   Has patient had a PCN reaction causing severe rash involving mucus membranes or skin necrosis: No Has patient had a PCN reaction that required hospitalization No Has patient had a PCN reaction occurring within the last 10 years: No      Medication List    TAKE these  medications   HYDROcodone-acetaminophen 5-325 MG tablet Commonly known as:  NORCO Take 1-2 tablets by mouth every 6 (six) hours as needed.   ibuprofen 600 MG tablet Commonly known as:  ADVIL,MOTRIN Take 1 tablet (600 mg total) by mouth every 6 (six) hours.   nitrofurantoin (macrocrystal-monohydrate) 100 MG capsule Commonly known as:  MACROBID Take 1 capsule (100 mg total) by mouth 2 (two) times daily. What changed:  additional instructions       Diagnostic Studies: Dg Chest 2 View  Result Date: 11/12/2016 CLINICAL DATA:  Preop thyroidectomy EXAM: CHEST  2 VIEW COMPARISON:  03/25/2013 FINDINGS: The heart size and mediastinal contours are within normal limits. Both lungs are clear. The visualized skeletal structures are unremarkable. IMPRESSION: No active cardiopulmonary disease. Electronically Signed   By: Kathreen Devoid   On: 11/12/2016 12:54    Disposition: 01-Home or Self Care  Discharge Instructions    Diet - low sodium heart healthy    Complete by:  As directed    Diet - low sodium heart healthy    Complete by:  As directed    Discharge instructions    Complete by:  As directed    1. Limited activity 2. Liquid and soft diet, advance  as tolerated 3. May bathe and shower day after surgery 4. Wound care - Gentle cleaning with soap and water 5. DO NOT APPLY ANY OINTMENT 6. Elevate Head of Bed   Increase activity slowly    Complete by:  As directed    Increase activity slowly    Complete by:  As directed       Follow-up Information    Jerrell Belfast, MD In 10 days.   Specialty:  Otolaryngology Contact information: [address] [place] [address] [phone]            Signed: Jerrell Belfast 11/15/2016, 11:43 AM

## 2016-11-15 NOTE — Progress Notes (Signed)
   ENT Progress Note: POD #1 s/p Procedure(s): RIGHT THYROIDECTOMY   Subjective: tol po, min pain  Objective: Vital signs in last 24 hours: Temp:  [97.8 F (36.6 C)-98.4 F (36.9 C)] 97.8 F (36.6 C) (10/04 0956) Pulse Rate:  [50-73] 59 (10/04 0956) Resp:  [8-20] 16 (10/04 0539) BP: (102-149)/(53-100) 104/68 (10/04 0956) SpO2:  [96 %-99 %] 99 % (10/04 0956) Weight:  [70.9 kg (156 lb 4.9 oz)] 70.9 kg (156 lb 4.9 oz) (10/03 1406) Weight change:  Last BM Date: 11/12/16  Intake/Output from previous day: 10/03 0701 - 10/04 0700 In: 3267.5 [P.O.:1020; I.V.:2242.5] Out: 1423 [Urine:1600; Drains:35; Blood:10] Intake/Output this shift: No intake/output data recorded.  Labs:  Recent Labs  11/14/16 1436  WBC 9.2  HGB 12.5  HCT 39.2  PLT 166   No results for input(s): NA, K, CL, CO2, GLUCOSE, BUN, CALCIUM in the last 72 hours.  Invalid input(s): CREATININR  Studies/Results: No results found.   PHYSICAL EXAM: Inc intact JP removed   Assessment/Plan: Pt stable  Plan d/c to home    Shahzaib Azevedo 11/15/2016, 11:42 AM

## 2016-11-20 ENCOUNTER — Encounter (HOSPITAL_COMMUNITY): Payer: Self-pay

## 2016-11-20 ENCOUNTER — Ambulatory Visit (INDEPENDENT_AMBULATORY_CARE_PROVIDER_SITE_OTHER): Payer: Medicaid Other | Admitting: Medical

## 2016-11-20 ENCOUNTER — Encounter: Payer: Self-pay | Admitting: Medical

## 2016-11-20 VITALS — BP 105/62 | HR 62 | Ht 63.0 in | Wt 160.7 lb

## 2016-11-20 DIAGNOSIS — Z3009 Encounter for other general counseling and advice on contraception: Secondary | ICD-10-CM

## 2016-11-20 NOTE — Patient Instructions (Signed)
Salpingectomy Salpingectomy, also called tubectomy, is the surgical removal of one of the fallopian tubes. The fallopian tubes are where eggs travel from the ovaries to the uterus. Removing one fallopian tube does not prevent you from becoming pregnant. It also does not cause problems with your menstrual periods. You may need a salpingectomy if you:  Have a fertilized egg that attaches to the fallopian tube (ectopic pregnancy), especially one that causes the tube to burst or tear (rupture).  Have an infected fallopian tube.  Have cancer of the fallopian tube or nearby organs.  Have had an ovary removed due to a cyst or tumor.  Have had your uterus removed.  There are three different methods that can be used for a salpingectomy:  Open. This method involves making one large incision in your abdomen.  Laparoscopic. This method involves using a thin, lighted tube with a tiny camera on the end (laparoscope) to help perform the procedure. The laparoscope will allow your surgeon to make several small incisions in the abdomen instead of a large incision.  Robot-assisted: This method involves using a computer to control surgical instruments that are attached to robotic arms.  Tell a health care provider about:  Any allergies you have.  All medicines you are taking, including vitamins, herbs, eye drops, creams, and over-the-counter medicines.  Any problems you or family members have had with anesthetic medicines.  Any blood disorders you have.  Any surgeries you have had.  Any medical conditions you have.  Whether you are pregnant or may be pregnant. What are the risks? Generally, this is a safe procedure. However, problems may occur, including:  Infection.  Bleeding.  Allergic reactions to medicines.  Damage to other structures or organs.  Blood clots in the legs or lungs.  What happens before the procedure? Staying hydrated Follow instructions from your health care provider  about hydration, which may include:  Up to 2 hours before the procedure - you may continue to drink clear liquids, such as water, clear fruit juice, black coffee, and plain tea.  Eating and drinking restrictions Follow instructions from your health care provider about eating and drinking, which may include:  8 hours before the procedure - stop eating heavy meals or foods such as meat, fried foods, or fatty foods.  6 hours before the procedure - stop eating light meals or foods, such as toast or cereal.  6 hours before the procedure - stop drinking milk or drinks that contain milk.  2 hours before the procedure - stop drinking clear liquids.  Medicines  Ask your health care provider about: ? Changing or stopping your regular medicines. This is especially important if you are taking diabetes medicines or blood thinners. ? Taking medicines such as aspirin and ibuprofen. These medicines can thin your blood. Do not take these medicines before your procedure if your health care provider instructs you not to.  You may be given antibiotic medicine to help prevent infection. General instructions  Do not smoke for at least 2 weeks before your procedure. If you need help quitting, ask your health care provider.  You may have an exam or tests, such as an electrocardiogram (ECG).  You may have a blood or urine sample taken.  Ask your health care provider: ? Whether you should stop removing hair from your surgical area. ? How your surgical site will be marked or identified.  You may be asked to shower with a germ-killing soap.  Plan to have someone take you home  from the hospital or clinic.  If you will be going home right after the procedure, plan to have someone with you for 24 hours. What happens during the procedure?  To reduce your risk of infection: ? Your health care team will wash or sanitize their hands. ? Hair may be removed from the surgical area. ? Your skin will be washed  with soap.  An IV tube will be inserted into one of your veins.  You will be given a medicine to make you fall asleep (general anesthetic). You may also be given a medicine to help you relax (sedative).  A thin tube (catheter) may be inserted through your urethra and into your bladder to drain urine during your procedure.  Depending on the type of procedure you are having, one incision or several small incisions will be made in your abdomen.  Your fallopian tube will be cut and removed from where it attaches to your uterus.  Your blood vessels will be clamped and tied to prevent excess bleeding.  The incision(s) in your abdomen will be closed with stitches (sutures), staples, or skin glue.  A bandage (dressing) may be placed over your incision(s). The procedure may vary among health care providers and hospitals. What happens after the procedure?  Your blood pressure, heart rate, breathing rate, and blood oxygen level will be monitored until the medicines you were given have worn off.  You may continue to receive fluids and medicines through an IV tube.  You may continue to have a catheter draining your urine.  You may have to wear compression stockings. These stockings help to prevent blood clots and reduce swelling in your legs.  You will be given pain medicine as needed.  Do not drive for 24 hours if you received a sedative. Summary  Salpingectomy is a surgical procedure to remove one of the fallopian tubes.  The procedure may be done with an open incision, with a laparoscope, or with computer-controlled instruments.  Depending on the type of procedure you are having, one incision or several small incisions will be made in your abdomen.  Your blood pressure, heart rate, breathing rate, and blood oxygen level will be monitored until the medicines you were given have worn off.  Plan to have someone take you home from the hospital or clinic. This information is not intended  to replace advice given to you by your health care provider. Make sure you discuss any questions you have with your health care provider. Document Released: 06/17/2008 Document Revised: 09/16/2015 Document Reviewed: 07/23/2012 Elsevier Interactive Patient Education  [employer].

## 2016-11-20 NOTE — Progress Notes (Signed)
Subjective:     Jennifer Dunlap is a 36 y.o. female who presents for a postpartum visit. She is 6 weeks postpartum following a spontaneous vaginal delivery. I have fully reviewed the prenatal and intrapartum course. The delivery was at 40 gestational weeks. Outcome: spontaneous vaginal delivery. Anesthesia: epidural. Postpartum course has been normal. Baby's course has been normal. Baby is feeding by both breast and bottle Dory Horn. Bleeding no bleeding. Bowel function is normal. Bladder function is normal. Patient is not sexually active. Contraception method is abstinence. Postpartum depression screening: negative.  The following portions of the patient's history were reviewed and updated as appropriate: allergies, current medications, past family history, past medical history, past social history, past surgical history and problem list.  Review of Systems Pertinent items are noted in HPI.   Objective:    BP 105/62   Pulse 62   Ht 5\' 3"  (1.6 m)   Wt 160 lb 11.2 oz (72.9 kg)   LMP  (LMP Unknown)   BMI 28.47 kg/m   General:  alert and cooperative   Breasts:  not evaluated  Lungs: clear to auscultation bilaterally  Heart:  regular rate and rhythm, S1, S2 normal, no murmur, click, rub or gallop  Abdomen: soft, non-tender; bowel sounds normal; no masses,  no organomegaly   Vulva:  not evaluated  Vagina: not evaluated  Cervix:  not evaluated  Corpus: not examined  Adnexa:  not evaluated  Rectal Exam: Not performed.        Assessment:     Normal postpartum exam. Pap smear not done at today's visit. Normal 03/2016. Unwanted fertility   Plan:    1. Contraception: abstinence 2. Message sent to schedule for salpingectomy  3. Follow up in: 1 year for annual exam or sooner as needed.    Luvenia Redden, PA-C 11/20/2016 8:32 AM

## 2016-12-03 ENCOUNTER — Encounter (HOSPITAL_COMMUNITY): Payer: Self-pay

## 2017-01-02 ENCOUNTER — Ambulatory Visit (HOSPITAL_BASED_OUTPATIENT_CLINIC_OR_DEPARTMENT_OTHER)
Admission: RE | Admit: 2017-01-02 | Payer: Medicaid Other | Source: Ambulatory Visit | Admitting: Obstetrics & Gynecology

## 2017-01-02 ENCOUNTER — Encounter (HOSPITAL_BASED_OUTPATIENT_CLINIC_OR_DEPARTMENT_OTHER): Admission: RE | Payer: Self-pay | Source: Ambulatory Visit

## 2017-01-02 SURGERY — SALPINGECTOMY, BILATERAL, LAPAROSCOPIC
Anesthesia: Choice | Laterality: Bilateral

## 2017-10-01 ENCOUNTER — Inpatient Hospital Stay (HOSPITAL_COMMUNITY)
Admission: AD | Admit: 2017-10-01 | Discharge: 2017-10-02 | Disposition: A | Discharge status: Home / Self Care | Payer: Medicaid Other | Attending: Obstetrics and Gynecology | Admitting: Obstetrics and Gynecology

## 2017-10-01 DIAGNOSIS — Z9851 Tubal ligation status: Secondary | ICD-10-CM | POA: Insufficient documentation

## 2017-10-01 DIAGNOSIS — N939 Abnormal uterine and vaginal bleeding, unspecified: Secondary | ICD-10-CM

## 2017-10-01 DIAGNOSIS — Z880 Allergy status to penicillin: Secondary | ICD-10-CM | POA: Insufficient documentation

## 2017-10-01 DIAGNOSIS — Z3202 Encounter for pregnancy test, result negative: Secondary | ICD-10-CM | POA: Insufficient documentation

## 2017-10-01 LAB — CBC WITH DIFFERENTIAL/PLATELET
BASOS ABS: 0 10*3/uL (ref 0.0–0.1)
BASOS PCT: 0 %
Eosinophils Absolute: 0 10*3/uL (ref 0.0–0.7)
Eosinophils Relative: 1 %
HEMATOCRIT: 38.5 % (ref 36.0–46.0)
Hemoglobin: 13.1 g/dL (ref 12.0–15.0)
LYMPHS PCT: 44 %
Lymphs Abs: 3.3 10*3/uL (ref 0.7–4.0)
MCH: 29.5 pg (ref 26.0–34.0)
MCHC: 34 g/dL (ref 30.0–36.0)
MCV: 86.7 fL (ref 78.0–100.0)
Monocytes Absolute: 0.2 10*3/uL (ref 0.1–1.0)
Monocytes Relative: 3 %
NEUTROS ABS: 4 10*3/uL (ref 1.7–7.7)
Neutrophils Relative %: 52 %
PLATELETS: 303 10*3/uL (ref 150–400)
RBC: 4.44 MIL/uL (ref 3.87–5.11)
RDW: 12.8 % (ref 11.5–15.5)
WBC: 7.6 10*3/uL (ref 4.0–10.5)

## 2017-10-01 LAB — URINALYSIS, ROUTINE W REFLEX MICROSCOPIC
Glucose, UA: NEGATIVE mg/dL
Ketones, ur: 15 mg/dL — AB
NITRITE: POSITIVE — AB
PROTEIN: 100 mg/dL — AB
SPECIFIC GRAVITY, URINE: 1.025 (ref 1.005–1.030)
pH: 5.5 (ref 5.0–8.0)

## 2017-10-01 LAB — URINALYSIS, MICROSCOPIC (REFLEX): RBC / HPF: >50 RBC/hpf (ref 0–5)

## 2017-10-01 LAB — POCT PREGNANCY, URINE: PREG TEST UR: NEGATIVE

## 2017-10-01 NOTE — MAU Note (Signed)
Pt states she started having bleeding this morning. States it has slowed since this morning, but states she had to change her pad every 20 minutes. States the largest clot she pass was the size of her palm. States she put the pad that she's wearing on about 45 minutes prior to arrival. Pt denies pain.

## 2017-10-02 ENCOUNTER — Encounter (HOSPITAL_COMMUNITY): Payer: Self-pay

## 2017-10-02 DIAGNOSIS — N939 Abnormal uterine and vaginal bleeding, unspecified: Secondary | ICD-10-CM

## 2017-10-02 MED ORDER — MEGESTROL ACETATE 40 MG PO TABS: 40.0000 mg | ORAL_TABLET | Freq: Two times a day (BID) | ORAL | 0 refills | Status: AC

## 2017-10-02 NOTE — MAU Provider Note (Signed)
History     CSN: [id number]  Arrival date and time: 10/01/17 2034   First Provider Initiated Contact with Patient 10/02/17 0126      Chief Complaint  Patient presents with  . Vaginal Bleeding   Jennifer Dunlap is a 37 y.o. [id number] who presents today with vaginal bleeding. She states that her period started on 10/01/17 and by that evening she had saturated 3 pads in about one hour.   Vaginal Bleeding  The patient's primary symptoms include vaginal bleeding. The patient's pertinent negatives include no pelvic pain or vaginal discharge. This is a new problem. The current episode started yesterday. The problem occurs constantly. The problem has been unchanged. The patient is experiencing no pain. She is not pregnant. Pertinent negatives include no chills, dysuria, fever, frequency, nausea or vomiting. The vaginal discharge was bloody. The vaginal bleeding is heavier than menses. She has been passing clots (about the size of an apple. ). Nothing aggravates the symptoms. Treatments tried: graphene infused menstrual pads that generally take away all her pain, and she has not had any pain.  She uses nothing (hx of failed tubal ligation in 2016) for contraception. Her menstrual history has been regular (LMP 08/30/17 and this bleeding started on 10/01/17 ).   Past Medical History:  Diagnosis Date  . Benign tumor of thyroid gland   . Fibroid   . Pericarditis 2011    Past Surgical History:  Procedure Laterality Date  . LAPAROSCOPIC TUBAL LIGATION Bilateral 10/12/2014   Procedure: LAPAROSCOPIC TUBAL LIGATION;  Surgeon: Woodroe Mode, MD;  Location: [facility];  Service: Gynecology;  Laterality: Bilateral;  . THYROIDECTOMY Right 11/14/2016   Procedure: RIGHT THYROIDECTOMY;  Surgeon: Jerrell Belfast, MD;  Location: [facility];  Service: ENT;  Laterality: Right;    Family History  Problem Relation Age of Onset  . Heart disease Father   . Heart attack Father   . Stroke Father   . Cancer Sister 54   ovarian  . Hypertension Mother   . Heart attack Paternal Grandmother     Social History   Tobacco Use  . Smoking status: Never Smoker  . Smokeless tobacco: Never Used  Substance Use Topics  . Alcohol use: No    Comment: occcasional  . Drug use: No    Allergies:  Allergies  Allergen Reactions  . Penicillins Anaphylaxis and Other (See Comments)    PATIENT HAS HAD A PCN REACTION WITH IMMEDIATE RASH, FACIAL/TONGUE/THROAT SWELLING, SOB, OR LIGHTHEADEDNESS WITH HYPOTENSION:  #  #  #  YES  #  #  #   Has patient had a PCN reaction causing severe rash involving mucus membranes or skin necrosis: No Has patient had a PCN reaction that required hospitalization No Has patient had a PCN reaction occurring within the last 10 years: No     No medications prior to admission.    Review of Systems  Constitutional: Negative for chills and fever.  Gastrointestinal: Negative for nausea and vomiting.  Genitourinary: Positive for vaginal bleeding. Negative for dysuria, frequency, pelvic pain and vaginal discharge.   Physical Exam   Blood pressure 112/62, pulse 60, temperature 98.1 F (36.7 C), temperature source Oral, resp. rate 18, height 5\' 3"  (1.6 m), weight 64.9 kg, last menstrual period 10/01/2017, SpO2 97 %, currently breastfeeding.  Physical Exam  Nursing note and vitals reviewed. Constitutional: She is oriented to person, place, and time. She appears well-developed and well-nourished. No distress.  HENT:  Head: Normocephalic.  Cardiovascular: Normal rate.  Respiratory:  Effort normal.  GI: Soft. There is no tenderness. There is no rebound.  Neurological: She is alert and oriented to person, place, and time.  Skin: Skin is warm and dry.  Psychiatric: She has a normal mood and affect.   Results for orders placed or performed during the hospital encounter of 10/01/17 (from the past 24 hour(s))  Urinalysis, Routine w reflex microscopic     Status: Abnormal   Collection Time: 10/01/17   9:24 PM  Result Value Ref Range   Color, Urine ORANGE (A) YELLOW   APPearance CLOUDY (A) CLEAR   Specific Gravity, Urine 1.025 1.005 - 1.030   pH 5.5 5.0 - 8.0   Glucose, UA NEGATIVE NEGATIVE mg/dL   Hgb urine dipstick LARGE (A) NEGATIVE   Bilirubin Urine SMALL (A) NEGATIVE   Ketones, ur 15 (A) NEGATIVE mg/dL   Protein, ur 100 (A) NEGATIVE mg/dL   Nitrite POSITIVE (A) NEGATIVE   Leukocytes, UA TRACE (A) NEGATIVE  Urinalysis, Microscopic (reflex)     Status: Abnormal   Collection Time: 10/01/17  9:24 PM  Result Value Ref Range   RBC / HPF >50 0 - 5 RBC/hpf   WBC, UA 6-10 0 - 5 WBC/hpf   Bacteria, UA MANY (A) NONE SEEN   Squamous Epithelial / LPF 0-5 0 - 5  Pregnancy, urine POC     Status: None   Collection Time: 10/01/17  9:27 PM  Result Value Ref Range   Preg Test, Ur NEGATIVE NEGATIVE  CBC with Differential/Platelet     Status: None   Collection Time: 10/01/17  9:49 PM  Result Value Ref Range   WBC 7.6 4.0 - 10.5 K/uL   RBC 4.44 3.87 - 5.11 MIL/uL   Hemoglobin 13.1 12.0 - 15.0 g/dL   HCT 38.5 36.0 - 46.0 %   MCV 86.7 78.0 - 100.0 fL   MCH 29.5 26.0 - 34.0 pg   MCHC 34.0 30.0 - 36.0 g/dL   RDW 12.8 11.5 - 15.5 %   Platelets 303 150 - 400 K/uL   Neutrophils Relative % 52 %   Neutro Abs 4.0 1.7 - 7.7 K/uL   Lymphocytes Relative 44 %   Lymphs Abs 3.3 0.7 - 4.0 K/uL   Monocytes Relative 3 %   Monocytes Absolute 0.2 0.1 - 1.0 K/uL   Eosinophils Relative 1 %   Eosinophils Absolute 0.0 0.0 - 0.7 K/uL   Basophils Relative 0 %   Basophils Absolute 0.0 0.0 - 0.1 K/uL    MAU Course  Procedures  MDM   Assessment and Plan   1. Abnormal uterine bleeding (AUB)    DC home Comfort measures reviewed  Bleeding precautions RX: megace BID x 10 days, outpatient [place] ordered  Return to MAU as needed Patient advised to FU with the clinic. Reviewed after hours appointments are available. Patient appreciative to hear about this option.   Follow-up [facility] for  [place] Follow up.   Specialty:  Obstetrics and Gynecology Contact information: [place] 10/02/2017, 2:38 AM

## 2017-10-02 NOTE — Discharge Instructions (Signed)
In late 2019, the [facility] will be moving to the [place] campus. At that time, the MAU (Maternity Admissions Unit), where you are being seen today, will no longer take care of non-pregnant patients. We strongly encourage you to find a doctor's office before that time, so that you can be seen with any GYN concerns, like vaginal discharge, urinary tract infection, etc.. in a timely manner. ° °In order to make an office visit more convenient, the [facility] will be offering evening hours with same-day appointments, walk-in appointments and scheduled appointments available during this time. ° °[facility] Hours: °Monday - 8am - 7:30 pm with walk-in between 4pm- 7:30 pm °Tuesday - 8 am - 5 pm (starting 05/14/17 we will be open late and accepting walk-ins from 4pm - 7:30pm) °Wednesday - 8 am - 5 pm (starting 08/14/17 we will be open late and accepting walk-ins from 4pm - 7:30pm) °Thursday 8 am - 5 pm (starting 11/14/17 we will be open late and accepting walk-ins from 4pm - 7:30pm) °Friday 8 am - 5 pm ° °For an appointment please call the [facility] at [phone] ° °For urgent needs, [facility] is also available for management of urgent GYN complaints such as vaginal discharge or urinary tract infections. ° ° ° ° ° °

## 2017-10-03 LAB — URINE CULTURE: CULTURE: NO GROWTH

## 2017-10-08 ENCOUNTER — Telehealth: Payer: Self-pay | Admitting: Medical

## 2017-10-09 NOTE — Telephone Encounter (Signed)
Patient is wanting to know her urine culture results and when is her follow-up US for fibroids going to be made. States that it has been a few days and she has not heard anything back yet.

## 2017-10-09 NOTE — Telephone Encounter (Signed)
Notified pt that her urine results are normal and that I will call her tomorrow with [place] appt.  Pt gave permission to leave appt in VM.

## 2017-10-10 NOTE — Telephone Encounter (Signed)
[place] scheduled for 10/16/17 @ 1445.  LM on VM as pt permitted for appt for [place] and if she has any questions to please give the office a call.

## 2017-10-16 ENCOUNTER — Ambulatory Visit (HOSPITAL_COMMUNITY): Admission: RE | Admit: 2017-10-16 | Payer: Self-pay | Source: Ambulatory Visit

## 2018-05-01 IMAGING — US US OB COMP LESS 14 WK
1 series · 15 of 28 positions shown · non-contrast
Comparison: 09/08/2015

CLINICAL DATA: Abdominal pain affecting pregnancy

EXAM:
OBSTETRIC <14 WK US AND TRANSVAGINAL OB US
TECHNIQUE: Both transabdominal and transvaginal ultrasound examinations were
performed for complete evaluation of the gestation as well as the
maternal uterus, adnexal regions, and pelvic cul-de-sac.
Transvaginal technique was performed to assess early pregnancy.

[Series 1: us ob comp less 14 wk · 73 acquisitions, 15 frames shown]
[im 1/73]
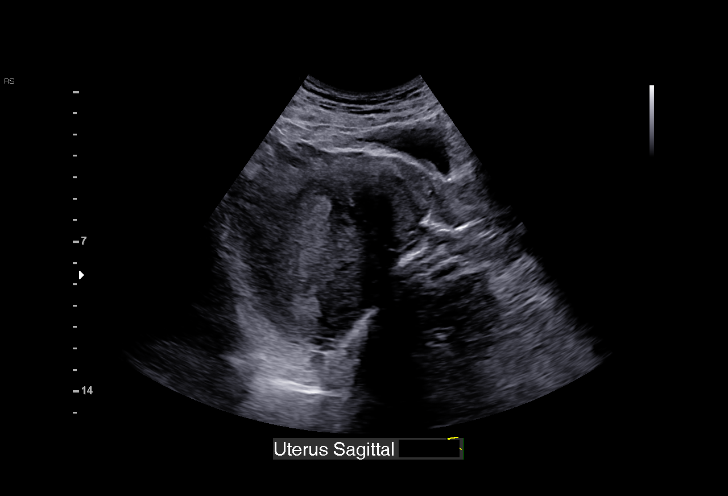
[im 6/73]
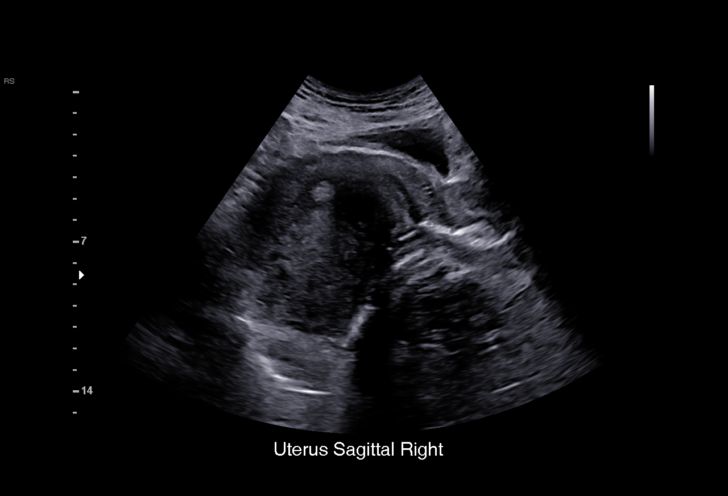
[im 11/73]
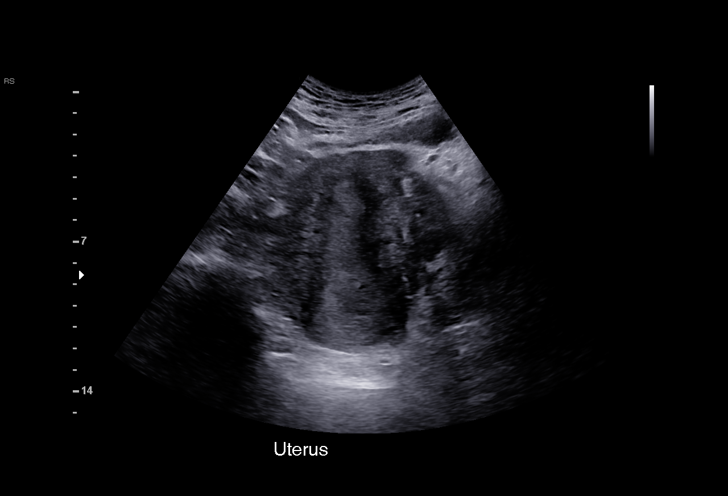
[im 17/73]
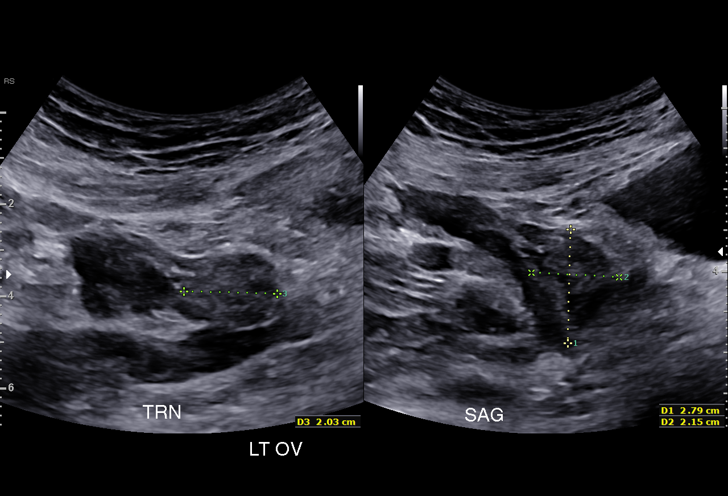
[im 22/73]
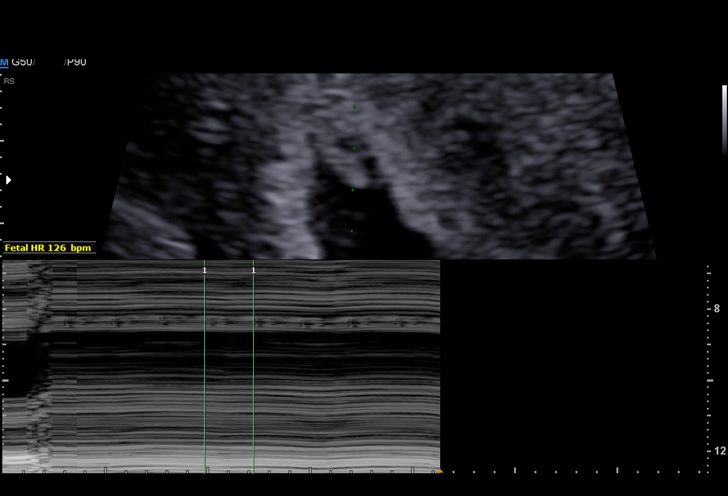
[im 27/73]
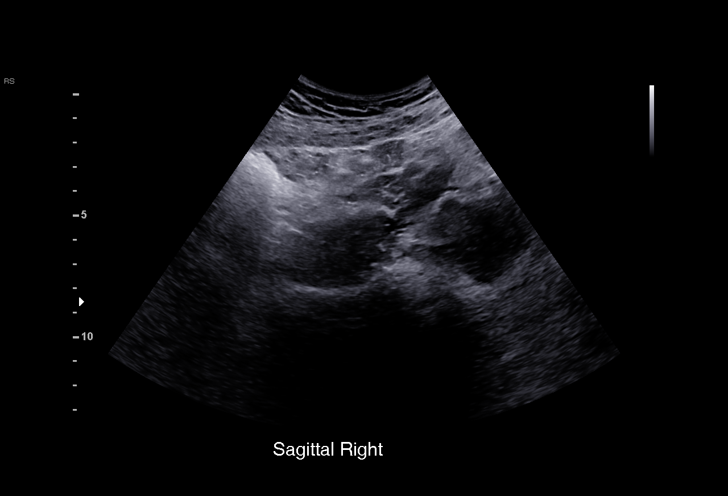
[im 33/73]
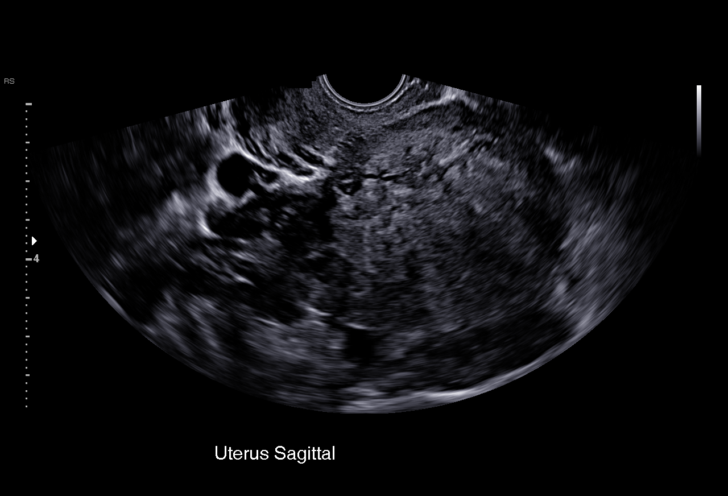
[im 38/73]
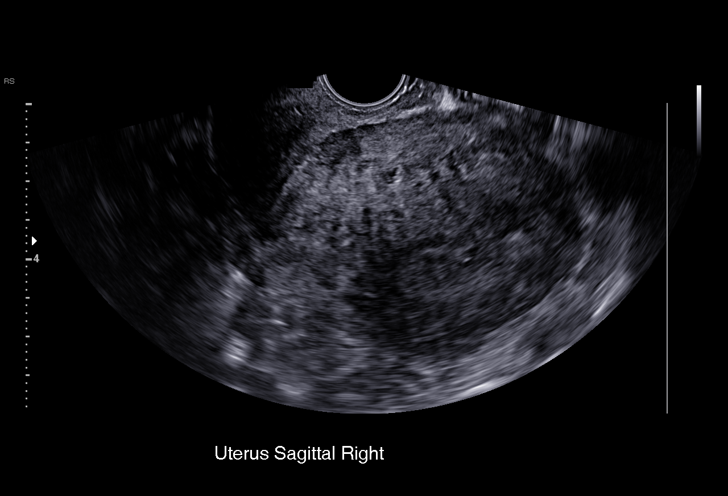
[im 41/73]
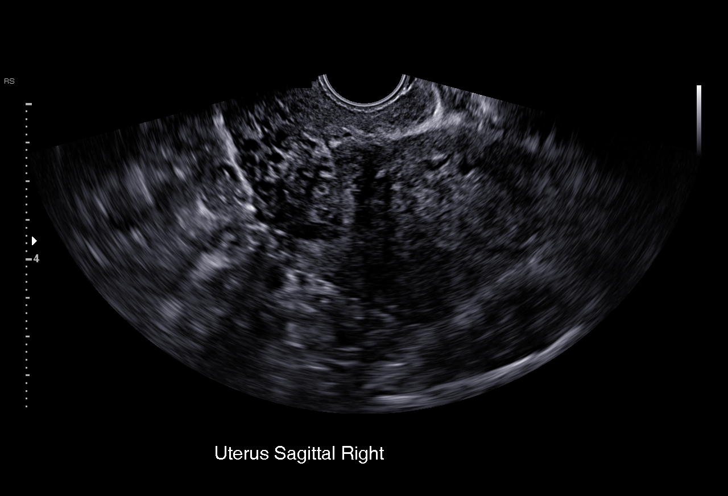
[im 46/73]
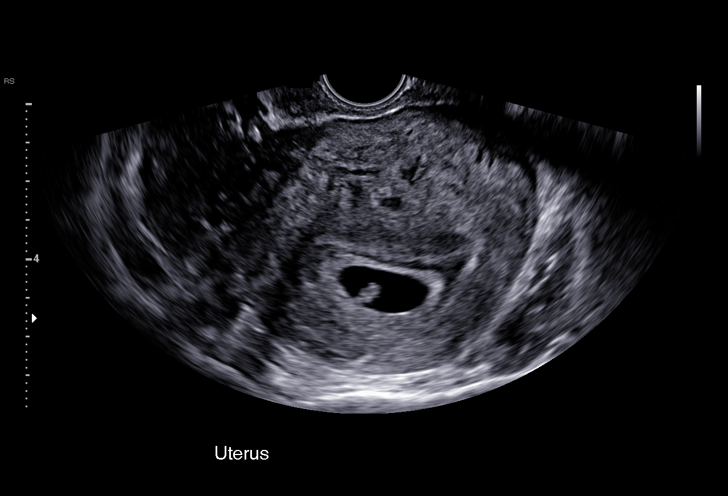
[im 51/73]
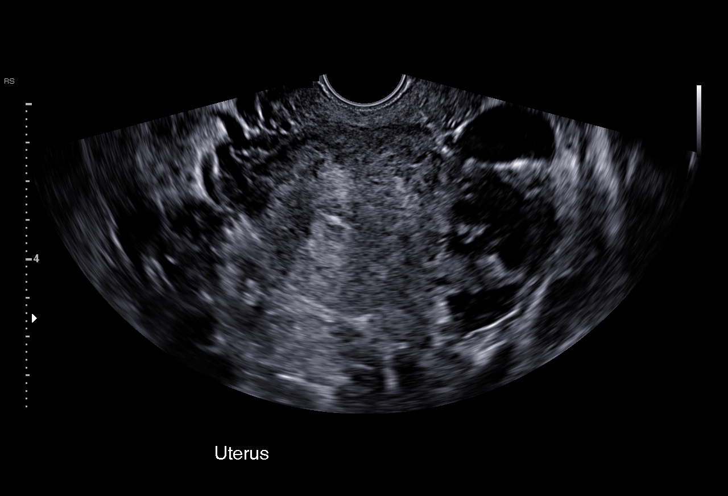
[im 57/73]
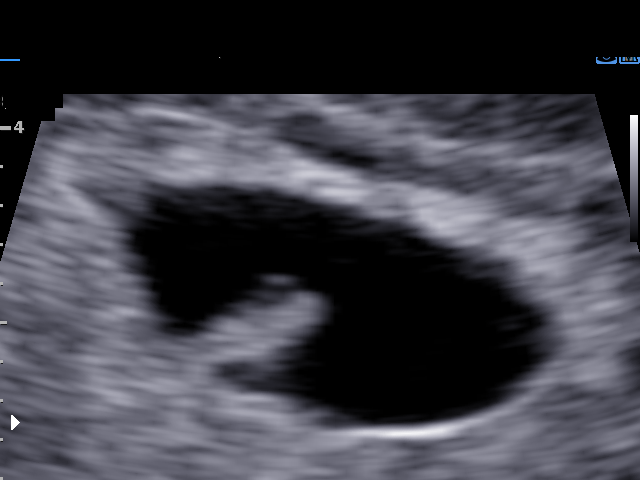
[im 62/73]
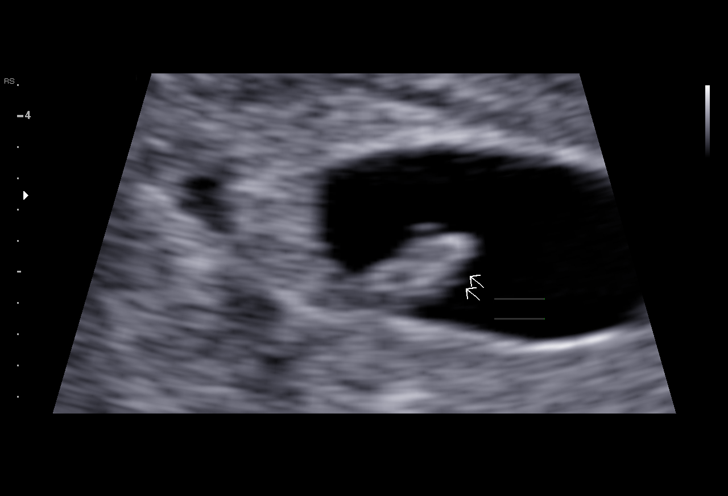
[im 67/73]
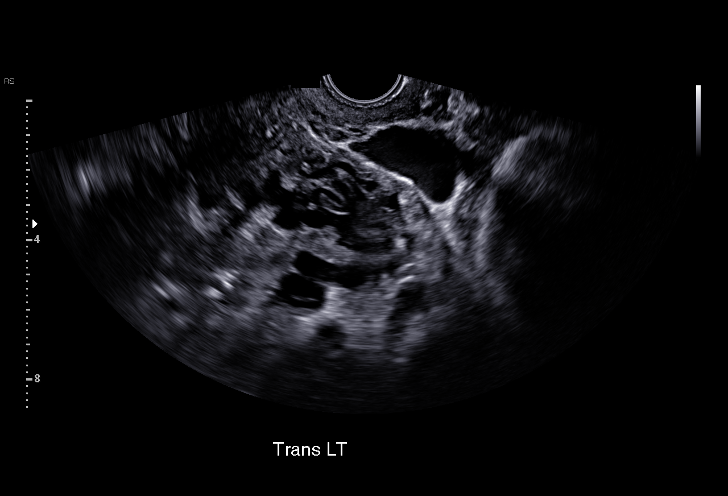
[im 73/73]
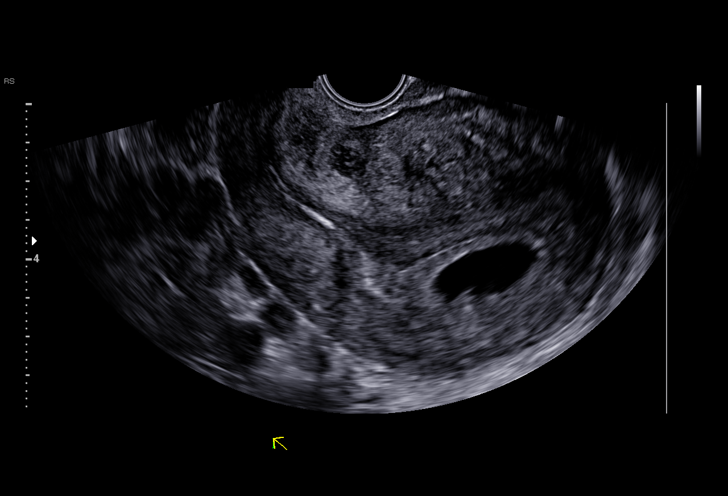

[15 of 28 positions shown; findings below may reference images not displayed]

FINDINGS: Intrauterine gestational sac: Single

Yolk sac:  Visualized

Embryo:  Visualized

Cardiac Activity: Visualized

Heart Rate: 130  bpm

MSD:   mm    w     d

CRL:  7.9  mm   6 w   4 d                  US EDC: 10/07/2016

Subchorionic hemorrhage: Small subchorionic hemorrhage measuring
x 1.6 x 1.2 cm

Maternal uterus/adnexae: No adnexal masses or free fluid. Small
posterior lower uterine segment fibroid, 1 cm maximally.
IMPRESSION: 6 week 4 day intrauterine pregnancy. Fetal heart rate 130 beats per
minute. Small subchorionic hemorrhage.

## 2018-05-16 IMAGING — US US OB TRANSVAGINAL
1 series · 15 of 28 positions shown · non-contrast
Comparison: None.

CLINICAL DATA: Vaginal bleeding

EXAM:
OBSTETRIC <14 WK ULTRASOUND
TECHNIQUE: Transabdominal ultrasound was performed for evaluation of the
gestation as well as the maternal uterus and adnexal regions.

[Series 1: us ob transvaginal · 50 acquisitions, 15 frames shown]
[im 1/50]
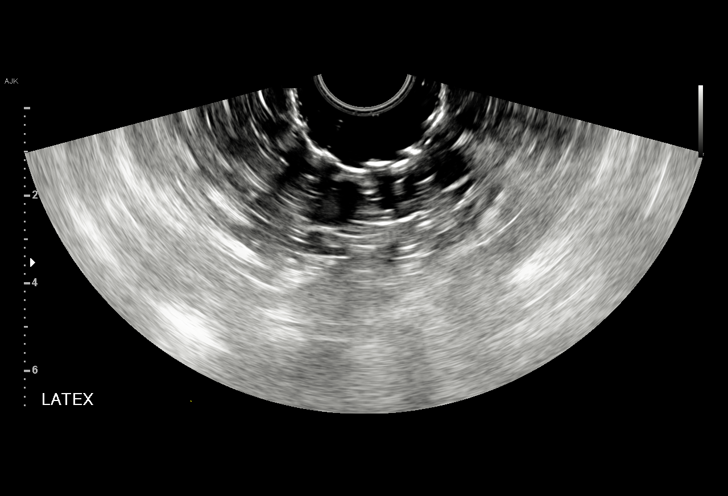
[im 4/50]
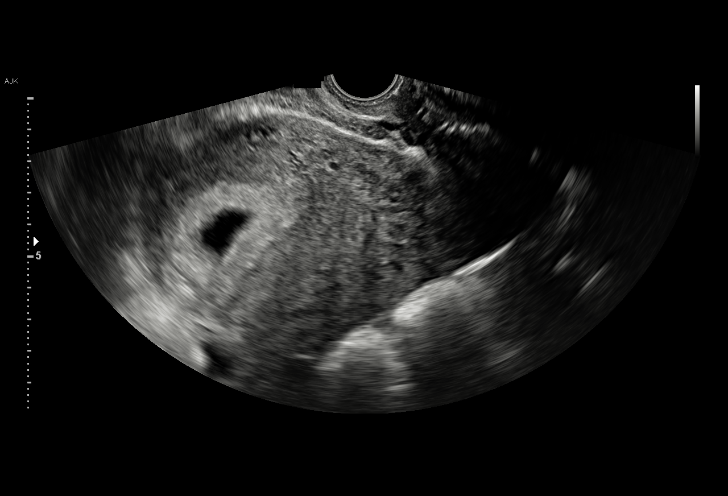
[im 8/50]
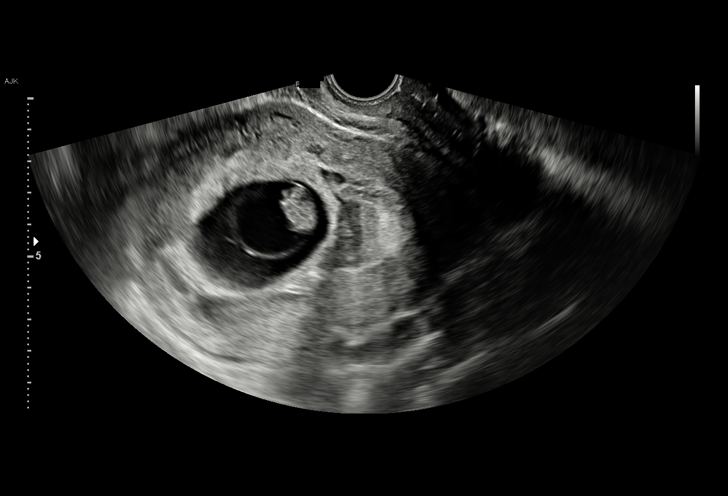
[im 11/50]
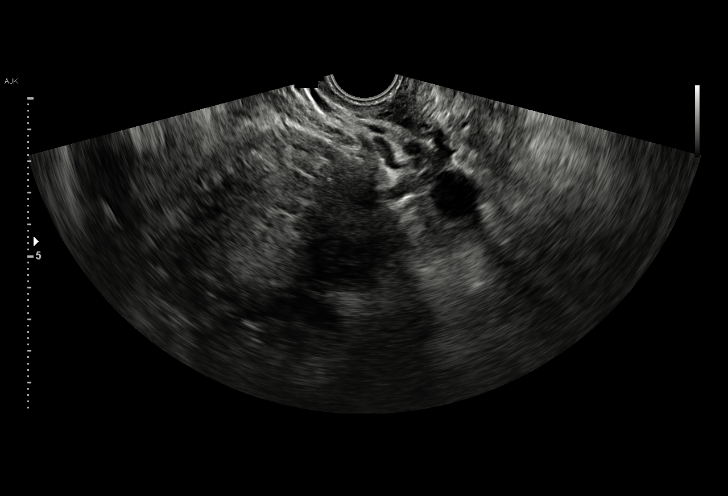
[im 15/50]
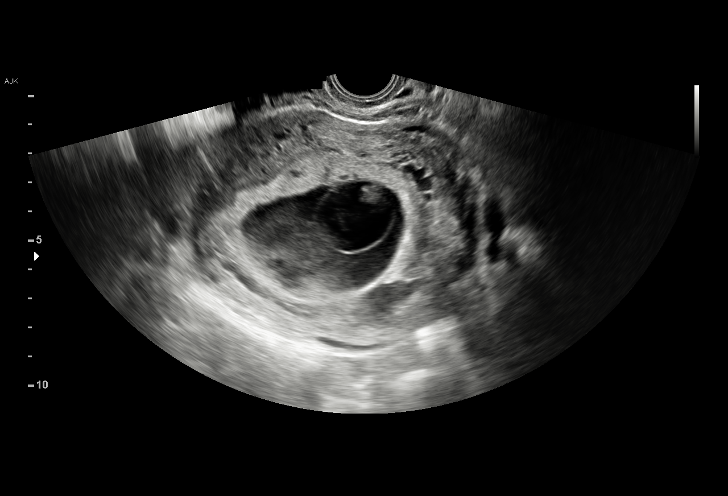
[im 19/50]
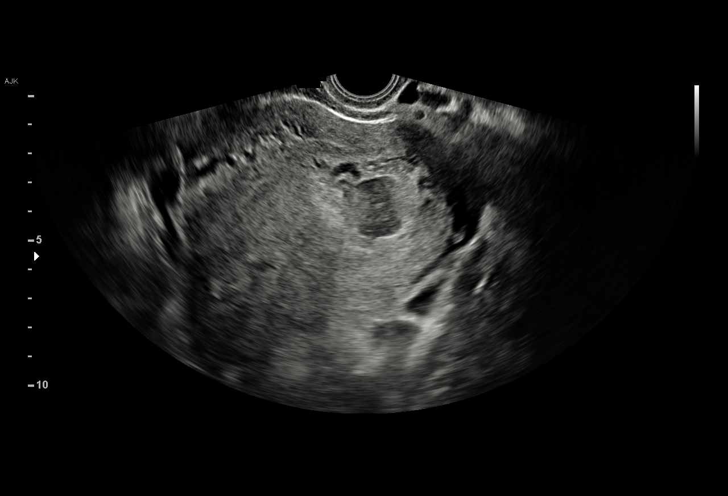
[im 22/50]
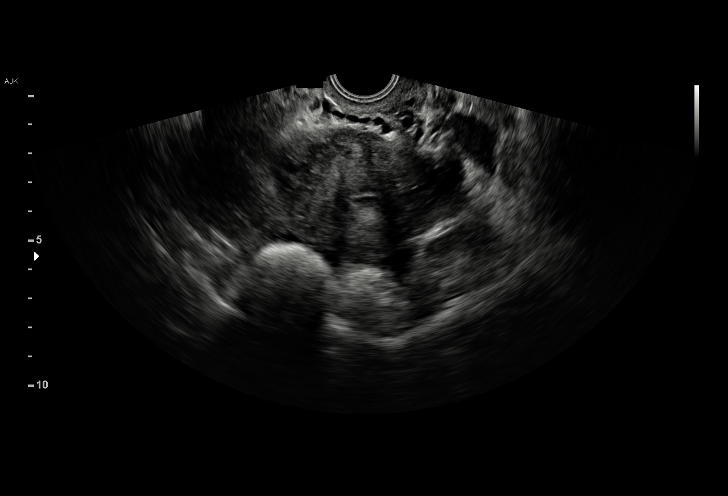
[im 26/50]
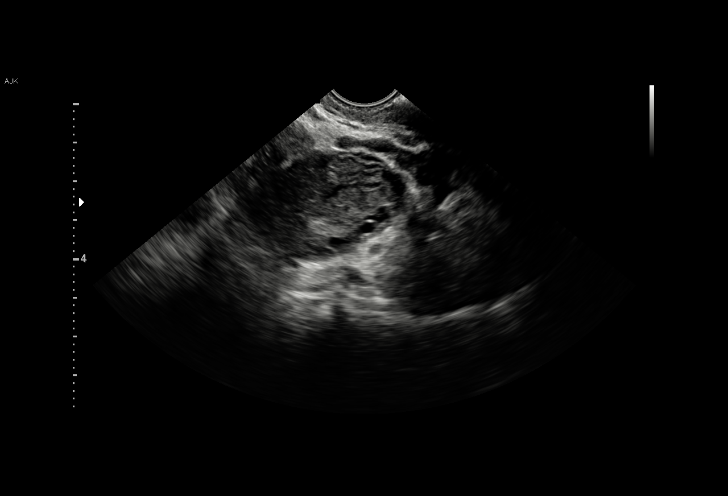
[im 28/50]
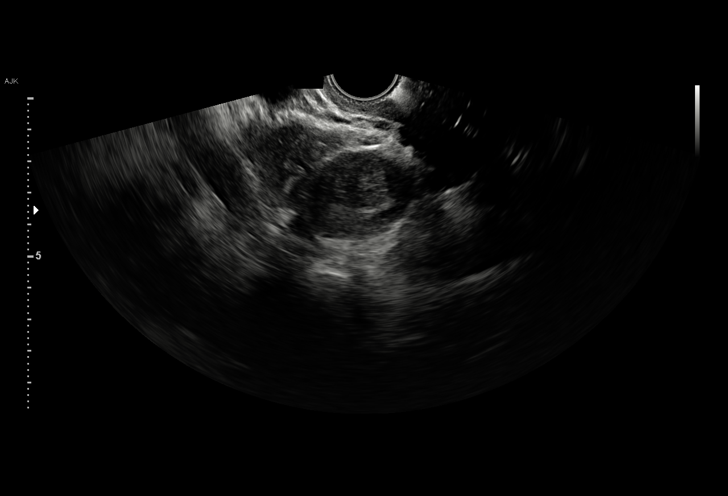
[im 31/50]
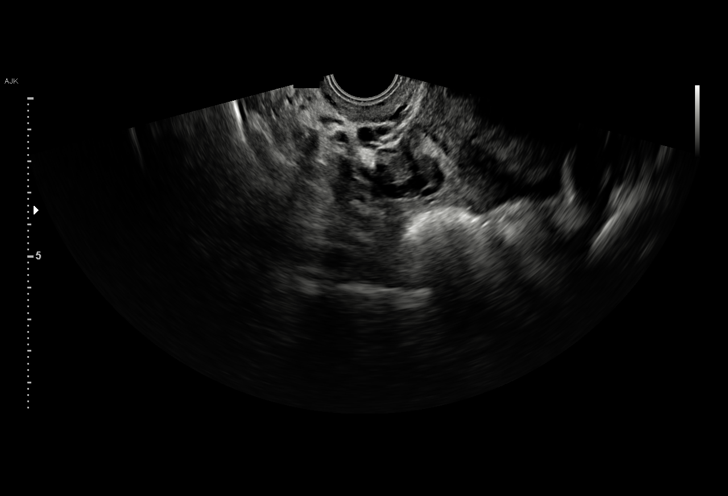
[im 35/50]
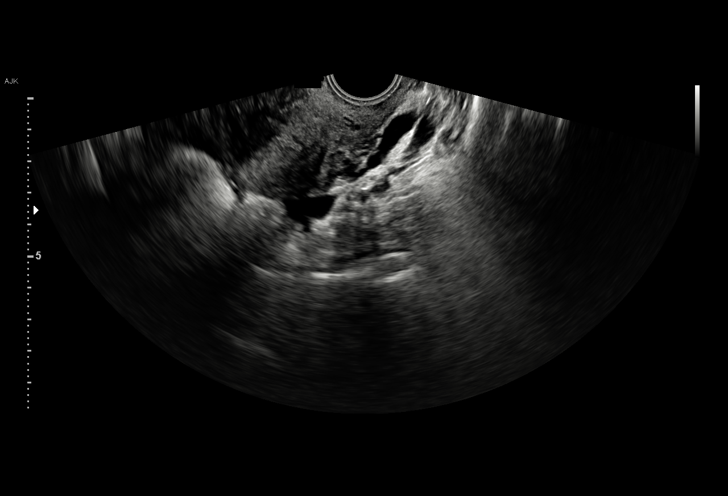
[im 39/50]
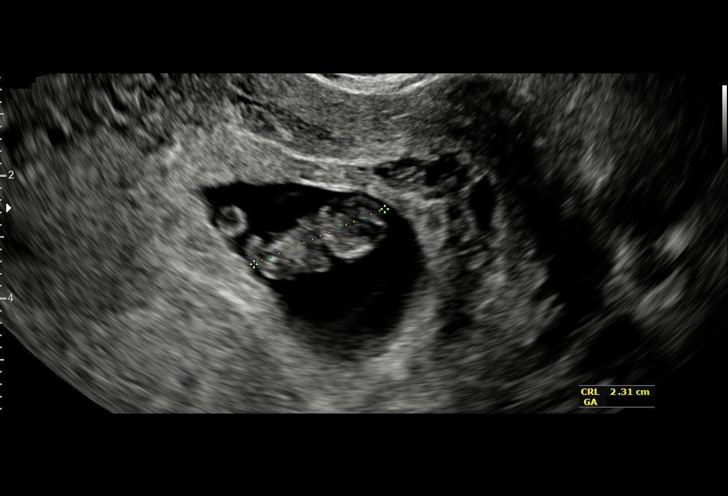
[im 42/50]
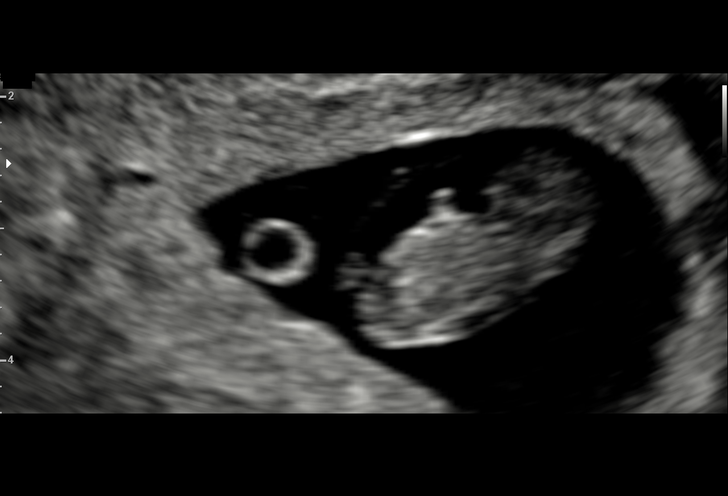
[im 46/50]
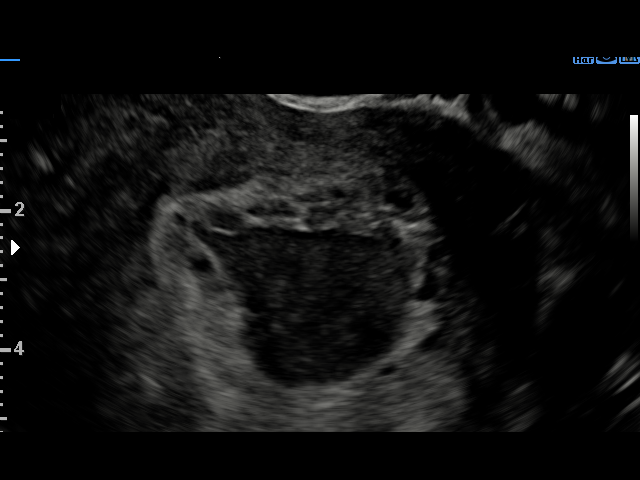
[im 50/50]
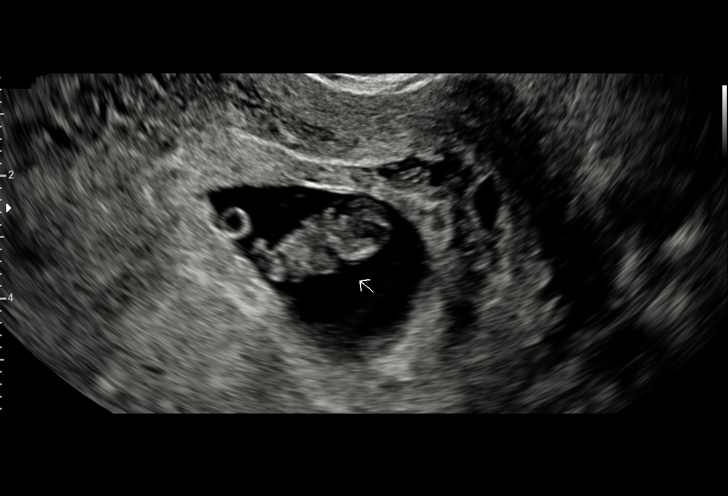

[15 of 28 positions shown; findings below may reference images not displayed]

FINDINGS: Intrauterine gestational sac: Single

Yolk sac:  Visualized.

Embryo:  Visualized.

Cardiac Activity: Visualized.

Heart Rate: 164 bpm

CRL:   23  mm   8 w 6d                  US EDC: 10/06/2016

Subchorionic hemorrhage:  Small-moderate subchorionic hemorrhage.

Maternal uterus/adnexae: Right ovarian corpus luteum cyst of
pregnancy. No adnexal masses. No significant pelvic free fluid.
IMPRESSION: 1. Single live intrauterine pregnancy as detailed above.
2. Small-moderate subchorionic hemorrhage. Attention on follow-up
examination is recommended.

## 2018-08-21 IMAGING — US US MFM OB FOLLOW-UP
1 series · 12 of 28 positions shown · non-contrast
Comparison: none

[Series 1: us mfm ob follow-up · 12 of 45 slices shown]
[im 2/45]
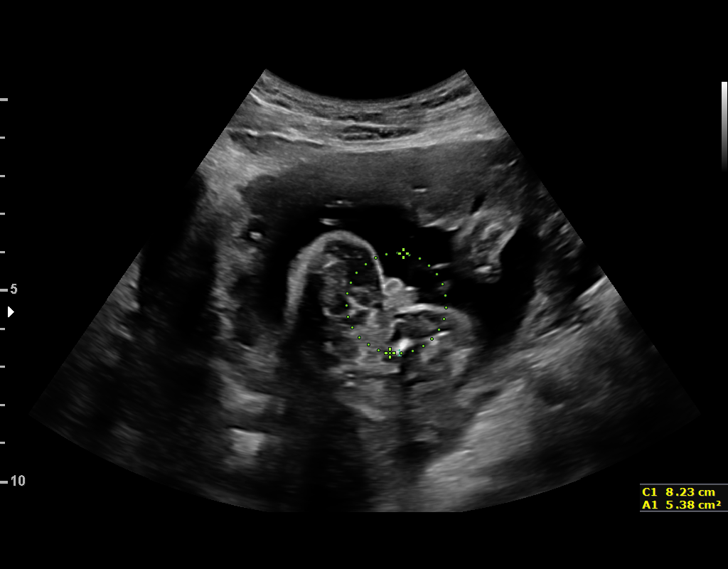
[im 5/45]
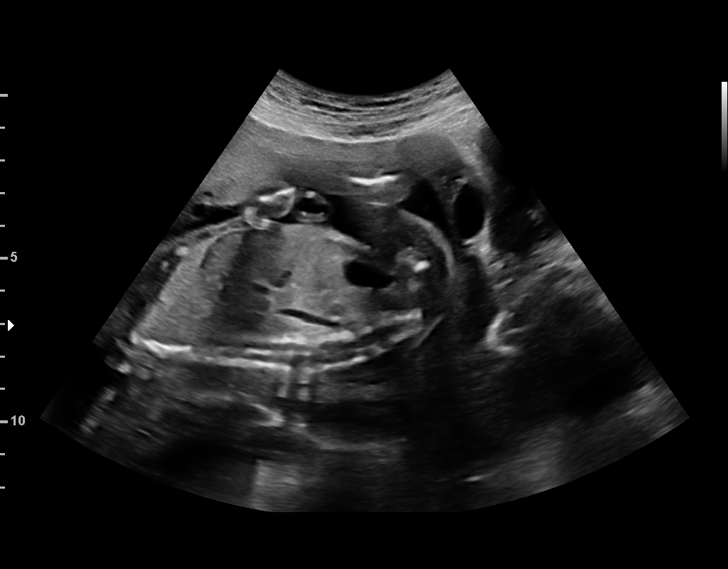
[im 9/45]
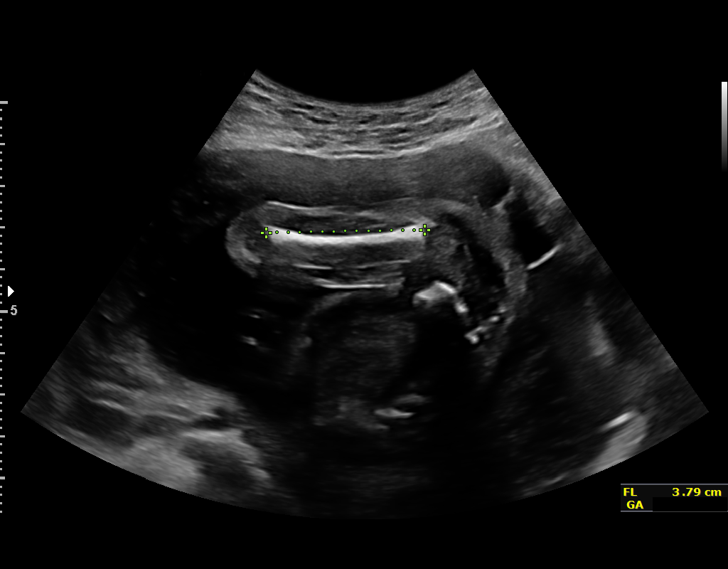
[im 14/45]
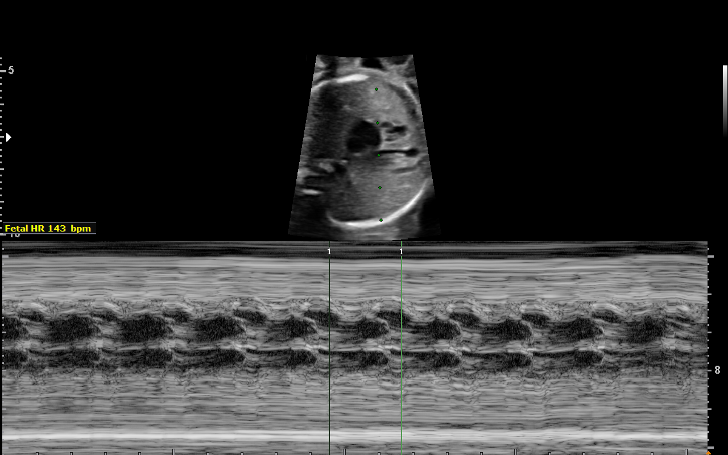
[im 17/45]
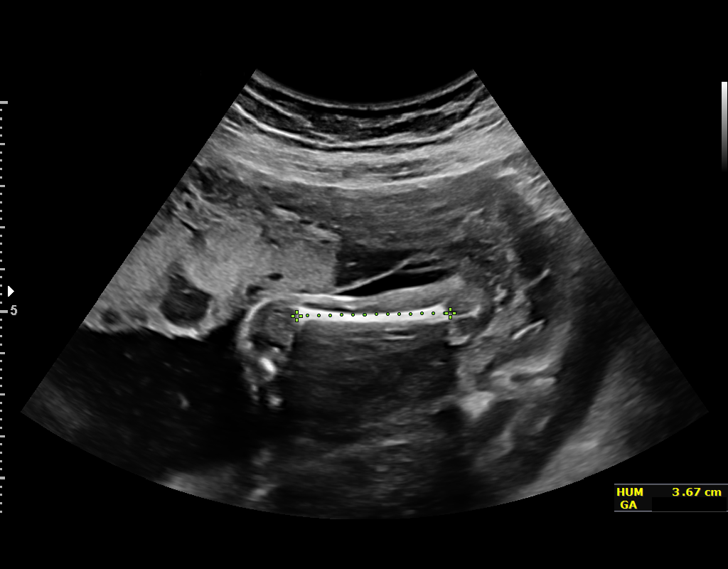
[im 20/45]
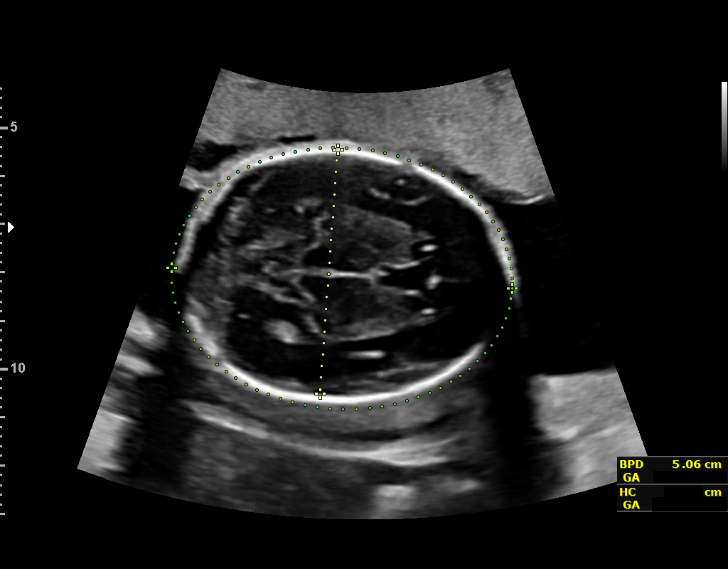
[im 25/45]
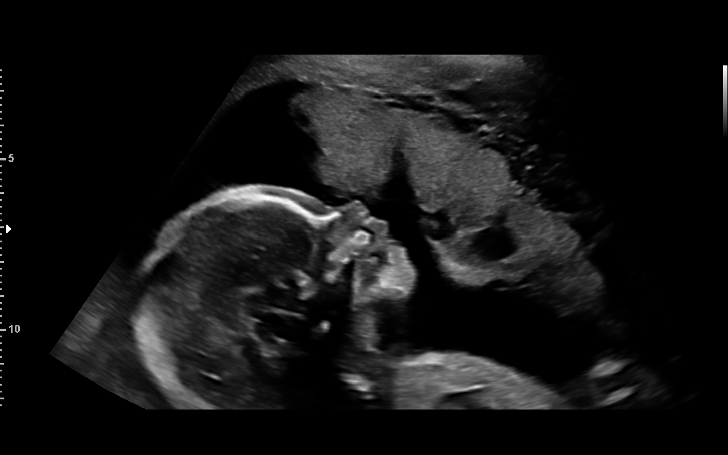
[im 28/45]
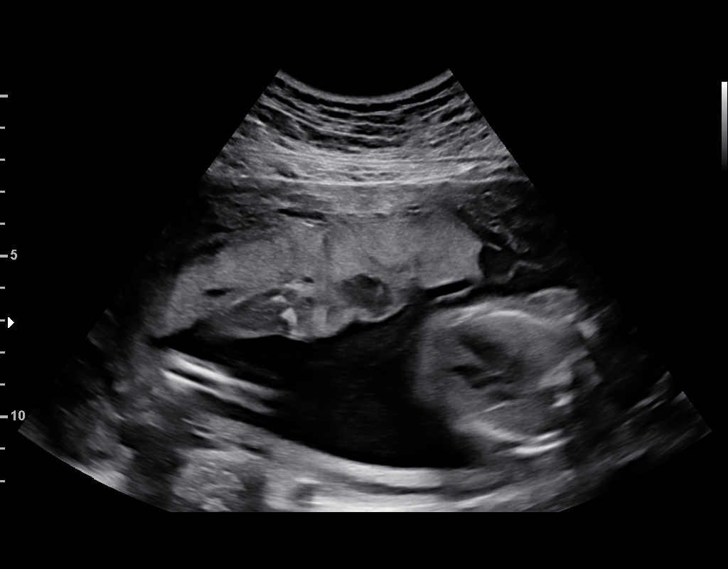
[im 31/45]
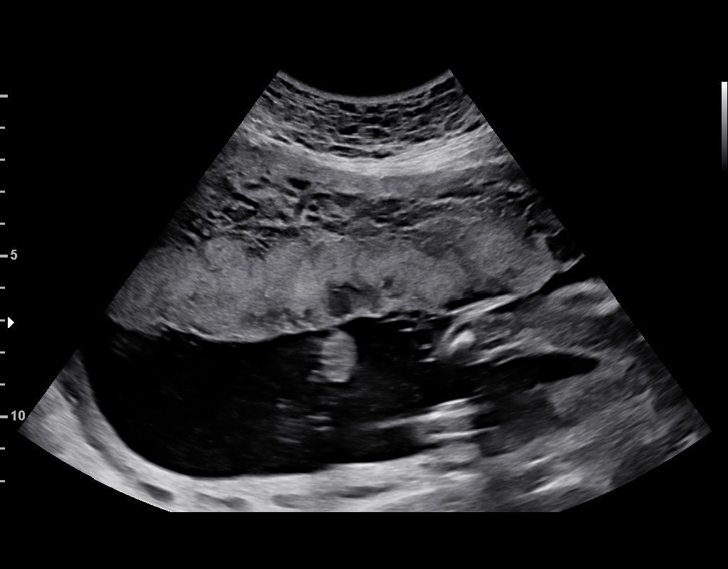
[im 36/45]
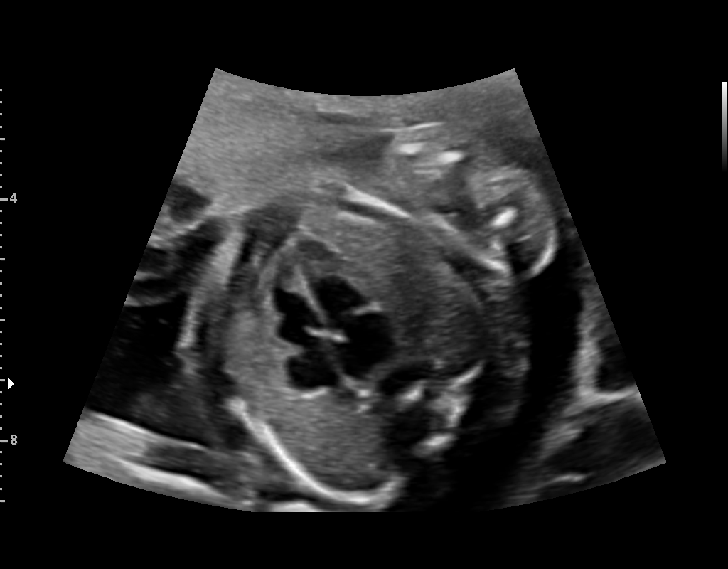
[im 40/45]
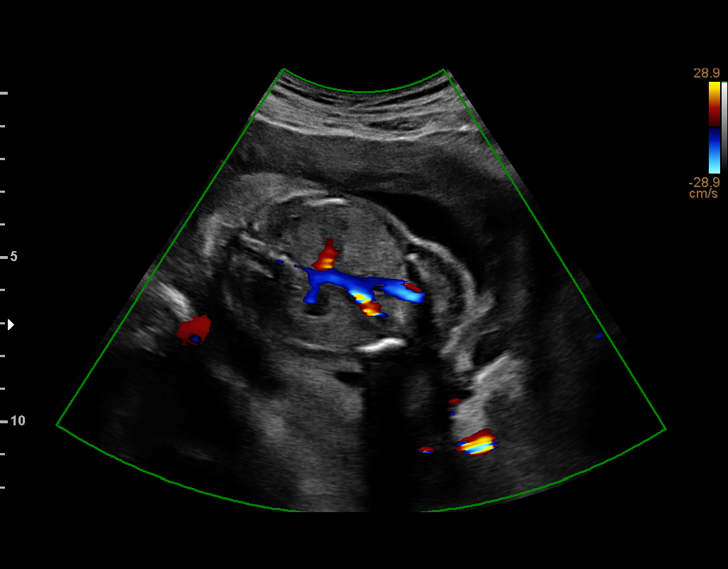
[im 43/45]
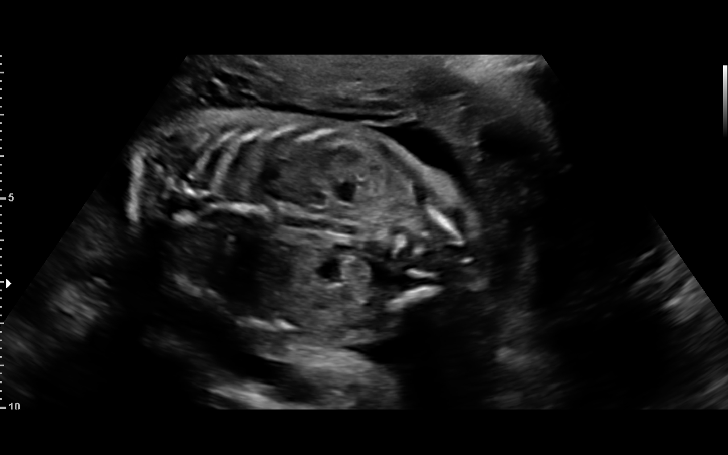

[12 of 28 positions shown; findings below may reference images not displayed]

OB/Gyn Clinic
[REDACTED]

1  BESHARET SANTUR           [id number]      [id number]     [id number]
Indications

23 weeks gestation of pregnancy
Vaginal bleeding in pregnancy, second
trimester
Abdominal pain in pregnancy (RLQ pain)
Medical complication of pregnancy (history
of pericarditis)
Thyroid disease in pregnancy                   O99.280,
Uterine fibroids affecting pregnancy in        O34.12,
second trimester, antepartum
OB History

Blood Type:            Height:  5'4"   Weight (lb):  169      BMI:
Gravidity:    10        Term:   7        Prem:   0        SAB:   0
TOP:          2       Ectopic:  0        Living: 7
Fetal Evaluation

Num Of Fetuses:     1
Fetal Heart         143
Rate(bpm):
Cardiac Activity:   Observed
Presentation:       Breech
Placenta:           Anterior, above cervical os
P. Cord Insertion:  Previously Visualized

Amniotic Fluid
AFI FV:      Subjectively increased

Largest Pocket(cm)
8.03
Biometry

BPD:      50.9  mm     G. Age:  21w 3d          3  %    CI:        68.78   %   70 - 86
FL/HC:      19.5   %   19.2 -
HC:      196.1  mm     G. Age:  21w 6d          3  %    HC/AC:      1.08       1.05 -
AC:      181.5  mm     G. Age:  23w 0d         37  %    FL/BPD:     75.0   %   71 - 87
FL:       38.2  mm     G. Age:  22w 2d         14  %    FL/AC:      21.0   %   20 - 24
HUM:      36.6  mm     G. Age:  22w 5d         34  %

Est. FW:     511  gm      1 lb 2 oz     39  %
Gestational Age

LMP:           23w 1d       Date:   12/28/15                 EDD:   10/03/16
U/S Today:     22w 1d                                        EDD:   10/10/16
Best:          23w 1d    Det. By:   LMP  (12/28/15)          EDD:   10/03/16
Anatomy

Cranium:               Appears normal         Aortic Arch:            Previously seen
Cavum:                 Appears normal         Ductal Arch:            Previously seen
Ventricles:            Appears normal         Diaphragm:              Previously seen
Choroid Plexus:        Previously seen        Stomach:                Appears normal, left
sided
Cerebellum:            Previously seen        Abdomen:                Appears normal
Posterior Fossa:       Previously seen        Abdominal Wall:         Previously seen
Nuchal Fold:           Previously seen        Cord Vessels:           Previously seen
Face:                  Profile previously     Kidneys:                Appear normal
seen
Lips:                  Previously seen        Bladder:                Appears normal
Thoracic:              Appears normal         Spine:                  Previously seen
Heart:                 Appears normal         Upper Extremities:      Previously seen
(4CH, axis, and situs
RVOT:                  Previously seen        Lower Extremities:      Previously seen
LVOT:                  Appears normal

Other:  Fetus appears to be a female. Heels and 5th digit previously
visualized. Nasal bone previously visualized.
Cervix Uterus Adnexa

Cervix
Length:           3.96  cm.
Normal appearance by transabdominal scan.

Uterus
No abnormality visualized.

Left Ovary
Previously seen.
Right Ovary
Previously seen
Impression

Single IUP at 23w 1d
History of thyroid disorder, pericarditis (resolved)
Normal interval anatomy
Fetal growth is appropriate (39th %tile)
Anterior placenta without previa
Subjectivley increased amniotic fluid volume
Recommendations

Recommend follow-up ultrasound examination in 4 weeks for
interval growth; will reevaluate amniotic fluid volume at that
time

## 2018-11-14 IMAGING — US US MFM OB FOLLOW-UP
1 series · 14 of 23 positions shown · non-contrast
Comparison: none

[Series 1: us mfm ob follow-up · 14 of 23 slices shown]
[im 1/23]
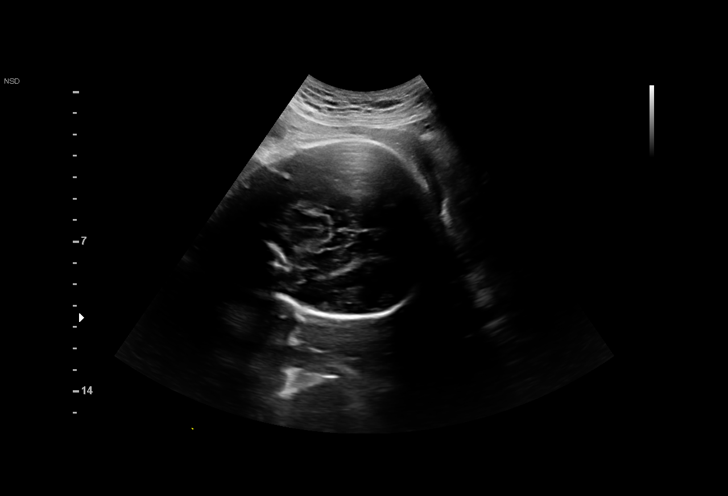
[im 3/23]
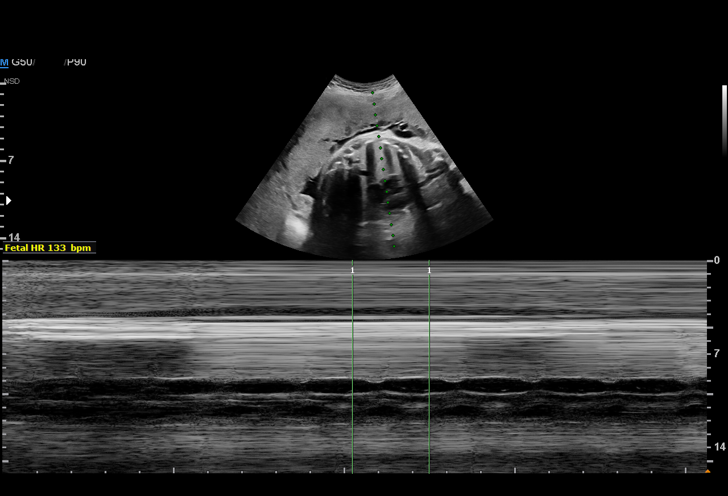
[im 5/23]
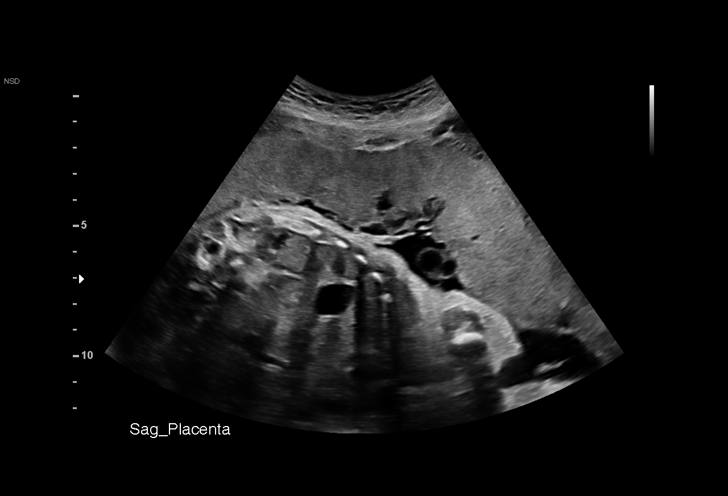
[im 6/23]
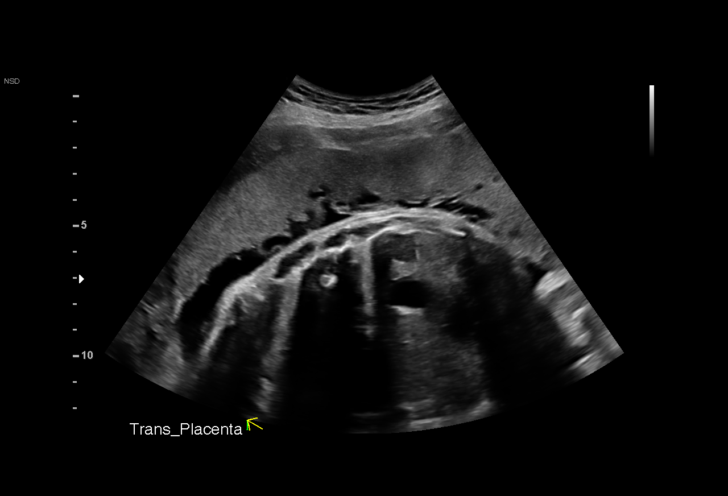
[im 8/23]
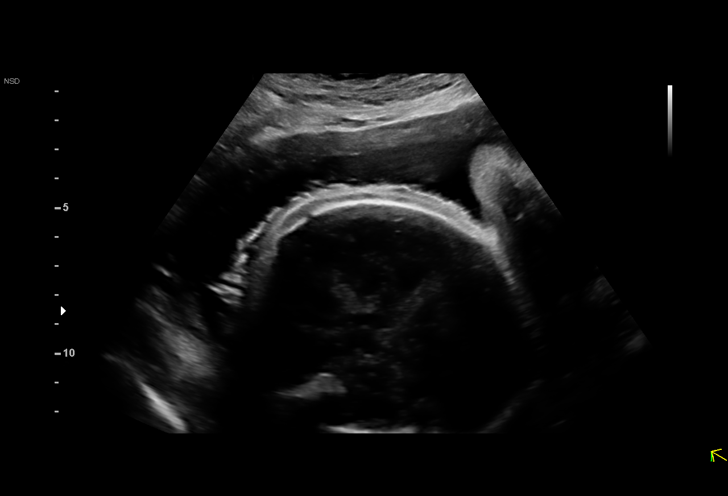
[im 10/23]
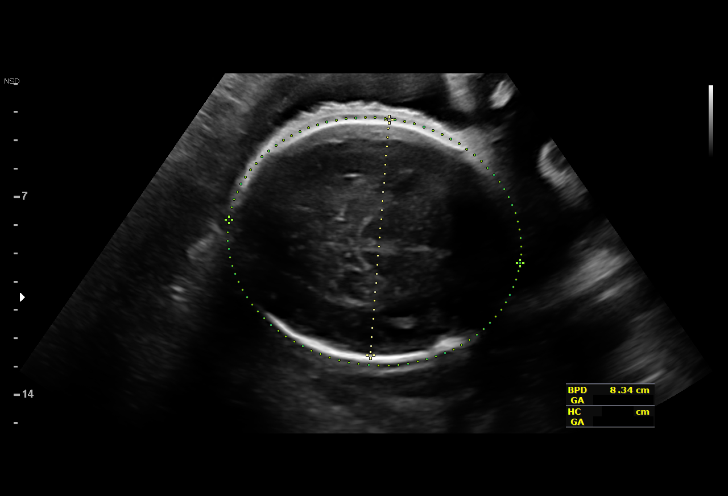
[im 11/23]
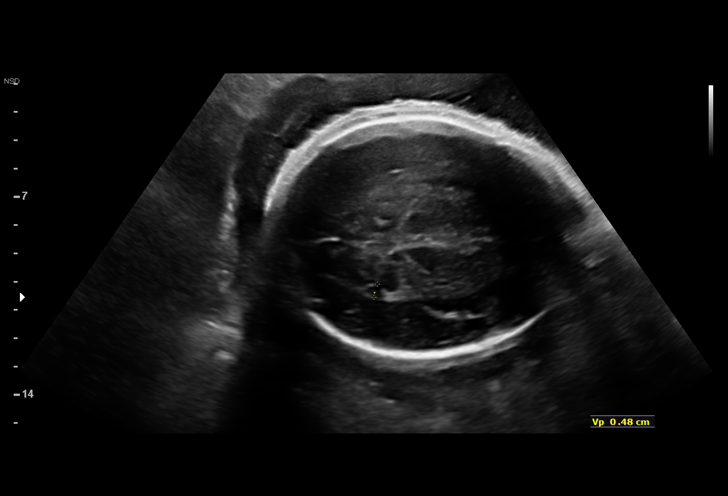
[im 13/23]
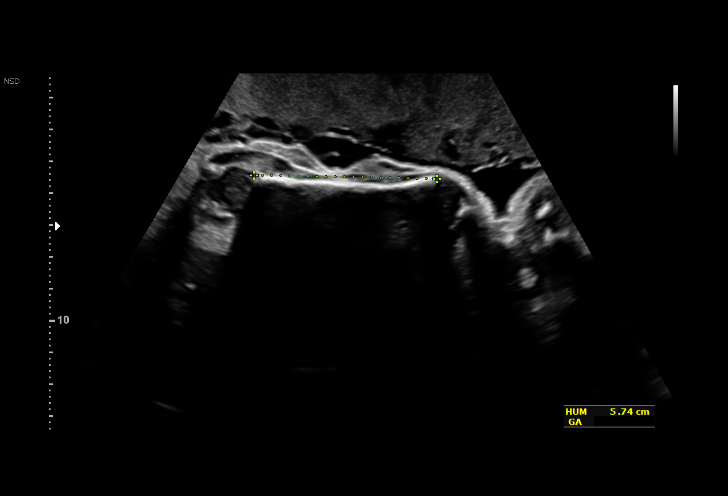
[im 14/23]
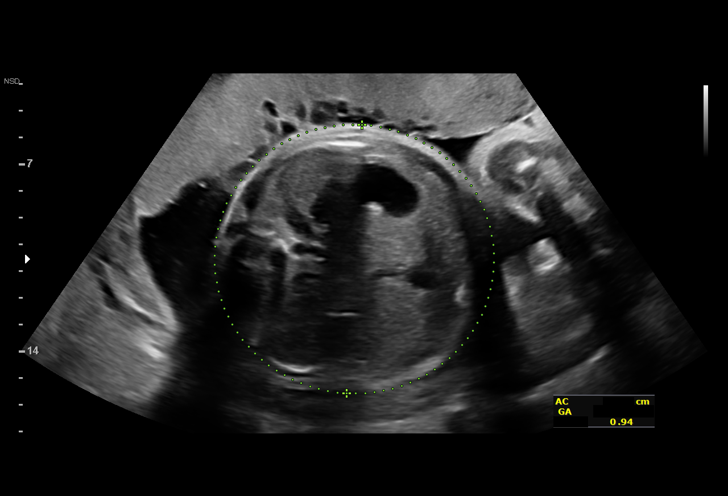
[im 16/23]
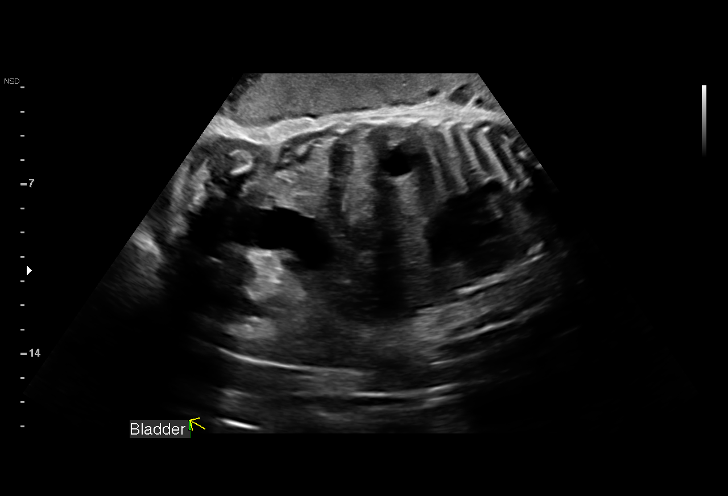
[im 18/23]
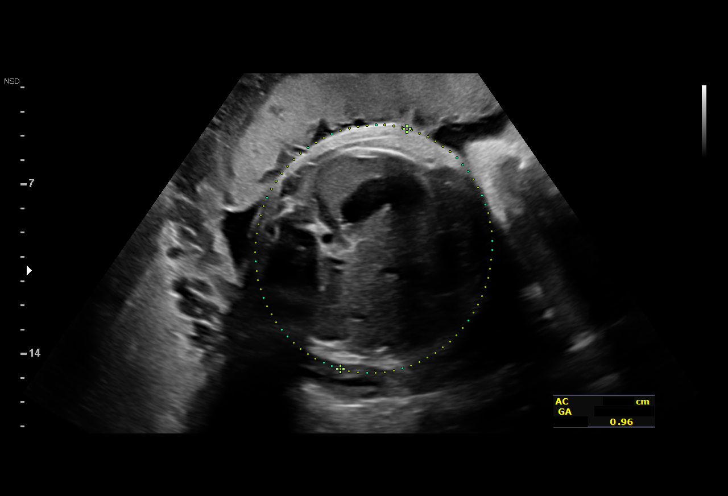
[im 19/23]
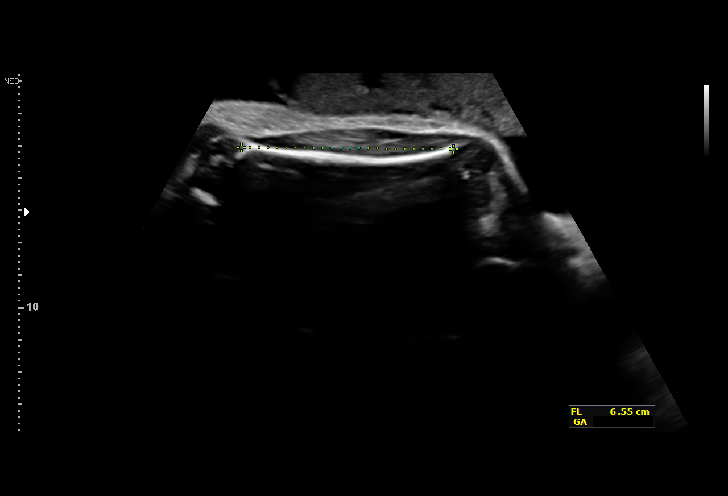
[im 21/23]
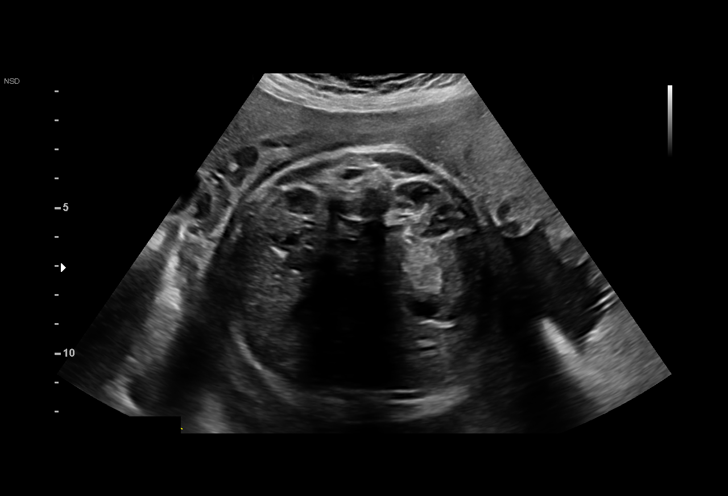
[im 23/23]
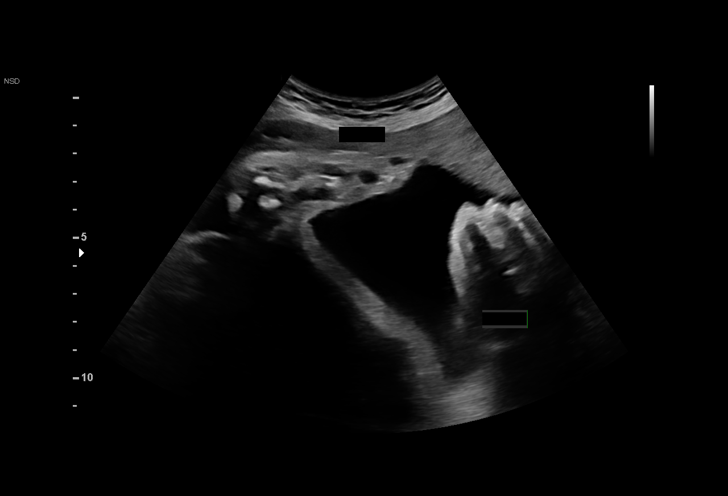

[14 of 23 positions shown; findings below may reference images not displayed]

OB/Gyn Clinic
[REDACTED]

1  AYYATEY PONSEKA           [id number]      [id number]     [id number]
Indications

35 weeks gestation of pregnancy
Medical complication of pregnancy (history
of pericarditis)
Thyroid disease in pregnancy (Thyromegaly)     O99.280,
Uterine fibroids affecting pregnancy in third  O34.13,
trimester, antepartum
OB History

Blood Type:            Height:  5'4"   Weight (lb):  169      BMI:
Gravidity:    10        Term:   7        Prem:   0        SAB:   0
TOP:          2       Ectopic:  0        Living: 7
Fetal Evaluation

Num Of Fetuses:     1
Fetal Heart         133
Rate(bpm):
Cardiac Activity:   Observed
Presentation:       Cephalic
Placenta:           Anterior, above cervical os
P. Cord Insertion:  Previously seen as normal
Amniotic Fluid
AFI FV:      Subjectively within normal limits

AFI Sum(cm)     %Tile       Largest Pocket(cm)
22.28           84

RUQ(cm)       RLQ(cm)       LUQ(cm)        LLQ(cm)
4.34
Biometry

BPD:      83.5  mm     G. Age:  33w 4d         12  %    CI:        74.36   %   70 - 86
FL/HC:      21.2   %   20.1 -
HC:      307.4  mm     G. Age:  34w 2d          6  %    HC/AC:      0.97       0.93 -
AC:      317.9  mm     G. Age:  35w 5d         70  %    FL/BPD:     78.2   %   71 - 87
FL:       65.3  mm     G. Age:  33w 5d         10  %    FL/AC:      20.5   %   20 - 24
HUM:      57.9  mm     G. Age:  33w 4d         32  %

Est. FW:    3169  gm      5 lb 9 oz     52  %
Gestational Age

LMP:           35w 2d       Date:   12/28/15                 EDD:   10/03/16
U/S Today:     34w 2d                                        EDD:   10/10/16
Best:          35w 2d    Det. By:   LMP  (12/28/15)          EDD:   10/03/16
Anatomy

Cranium:               Appears normal         Aortic Arch:            Previously seen
Cavum:                 Previously seen        Ductal Arch:            Previously seen
Ventricles:            Appears normal         Diaphragm:              Previously seen
Choroid Plexus:        Previously seen        Stomach:                Previously Seen
Cerebellum:            Previously seen        Abdomen:                Appears normal
Posterior Fossa:       Previously seen        Abdominal Wall:         Previously seen
Nuchal Fold:           Previously seen        Cord Vessels:           Previously seen
Face:                  Profile previously     Kidneys:                Appear normal
seen
Lips:                  Previously seen        Bladder:                Appears normal
Thoracic:              Previously seen        Spine:                  Previously seen
Heart:                 Previously seen        Upper Extremities:      Previously seen
RVOT:                  Previously seen        Lower Extremities:      Previously seen
LVOT:                  Previously seen

Other:  Female gender previously seen..Heels and 5th digit previously
visualized. Nasal bone previously visualized. Technically difficult due
to fetal position.
Cervix Uterus Adnexa

Cervix
Not visualized (advanced GA >88wks)

Left Ovary
Previously seen.

Right Ovary
Previously seen
Impression

SIUP at 35+2 weeks
Cephalic presentation
Normal interval anatomy; anatomic survey complete
Normal amniotic fluid volume
Appropriate interval growth with EFW at the 52nd %tile
Recommendations

Follow-up as clinically indicated

## 2020-10-14 ENCOUNTER — Emergency Department (HOSPITAL_BASED_OUTPATIENT_CLINIC_OR_DEPARTMENT_OTHER)
Admission: EM | Admit: 2020-10-14 | Discharge: 2020-10-15 | Disposition: A | Discharge status: Home / Self Care | Payer: Self-pay | Attending: Emergency Medicine | Admitting: Emergency Medicine

## 2020-10-14 ENCOUNTER — Encounter (HOSPITAL_BASED_OUTPATIENT_CLINIC_OR_DEPARTMENT_OTHER): Payer: Self-pay

## 2020-10-14 ENCOUNTER — Other Ambulatory Visit: Payer: Self-pay

## 2020-10-14 ENCOUNTER — Emergency Department (HOSPITAL_BASED_OUTPATIENT_CLINIC_OR_DEPARTMENT_OTHER): Payer: Self-pay

## 2020-10-14 DIAGNOSIS — Z28310 Unvaccinated for covid-19: Secondary | ICD-10-CM | POA: Insufficient documentation

## 2020-10-14 DIAGNOSIS — Z20822 Contact with and (suspected) exposure to covid-19: Secondary | ICD-10-CM | POA: Insufficient documentation

## 2020-10-14 DIAGNOSIS — N12 Tubulo-interstitial nephritis, not specified as acute or chronic: Secondary | ICD-10-CM | POA: Insufficient documentation

## 2020-10-14 DIAGNOSIS — R059 Cough, unspecified: Secondary | ICD-10-CM | POA: Insufficient documentation

## 2020-10-14 LAB — CBC WITH DIFFERENTIAL/PLATELET
Abs Immature Granulocytes: 0.15 10*3/uL — ABNORMAL HIGH (ref 0.00–0.07)
Basophils Absolute: 0 10*3/uL (ref 0.0–0.1)
Basophils Relative: 0 %
Eosinophils Absolute: 0 10*3/uL (ref 0.0–0.5)
Eosinophils Relative: 0 %
HCT: 35.9 % — ABNORMAL LOW (ref 36.0–46.0)
Hemoglobin: 12 g/dL (ref 12.0–15.0)
Immature Granulocytes: 1 %
Lymphocytes Relative: 5 %
Lymphs Abs: 0.8 10*3/uL (ref 0.7–4.0)
MCH: 28.3 pg (ref 26.0–34.0)
MCHC: 33.4 g/dL (ref 30.0–36.0)
MCV: 84.7 fL (ref 80.0–100.0)
Monocytes Absolute: 1 10*3/uL (ref 0.1–1.0)
Monocytes Relative: 6 %
Neutro Abs: 14.3 10*3/uL — ABNORMAL HIGH (ref 1.7–7.7)
Neutrophils Relative %: 88 %
Platelets: 186 10*3/uL (ref 150–400)
RBC: 4.24 MIL/uL (ref 3.87–5.11)
RDW: 12.7 % (ref 11.5–15.5)
WBC: 16.2 10*3/uL — ABNORMAL HIGH (ref 4.0–10.5)
nRBC: 0 % (ref 0.0–0.2)

## 2020-10-14 MED ORDER — ONDANSETRON HCL 4 MG/2ML IJ SOLN
4.0000 mg | Freq: Once | INTRAMUSCULAR | Status: AC
  Administered 2020-10-14: 4 mg via INTRAVENOUS
  Filled 2020-10-14: qty 2

## 2020-10-14 MED ORDER — LACTATED RINGERS IV BOLUS (SEPSIS)
1000.0000 mL | Freq: Once | INTRAVENOUS | Status: AC
  Administered 2020-10-14: 1000 mL via INTRAVENOUS

## 2020-10-14 MED ORDER — ACETAMINOPHEN 500 MG PO TABS
1000.0000 mg | ORAL_TABLET | Freq: Once | ORAL | Status: AC
Start: 2020-10-15 — End: 2020-10-14
  Administered 2020-10-14: 1000 mg via ORAL
  Filled 2020-10-14: qty 2

## 2020-10-14 MED ORDER — KETOROLAC TROMETHAMINE 30 MG/ML IJ SOLN
15.0000 mg | Freq: Once | INTRAMUSCULAR | Status: AC
Start: 2020-10-15 — End: 2020-10-14
  Administered 2020-10-14: 15 mg via INTRAVENOUS
  Filled 2020-10-14: qty 1

## 2020-10-14 NOTE — ED Triage Notes (Signed)
Reports generalized body aches and vomiting since yesterday.

## 2020-10-14 NOTE — ED Provider Notes (Signed)
[facility] EMERGENCY DEPT Provider Note   CSN: [id number] Arrival date & time: 10/14/20  2246     History Chief Complaint  Patient presents with   Abdominal Pain    Jennifer Dunlap is a 40 y.o. female.  Patient presents to the emergency department with fever, chills, generalized body aches, nausea, vomiting, diarrhea that began yesterday.  She reports that she cannot hold anything down, feels dehydrated.  Has a slight cough but no other URI symptoms.  Has not been vaccinated for COVID.      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   FH: ovarian cancer in first degree relative 08/16/2016   History of bilateral tubal ligation 02/16/2016   History of maternal pericarditis 02/20/2012    Past Surgical History:  Procedure Laterality Date   LAPAROSCOPIC TUBAL LIGATION Bilateral 10/12/2014   Procedure: LAPAROSCOPIC TUBAL LIGATION;  Surgeon: Woodroe Mode, MD;  Location: [facility];  Service: Gynecology;  Laterality: Bilateral;   THYROIDECTOMY Right 11/14/2016   Procedure: RIGHT THYROIDECTOMY;  Surgeon: Jerrell Belfast, MD;  Location: [facility];  Service: ENT;  Laterality: Right;     OB History     Gravida  10   Para  8   Term  8   Preterm  0   AB  2   Living  8      SAB  0   IAB  2   Ectopic  0   Multiple  0   Live Births  70           Family History  Problem Relation Age of Onset   Heart disease Father    Heart attack Father    Stroke Father    Cancer Sister 76       ovarian   Hypertension Mother    Heart attack Paternal Grandmother     Social History   Tobacco Use   Smoking status: Never   Smokeless tobacco: Never  Vaping Use   Vaping Use: Never used  Substance Use Topics   Alcohol use: No    Comment: occcasional   Drug use: No    Home  Medications Prior to Admission medications   Medication Sig Start Date End Date Taking? Authorizing Provider  cephALEXin (KEFLEX) 500 MG capsule Take 1 capsule (500 mg total) by mouth 4 (four) times daily. 10/15/20  Yes David Rodriquez, Gwenyth Allegra, MD  ondansetron (ZOFRAN) 4 MG tablet Take 1 tablet (4 mg total) by mouth every 6 (six) hours. 10/15/20  Yes Monna Crean, Gwenyth Allegra, MD  HYDROcodone-acetaminophen (NORCO) 5-325 MG tablet Take 1-2 tablets by mouth every 6 (six) hours as needed. 11/14/16   Jerrell Belfast, MD  ibuprofen (ADVIL,MOTRIN) 600 MG tablet Take 1 tablet (600 mg total) by mouth every 6 (six) hours. 10/09/16   Emily Filbert, MD    Allergies    Penicillins  Review of Systems   Review of Systems  Constitutional:  Positive for chills, fatigue and fever.  Gastrointestinal:  Positive for abdominal pain, diarrhea, nausea and vomiting.  Musculoskeletal:  Positive for myalgias.  All other systems reviewed and are negative.  Physical Exam Updated Vital Signs BP 113/64   Pulse 72   Temp (!) 102.6 F (39.2 C) (Oral)   Resp 18   Ht '5\' 4"'$  (  1.626 m)   Wt 62.1 kg   SpO2 100%   BMI 23.52 kg/m   Physical Exam Vitals and nursing note reviewed.  Constitutional:      General: She is not in acute distress.    Appearance: Normal appearance. She is well-developed.  HENT:     Head: Normocephalic and atraumatic.     Right Ear: Hearing normal.     Left Ear: Hearing normal.     Nose: Nose normal.     Mouth/Throat:     Mouth: Mucous membranes are dry.  Eyes:     Conjunctiva/sclera: Conjunctivae normal.     Pupils: Pupils are equal, round, and reactive to light.  Cardiovascular:     Rate and Rhythm: Regular rhythm.     Heart sounds: S1 normal and S2 normal. No murmur heard.   No friction rub. No gallop.  Pulmonary:     Effort: Pulmonary effort is normal. No respiratory distress.     Breath sounds: Normal breath sounds.  Chest:     Chest wall: No tenderness.  Abdominal:     General:  Bowel sounds are normal.     Palpations: Abdomen is soft.     Tenderness: There is no abdominal tenderness. There is no guarding or rebound. Negative signs include Murphy's sign and McBurney's sign.     Hernia: No hernia is present.  Musculoskeletal:        General: Normal range of motion.     Cervical back: Normal range of motion and neck supple.  Skin:    General: Skin is warm and dry.     Findings: No rash.  Neurological:     Mental Status: She is alert and oriented to person, place, and time.     GCS: GCS eye subscore is 4. GCS verbal subscore is 5. GCS motor subscore is 6.     Cranial Nerves: No cranial nerve deficit.     Sensory: No sensory deficit.     Coordination: Coordination normal.  Psychiatric:        Speech: Speech normal.        Behavior: Behavior normal.        Thought Content: Thought content normal.    ED Results / Procedures / Treatments   Labs (all labs ordered are listed, but only abnormal results are displayed) Labs Reviewed  COMPREHENSIVE METABOLIC PANEL - Abnormal; Notable for the following components:      Result Value   Sodium 132 (*)    Potassium 3.2 (*)    Glucose, Bld 106 (*)    AST 10 (*)    All other components within normal limits  CBC WITH DIFFERENTIAL/PLATELET - Abnormal; Notable for the following components:   WBC 16.2 (*)    HCT 35.9 (*)    Neutro Abs 14.3 (*)    Abs Immature Granulocytes 0.15 (*)    All other components within normal limits  URINALYSIS, ROUTINE W REFLEX MICROSCOPIC - Abnormal; Notable for the following components:   APPearance HAZY (*)    Hgb urine dipstick LARGE (*)    Ketones, ur 40 (*)    Protein, ur 100 (*)    Nitrite POSITIVE (*)    Leukocytes,Ua LARGE (*)    WBC, UA >50 (*)    Bacteria, UA RARE (*)    All other components within normal limits  LIPASE, BLOOD - Abnormal; Notable for the following components:   Lipase <10 (*)    All other components within normal limits  RESP  PANEL BY RT-PCR (FLU A&B, COVID)  ARPGX2  CULTURE, BLOOD (SINGLE)  URINE CULTURE  LACTIC ACID, PLASMA  PROTIME-INR  APTT  PREGNANCY, URINE    EKG None  Radiology CT ABDOMEN PELVIS W CONTRAST  Result Date: 10/15/2020 CLINICAL DATA:  Diffuse abdominal pain, fever, vomiting EXAM: CT ABDOMEN AND PELVIS WITH CONTRAST TECHNIQUE: Multidetector CT imaging of the abdomen and pelvis was performed using the standard protocol following bolus administration of intravenous contrast. CONTRAST:  41m OMNIPAQUE IOHEXOL 350 MG/ML SOLN COMPARISON:  None. FINDINGS: Lower chest: The visualized lung bases are clear. The visualized heart and pericardium are unremarkable. Hepatobiliary: Simple cyst noted within the left hepatic lobe. Liver otherwise unremarkable. No intra or extrahepatic biliary ductal dilation. Cholelithiasis without pericholecystic inflammatory change noted. Pancreas: Unremarkable Spleen: Unremarkable Adrenals/Urinary Tract: There is a heterogeneous enhancement pattern of the left kidney with associated mild perinephric inflammatory stranding and urothelial enhancement best appreciated involving the left renal pelvis most in keeping with unilateral pyelonephritis. The adrenal glands are unremarkable. The right kidney is unremarkable. There is no hydronephrosis. No intrarenal or ureteral calculi. No perinephric fluid collections. The bladder is decompressed. Stomach/Bowel: Stomach is within normal limits. Appendix appears normal. No evidence of bowel wall thickening, distention, or inflammatory changes. No free intraperitoneal gas or fluid. Vascular/Lymphatic: No significant vascular findings are present aside from those mentioned below. No enlarged abdominal or pelvic lymph nodes. Reproductive: Multiple enhancing masses within the uterus are compatible with multiple probable intramural fibroids. The left gonadal vein is dilated and there are multiple left adnexal varices present possibly reflecting changes of ovarian vein reflux and pelvic  venous insufficiency. The pelvic organs are otherwise unremarkable. Other: Tiny broad-based fat containing umbilical hernia. Rectum unremarkable. Musculoskeletal: No acute bone abnormality. IMPRESSION: Inflammatory changes involving the left kidney most in keeping with unilateral pyelonephritis. No superimposed nephro or urolithiasis. No hydronephrosis. No perinephric fluid collections. Correlation with urinalysis and urine culture may be helpful for further management. Cholelithiasis. Fibroid uterus. Dilated left ovarian vein with multiple left adnexal varices possibly reflecting changes of pelvic venous insufficiency. Correlation with clinical examination may be helpful for confirmation. Electronically Signed   By: AFidela SalisburyM.D.   On: 10/15/2020 02:01   DG Chest Port 1 View  Result Date: 10/14/2020 CLINICAL DATA:  Questionable sepsis - evaluate for abnormality Generalized body aches.  Vomiting. EXAM: PORTABLE CHEST 1 VIEW COMPARISON:  11/12/2016 FINDINGS: The cardiomediastinal contours are normal. The lungs are clear. Pulmonary vasculature is normal. No consolidation, pleural effusion, or pneumothorax. No acute osseous abnormalities are seen. IMPRESSION: Negative AP view of the chest. Electronically Signed   By: MKeith RakeM.D.   On: 10/14/2020 23:59    Procedures Procedures   Medications Ordered in ED Medications  cefTRIAXone (ROCEPHIN) 2 g in sodium chloride 0.9 % 100 mL IVPB (has no administration in time range)  lactated ringers bolus 1,000 mL (0 mLs Intravenous Stopped 10/15/20 0121)  ondansetron (ZOFRAN) injection 4 mg (4 mg Intravenous Given 10/14/20 2356)  acetaminophen (TYLENOL) tablet 1,000 mg (1,000 mg Oral Given 10/14/20 2357)  ketorolac (TORADOL) 30 MG/ML injection 15 mg (15 mg Intravenous Given 10/14/20 2356)  morphine 4 MG/ML injection 4 mg (4 mg Intravenous Given 10/15/20 0118)  metoCLOPramide (REGLAN) injection 10 mg (10 mg Intravenous Given 10/15/20 0118)  iohexol (OMNIPAQUE) 350  MG/ML injection 75 mL (75 mLs Intravenous Contrast Given 10/15/20 0141)    ED Course  I have reviewed the triage vital signs and the nursing notes.  Pertinent labs &  imaging results that were available during my care of the patient were reviewed by me and considered in my medical decision making (see chart for details).    MDM Rules/Calculators/A&P                           Patient presents to the emergency department with fever, chills, generalized body aches with some specific abdominal complaints.  She has had vomiting and cannot hold anything down.  Patient does appear dehydrated on arrival.  She was given IV fluids and work-up initiated.  Patient does have evidence of urinary tract infection.  CT scan confirms left-sided pyelonephritis, no other acute abnormality.  Patient is feeling better after treatment.  She is holding down oral liquids.  Patient has penicillin listed as an allergy with anaphylaxis.  I do not believe this is accurate.  Patient tells me that her mother has an allergy to penicillin and she was told not to take it because she was "blue baby" at birth.  It does not sound like there was ever an actual allergy.  Patient given Rocephin here in the department and monitored, no problems.  Will discharge with continued cephalosporin outpatient.  Given return instructions.  Final Clinical Impression(s) / ED Diagnoses Final diagnoses:  Pyelonephritis    Rx / DC Orders ED Discharge Orders          Ordered    cephALEXin (KEFLEX) 500 MG capsule  4 times daily        10/15/20 0235    ondansetron (ZOFRAN) 4 MG tablet  Every 6 hours        10/15/20 0235             Orpah Greek, MD 10/15/20 [phone]

## 2020-10-15 ENCOUNTER — Emergency Department (HOSPITAL_BASED_OUTPATIENT_CLINIC_OR_DEPARTMENT_OTHER): Payer: Self-pay

## 2020-10-15 ENCOUNTER — Encounter (HOSPITAL_BASED_OUTPATIENT_CLINIC_OR_DEPARTMENT_OTHER): Payer: Self-pay | Admitting: Radiology

## 2020-10-15 LAB — COMPREHENSIVE METABOLIC PANEL
ALT: 10 U/L (ref 0–44)
AST: 10 U/L — ABNORMAL LOW (ref 15–41)
Albumin: 4 g/dL (ref 3.5–5.0)
Alkaline Phosphatase: 59 U/L (ref 38–126)
Anion gap: 12 (ref 5–15)
BUN: 9 mg/dL (ref 6–20)
CO2: 22 mmol/L (ref 22–32)
Calcium: 10.1 mg/dL (ref 8.9–10.3)
Chloride: 98 mmol/L (ref 98–111)
Creatinine, Ser: 0.88 mg/dL (ref 0.44–1.00)
GFR, Estimated: >60 mL/min (ref >60)
Glucose, Bld: 106 mg/dL — ABNORMAL HIGH (ref 70–99)
Potassium: 3.2 mmol/L — ABNORMAL LOW (ref 3.5–5.1)
Sodium: 132 mmol/L — ABNORMAL LOW (ref 135–145)
Total Bilirubin: 0.9 mg/dL (ref 0.3–1.2)
Total Protein: 7.3 g/dL (ref 6.5–8.1)

## 2020-10-15 LAB — URINALYSIS, ROUTINE W REFLEX MICROSCOPIC
Bilirubin Urine: NEGATIVE
Glucose, UA: NEGATIVE mg/dL
Ketones, ur: 40 mg/dL — AB
Nitrite: POSITIVE — AB
Protein, ur: 100 mg/dL — AB
Specific Gravity, Urine: 1.022 (ref 1.005–1.030)
WBC, UA: >50 WBC/hpf — ABNORMAL HIGH (ref 0–5)
pH: 6 (ref 5.0–8.0)

## 2020-10-15 LAB — LACTIC ACID, PLASMA: Lactic Acid, Venous: 1.4 mmol/L (ref 0.5–1.9)

## 2020-10-15 LAB — PREGNANCY, URINE: Preg Test, Ur: NEGATIVE

## 2020-10-15 LAB — RESP PANEL BY RT-PCR (FLU A&B, COVID) ARPGX2
Influenza A by PCR: NEGATIVE
Influenza B by PCR: NEGATIVE
SARS Coronavirus 2 by RT PCR: NEGATIVE

## 2020-10-15 LAB — LIPASE, BLOOD: Lipase: <10 U/L — ABNORMAL LOW (ref 11–51)

## 2020-10-15 LAB — PROTIME-INR
INR: 1.2 (ref 0.8–1.2)
Prothrombin Time: 15 seconds (ref 11.4–15.2)

## 2020-10-15 LAB — APTT: aPTT: 32 seconds (ref 24–36)

## 2020-10-15 MED ORDER — MORPHINE SULFATE (PF) 4 MG/ML IV SOLN
4.0000 mg | Freq: Once | INTRAVENOUS | Status: AC
  Administered 2020-10-15: 4 mg via INTRAVENOUS
  Filled 2020-10-15: qty 1

## 2020-10-15 MED ORDER — ONDANSETRON HCL 4 MG PO TABS: 4.0000 mg | ORAL_TABLET | Freq: Four times a day (QID) | ORAL | 0 refills | Status: DC

## 2020-10-15 MED ORDER — SODIUM CHLORIDE 0.9 % IV SOLN
2.0000 g | Freq: Once | INTRAVENOUS | Status: AC
  Administered 2020-10-15: 2 g via INTRAVENOUS
  Filled 2020-10-15: qty 20

## 2020-10-15 MED ORDER — IOHEXOL 350 MG/ML SOLN
75.0000 mL | Freq: Once | INTRAVENOUS | Status: AC | PRN
  Administered 2020-10-15: 75 mL via INTRAVENOUS

## 2020-10-15 MED ORDER — CEPHALEXIN 500 MG PO CAPS: 500.0000 mg | ORAL_CAPSULE | Freq: Four times a day (QID) | ORAL | 0 refills | Status: DC

## 2020-10-15 MED ORDER — METOCLOPRAMIDE HCL 5 MG/ML IJ SOLN
10.0000 mg | Freq: Once | INTRAMUSCULAR | Status: AC
  Administered 2020-10-15: 10 mg via INTRAVENOUS
  Filled 2020-10-15: qty 2

## 2020-10-16 ENCOUNTER — Telehealth (HOSPITAL_BASED_OUTPATIENT_CLINIC_OR_DEPARTMENT_OTHER): Payer: Self-pay | Admitting: Emergency Medicine

## 2020-10-16 ENCOUNTER — Emergency Department (HOSPITAL_COMMUNITY)
Admission: EM | Admit: 2020-10-16 | Discharge: 2020-10-16 | Disposition: A | Discharge status: Home / Self Care | Payer: Self-pay | Attending: Emergency Medicine | Admitting: Emergency Medicine

## 2020-10-16 ENCOUNTER — Encounter (HOSPITAL_COMMUNITY): Payer: Self-pay | Admitting: Emergency Medicine

## 2020-10-16 ENCOUNTER — Other Ambulatory Visit: Payer: Self-pay

## 2020-10-16 DIAGNOSIS — N12 Tubulo-interstitial nephritis, not specified as acute or chronic: Secondary | ICD-10-CM | POA: Insufficient documentation

## 2020-10-16 DIAGNOSIS — R630 Anorexia: Secondary | ICD-10-CM | POA: Insufficient documentation

## 2020-10-16 DIAGNOSIS — M791 Myalgia, unspecified site: Secondary | ICD-10-CM | POA: Insufficient documentation

## 2020-10-16 DIAGNOSIS — R52 Pain, unspecified: Secondary | ICD-10-CM

## 2020-10-16 DIAGNOSIS — N9489 Other specified conditions associated with female genital organs and menstrual cycle: Secondary | ICD-10-CM | POA: Insufficient documentation

## 2020-10-16 DIAGNOSIS — R197 Diarrhea, unspecified: Secondary | ICD-10-CM | POA: Insufficient documentation

## 2020-10-16 DIAGNOSIS — E876 Hypokalemia: Secondary | ICD-10-CM | POA: Insufficient documentation

## 2020-10-16 LAB — URINALYSIS, ROUTINE W REFLEX MICROSCOPIC
Bilirubin Urine: NEGATIVE
Glucose, UA: NEGATIVE mg/dL
Ketones, ur: NEGATIVE mg/dL
Nitrite: NEGATIVE
Protein, ur: 30 mg/dL — AB
Specific Gravity, Urine: <1.005 — ABNORMAL LOW (ref 1.005–1.030)
pH: 6.5 (ref 5.0–8.0)

## 2020-10-16 LAB — BLOOD CULTURE ID PANEL (REFLEXED) - BCID2

## 2020-10-16 LAB — CBC WITH DIFFERENTIAL/PLATELET
Abs Immature Granulocytes: 0.15 10*3/uL — ABNORMAL HIGH (ref 0.00–0.07)
Basophils Absolute: 0 10*3/uL (ref 0.0–0.1)
Basophils Relative: 0 %
Eosinophils Absolute: 0 10*3/uL (ref 0.0–0.5)
Eosinophils Relative: 0 %
HCT: 31.8 % — ABNORMAL LOW (ref 36.0–46.0)
Hemoglobin: 11.1 g/dL — ABNORMAL LOW (ref 12.0–15.0)
Immature Granulocytes: 1 %
Lymphocytes Relative: 9 %
Lymphs Abs: 1.4 10*3/uL (ref 0.7–4.0)
MCH: 29.1 pg (ref 26.0–34.0)
MCHC: 34.9 g/dL (ref 30.0–36.0)
MCV: 83.2 fL (ref 80.0–100.0)
Monocytes Absolute: 1 10*3/uL (ref 0.1–1.0)
Monocytes Relative: 7 %
Neutro Abs: 12.5 10*3/uL — ABNORMAL HIGH (ref 1.7–7.7)
Neutrophils Relative %: 83 %
Platelets: 208 10*3/uL (ref 150–400)
RBC: 3.82 MIL/uL — ABNORMAL LOW (ref 3.87–5.11)
RDW: 13 % (ref 11.5–15.5)
WBC: 15 10*3/uL — ABNORMAL HIGH (ref 4.0–10.5)
nRBC: 0 % (ref 0.0–0.2)

## 2020-10-16 LAB — COMPREHENSIVE METABOLIC PANEL
ALT: 15 U/L (ref 0–44)
AST: 13 U/L — ABNORMAL LOW (ref 15–41)
Albumin: 3.1 g/dL — ABNORMAL LOW (ref 3.5–5.0)
Alkaline Phosphatase: 59 U/L (ref 38–126)
Anion gap: 11 (ref 5–15)
BUN: 8 mg/dL (ref 6–20)
CO2: 23 mmol/L (ref 22–32)
Calcium: 9.6 mg/dL (ref 8.9–10.3)
Chloride: 100 mmol/L (ref 98–111)
Creatinine, Ser: 0.81 mg/dL (ref 0.44–1.00)
GFR, Estimated: >60 mL/min (ref >60)
Glucose, Bld: 105 mg/dL — ABNORMAL HIGH (ref 70–99)
Potassium: 2.5 mmol/L — CL (ref 3.5–5.1)
Sodium: 134 mmol/L — ABNORMAL LOW (ref 135–145)
Total Bilirubin: 0.7 mg/dL (ref 0.3–1.2)
Total Protein: 7.2 g/dL (ref 6.5–8.1)

## 2020-10-16 LAB — CK: Total CK: 20 U/L — ABNORMAL LOW (ref 38–234)

## 2020-10-16 LAB — PREGNANCY, URINE: Preg Test, Ur: NEGATIVE

## 2020-10-16 LAB — MAGNESIUM: Magnesium: 2 mg/dL (ref 1.7–2.4)

## 2020-10-16 MED ORDER — POTASSIUM CHLORIDE 10 MEQ/100ML IV SOLN
10.0000 meq | INTRAVENOUS | Status: AC
  Administered 2020-10-16 (×2): 10 meq via INTRAVENOUS
  Filled 2020-10-16 (×2): qty 100

## 2020-10-16 MED ORDER — ONDANSETRON 4 MG PO TBDP
4.0000 mg | ORAL_TABLET | Freq: Once | ORAL | Status: AC
  Administered 2020-10-16: 4 mg via ORAL
  Filled 2020-10-16: qty 1

## 2020-10-16 MED ORDER — MORPHINE SULFATE (PF) 4 MG/ML IV SOLN
4.0000 mg | Freq: Once | INTRAVENOUS | Status: AC
  Administered 2020-10-16: 4 mg via INTRAVENOUS
  Filled 2020-10-16: qty 1

## 2020-10-16 MED ORDER — KETOROLAC TROMETHAMINE 30 MG/ML IJ SOLN
30.0000 mg | Freq: Once | INTRAMUSCULAR | Status: AC
  Administered 2020-10-16: 30 mg via INTRAVENOUS
  Filled 2020-10-16: qty 1

## 2020-10-16 MED ORDER — SODIUM CHLORIDE 0.9 % IV BOLUS
2000.0000 mL | Freq: Once | INTRAVENOUS | Status: AC
  Administered 2020-10-16: 2000 mL via INTRAVENOUS

## 2020-10-16 MED ORDER — POTASSIUM CHLORIDE CRYS ER 20 MEQ PO TBCR
40.0000 meq | EXTENDED_RELEASE_TABLET | Freq: Once | ORAL | Status: AC
  Administered 2020-10-16: 40 meq via ORAL
  Filled 2020-10-16: qty 2

## 2020-10-16 MED ORDER — ONDANSETRON 4 MG PO TBDP: ORAL_TABLET | ORAL | 0 refills | Status: DC

## 2020-10-16 MED ORDER — POTASSIUM CHLORIDE ER 20 MEQ PO TBCR: 20.0000 meq | EXTENDED_RELEASE_TABLET | Freq: Every day | ORAL | 0 refills | Status: DC

## 2020-10-16 NOTE — ED Notes (Signed)
Patient was able to tolerate crackers and ginger ale.  Requesting to have more food.  Provided with a sandwich.

## 2020-10-16 NOTE — Discharge Instructions (Addendum)
Take potassium tablets for the next 5 days, you will need to follow-up with PCP or urgent care in about a week for potassium recheck.  Please make sure you are staying well-hydrated.  Use Zofran every 4 hours for the next 24 hours and then as needed for nausea and vomiting.  Please continue your antibiotics.  Take Tylenol 1000 mg every 6 hours and ibuprofen 600 mg every 6 hours to help with pain, fever and body aches.  Return to the emergency department for any new or worsening symptoms.

## 2020-10-16 NOTE — ED Notes (Signed)
Patient provided with crackers and ginger ale for PO challenge

## 2020-10-16 NOTE — ED Triage Notes (Signed)
Pt BIBA. Per EMS pt c/o low grade fever and body aches all over. Pt was just seen at [facility] on 9/2 for same. Pt was dx with kidney infection and was given an antibiotic prescription.   109/60 HR -90 T-99.1 F 97% room air

## 2020-10-17 LAB — URINE CULTURE: Culture: >=100000 — AB

## 2020-10-18 LAB — CULTURE, BLOOD (SINGLE): Special Requests: ADEQUATE

## 2020-10-18 NOTE — ED Provider Notes (Signed)
[facility] DEPT Provider Note   CSN: [id number] Arrival date & time: 10/16/20  1442     History Chief Complaint  Patient presents with   Generalized Body Aches    Jennifer Dunlap is a 40 y.o. female.  Jennifer Dunlap is a 41 y.o. female with a history of pericarditis, who returns to the emergency department via EMS complaining of generalized body aches, and low-grade fever.  Patient was seen at [facility] ED for similar symptoms on 9/2, diagnosed with pyelonephritis, treated with IV fluids and antibiotics and symptoms were improved, discharged home on Keflex.  Patient reports that she has been taking her antibiotics over the past 2 days but feels like her symptoms are not improving.  She reports she never had significant dysuria, currently continues to deny any urinary symptoms, her biggest complaint is just feeling generally weak and malaised with diffuse body aches.  She reports that she has had nausea and vomiting, has been able to keep down her antibiotics for the most part but reports she has had very little appetite and not been able to keep much down otherwise.  She reports a few episodes of diarrhea.  Thinks she may be continuing to have low-grade fevers, denies any associated cough, rhinorrhea, chest pain or shortness of breath and had negative COVID testing on 9/2.  Reports she has been taking Tylenol regularly and has also intermittently taken ibuprofen for symptoms without much relief.  No other aggravating or alleviating factors.  The history is provided by the patient and medical records.      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   FH: ovarian cancer in first degree relative 08/16/2016   History of bilateral tubal ligation 02/16/2016   History of maternal pericarditis  02/20/2012    Past Surgical History:  Procedure Laterality Date   LAPAROSCOPIC TUBAL LIGATION Bilateral 10/12/2014   Procedure: LAPAROSCOPIC TUBAL LIGATION;  Surgeon: Woodroe Mode, MD;  Location: [facility];  Service: Gynecology;  Laterality: Bilateral;   THYROIDECTOMY Right 11/14/2016   Procedure: RIGHT THYROIDECTOMY;  Surgeon: Jerrell Belfast, MD;  Location: [facility];  Service: ENT;  Laterality: Right;     OB History     Gravida  10   Para  8   Term  8   Preterm  0   AB  2   Living  8      SAB  0   IAB  2   Ectopic  0   Multiple  0   Live Births  75           Family History  Problem Relation Age of Onset   Heart disease Father    Heart attack Father    Stroke Father    Cancer Sister 21       ovarian   Hypertension Mother    Heart attack Paternal Grandmother     Social History   Tobacco Use   Smoking status: Never   Smokeless tobacco: Never  Vaping Use   Vaping Use: Never used  Substance Use Topics   Alcohol use: No    Comment: occcasional   Drug use: No    Home Medications Prior to Admission medications   Medication Sig Start Date End Date Taking? Authorizing Provider  ondansetron (  ZOFRAN ODT) 4 MG disintegrating tablet '4mg'$  ODT q4 hours prn nausea/vomit 10/16/20  Yes Riverlyn Kizziah N, PA-C  potassium chloride 20 MEQ TBCR Take 20 mEq by mouth daily for 5 days. 10/16/20 10/21/20 Yes Jacqlyn Larsen, PA-C  cephALEXin (KEFLEX) 500 MG capsule Take 1 capsule (500 mg total) by mouth 4 (four) times daily. 10/15/20   Orpah Greek, MD  HYDROcodone-acetaminophen (NORCO) 5-325 MG tablet Take 1-2 tablets by mouth every 6 (six) hours as needed. 11/14/16   Jerrell Belfast, MD  ibuprofen (ADVIL,MOTRIN) 600 MG tablet Take 1 tablet (600 mg total) by mouth every 6 (six) hours. 10/09/16   Emily Filbert, MD  ondansetron (ZOFRAN) 4 MG tablet Take 1 tablet (4 mg total) by mouth every 6 (six) hours. 10/15/20   Orpah Greek, MD    Allergies     Penicillins  Review of Systems   Review of Systems  Constitutional:  Positive for chills and fatigue. Negative for fever.  HENT: Negative.    Respiratory:  Negative for cough and shortness of breath.   Cardiovascular:  Negative for chest pain.  Gastrointestinal:  Positive for diarrhea, nausea and vomiting. Negative for abdominal pain and blood in stool.  Genitourinary:  Negative for dysuria, flank pain and hematuria.  Musculoskeletal:  Positive for arthralgias, back pain and myalgias.  Skin:  Negative for color change and rash.  Neurological:  Positive for weakness (Generalized) and light-headedness. Negative for dizziness and syncope.  All other systems reviewed and are negative.  Physical Exam Updated Vital Signs BP 122/81   Pulse 61   Temp 98.6 F (37 C) (Oral)   Resp 16   Ht '5\' 4"'$  (1.626 m)   Wt 62.1 kg   SpO2 100%   BMI 23.52 kg/m   Physical Exam Vitals and nursing note reviewed.  Constitutional:      General: She is not in acute distress.    Appearance: Normal appearance. She is well-developed. She is ill-appearing. She is not diaphoretic.     Comments: Alert, somewhat appearing but in no acute distress  HENT:     Head: Normocephalic and atraumatic.     Mouth/Throat:     Comments: Mucous membranes slightly dry Eyes:     General:        Right eye: No discharge.        Left eye: No discharge.     Pupils: Pupils are equal, round, and reactive to light.  Cardiovascular:     Rate and Rhythm: Normal rate and regular rhythm.     Pulses: Normal pulses.     Heart sounds: Normal heart sounds. No murmur heard.   No friction rub. No gallop.  Pulmonary:     Effort: Pulmonary effort is normal. No respiratory distress.     Breath sounds: Normal breath sounds. No wheezing or rales.     Comments: Respirations equal and unlabored, patient able to speak in full sentences, lungs clear to auscultation bilaterally  Abdominal:     General: Bowel sounds are normal. There is no  distension.     Palpations: Abdomen is soft. There is no mass.     Tenderness: There is no abdominal tenderness. There is no guarding.     Comments: Abdomen soft, nondistended, nontender to palpation in all quadrants without guarding or peritoneal signs, no CVA tenderness bilaterally  Musculoskeletal:        General: No deformity.     Cervical back: Neck supple.  Skin:    General:  Skin is warm and dry.     Capillary Refill: Capillary refill takes less than 2 seconds.  Neurological:     Mental Status: She is alert and oriented to person, place, and time.     Coordination: Coordination normal.     Comments: Speech is clear, able to follow commands Moves extremities without ataxia, coordination intact  Psychiatric:        Mood and Affect: Mood normal.        Behavior: Behavior normal.    ED Results / Procedures / Treatments   Labs (all labs ordered are listed, but only abnormal results are displayed) Labs Reviewed  COMPREHENSIVE METABOLIC PANEL - Abnormal; Notable for the following components:      Result Value   Sodium 134 (*)    Potassium 2.5 (*)    Glucose, Bld 105 (*)    Albumin 3.1 (*)    AST 13 (*)    All other components within normal limits  CBC WITH DIFFERENTIAL/PLATELET - Abnormal; Notable for the following components:   WBC 15.0 (*)    RBC 3.82 (*)    Hemoglobin 11.1 (*)    HCT 31.8 (*)    Neutro Abs 12.5 (*)    Abs Immature Granulocytes 0.15 (*)    All other components within normal limits  CK - Abnormal; Notable for the following components:   Total CK 20 (*)    All other components within normal limits  URINALYSIS, ROUTINE W REFLEX MICROSCOPIC - Abnormal; Notable for the following components:   Color, Urine YELLOW (*)    APPearance CLEAR (*)    Specific Gravity, Urine <1.005 (*)    Hgb urine dipstick LARGE (*)    Protein, ur 30 (*)    Leukocytes,Ua SMALL (*)    Bacteria, UA RARE (*)    All other components within normal limits  MAGNESIUM  PREGNANCY,  URINE  I-STAT BETA HCG BLOOD, ED ([facility], [facility], [facility] ONLY)    EKG None  Radiology No results found.  Procedures Procedures   Medications Ordered in ED Medications  sodium chloride 0.9 % bolus 2,000 mL (0 mLs Intravenous Stopped 10/16/20 1807)  ondansetron (ZOFRAN-ODT) disintegrating tablet 4 mg (4 mg Oral Given 10/16/20 1608)  morphine 4 MG/ML injection 4 mg (4 mg Intravenous Given 10/16/20 1608)  potassium chloride 10 mEq in 100 mL IVPB (0 mEq Intravenous Stopped 10/16/20 2019)  potassium chloride SA (KLOR-CON) CR tablet 40 mEq (40 mEq Oral Given 10/16/20 1741)  ketorolac (TORADOL) 30 MG/ML injection 30 mg (30 mg Intravenous Given 10/16/20 1818)    ED Course  I have reviewed the triage vital signs and the nursing notes.  Pertinent labs & imaging results that were available during my care of the patient were reviewed by me and considered in my medical decision making (see chart for details).    MDM Rules/Calculators/A&P                          Patient returns to the emergency department with persistent chills, fatigue and body aches, was seen in the ED 2 days ago with similar symptoms and was diagnosed with pyelonephritis and discharged on antibiotics.  She was given IV fluids for dehydration, patient reports that she is continue to have vomiting at home, has for the most part been able to keep down antibiotics but is feeling generally weak with diffuse body aches and chills.  Will repeat lab work and urinalysis, 2 L IV  fluid bolus ordered as well as medication for pain and nausea.  Currently patient is afebrile and vitals are reassuring.  I have independently ordered, reviewed and interpreted all labs and imaging: CBC: Leukocytosis is slightly improved, hemoglobin of 11.1 CMP: Significant for potassium of 2.5, was 3.22 days ago, patient did not have any potassium replacement and has continued to have vomiting and a few episodes of diarrhea leading to continued potassium losses as well as decreased  p.o. intake, patient given p.o. and IV potassium replacement, renal and liver function are normal and no other significant electrolyte derangements Magnesium is WNL UA: Still with small leukocytes and some RBCs and WBCs but overall significantly improved compared to urinalysis 2 days ago. CK: WNL  After IV fluids and symptomatic management as well as potassium replacement patient is feeling much better.  For the first time in a few days she is actually having an appetite and has been able to tolerate p.o. food and fluids here in the emergency department.  Vitals remained stable.  Given that patient is feeling so much better feel it is reasonable to discharge home with continued p.o. potassium replacement and close follow-up, will have her continue taking her antibiotics for her pyelonephritis as well.  Strict return precautions discussed.  Patient expresses understanding and agreement.  Discharged home in good condition.  Final Clinical Impression(s) / ED Diagnoses Final diagnoses:  Hypokalemia  Generalized body aches  Pyelonephritis    Rx / DC Orders ED Discharge Orders          Ordered    potassium chloride 20 MEQ TBCR  Daily        10/16/20 2207    ondansetron (ZOFRAN ODT) 4 MG disintegrating tablet        10/16/20 2207             Jacqlyn Larsen, [place] 10/19/20 0507    Tegeler, Gwenyth Allegra, MD 10/20/20 1113

## 2021-01-17 ENCOUNTER — Emergency Department (HOSPITAL_BASED_OUTPATIENT_CLINIC_OR_DEPARTMENT_OTHER): Payer: Self-pay | Admitting: Radiology

## 2021-01-17 ENCOUNTER — Encounter (HOSPITAL_BASED_OUTPATIENT_CLINIC_OR_DEPARTMENT_OTHER): Payer: Self-pay | Admitting: Emergency Medicine

## 2021-01-17 ENCOUNTER — Other Ambulatory Visit (HOSPITAL_BASED_OUTPATIENT_CLINIC_OR_DEPARTMENT_OTHER): Payer: Self-pay

## 2021-01-17 ENCOUNTER — Emergency Department (HOSPITAL_BASED_OUTPATIENT_CLINIC_OR_DEPARTMENT_OTHER)
Admission: EM | Admit: 2021-01-17 | Discharge: 2021-01-17 | Disposition: A | Discharge status: Home / Self Care | Payer: Self-pay | Attending: Emergency Medicine | Admitting: Emergency Medicine

## 2021-01-17 ENCOUNTER — Other Ambulatory Visit: Payer: Self-pay

## 2021-01-17 DIAGNOSIS — X500XXA Overexertion from strenuous movement or load, initial encounter: Secondary | ICD-10-CM | POA: Insufficient documentation

## 2021-01-17 DIAGNOSIS — Y92219 Unspecified school as the place of occurrence of the external cause: Secondary | ICD-10-CM | POA: Insufficient documentation

## 2021-01-17 DIAGNOSIS — Z8742 Personal history of other diseases of the female genital tract: Secondary | ICD-10-CM | POA: Insufficient documentation

## 2021-01-17 DIAGNOSIS — S8392XA Sprain of unspecified site of left knee, initial encounter: Secondary | ICD-10-CM | POA: Insufficient documentation

## 2021-01-17 MED ORDER — KETOROLAC TROMETHAMINE 30 MG/ML IJ SOLN
30.0000 mg | Freq: Once | INTRAMUSCULAR | Status: AC
  Administered 2021-01-17: 30 mg via INTRAMUSCULAR
  Filled 2021-01-17: qty 1

## 2021-01-17 MED ORDER — IBUPROFEN 600 MG PO TABS: 600.0000 mg | ORAL_TABLET | Freq: Four times a day (QID) | ORAL | 0 refills | Status: DC

## 2021-01-17 NOTE — ED Triage Notes (Signed)
States was sitting at table and went to scoot up and felt her  left knee pop now swollen and cannot bear weight , hard to straighten tender to touch

## 2021-01-17 NOTE — ED Provider Notes (Signed)
[facility] Provider Note  CSN: [id number] Arrival date & time: 01/17/21 1323    History Chief Complaint  Patient presents with   Knee Pain    Jennifer Dunlap is a 40 y.o. female reports earlier today while scooting her chair up at school she felt a pop in her L anterior knee, pain with movement, unable to straighten the knee. No falls or direct trauma.    Past Medical History:  Diagnosis Date   Benign tumor of thyroid gland    Fibroid    Pericarditis 2011    Past Surgical History:  Procedure Laterality Date   LAPAROSCOPIC TUBAL LIGATION Bilateral 10/12/2014   Procedure: LAPAROSCOPIC TUBAL LIGATION;  Surgeon: Woodroe Mode, MD;  Location: [facility];  Service: Gynecology;  Laterality: Bilateral;   THYROIDECTOMY Right 11/14/2016   Procedure: RIGHT THYROIDECTOMY;  Surgeon: Jerrell Belfast, MD;  Location: [facility];  Service: ENT;  Laterality: Right;    Family History  Problem Relation Age of Onset   Heart disease Father    Heart attack Father    Stroke Father    Cancer Sister 31       ovarian   Hypertension Mother    Heart attack Paternal Grandmother     Social History   Tobacco Use   Smoking status: Never   Smokeless tobacco: Never  Vaping Use   Vaping Use: Never used  Substance Use Topics   Alcohol use: No    Comment: occcasional   Drug use: No     Home Medications Prior to Admission medications   Medication Sig Start Date End Date Taking? Authorizing Provider  ibuprofen (ADVIL) 600 MG tablet Take 1 tablet (600 mg total) by mouth every 6 (six) hours. 01/17/21   Truddie Hidden, MD     Allergies    Penicillins   Review of Systems   Review of Systems A comprehensive review of systems was completed and negative except as noted in HPI.    Physical Exam BP 111/79 (BP Location: Left Arm)   Pulse 65   Temp 98.2 F (36.8 C)   Resp 12   Ht 5\' 3"  (1.6 m)   Wt 63.5 kg   LMP 01/04/2021 (Approximate)   SpO2 100%   BMI 24.80  kg/m   Physical Exam Vitals and nursing note reviewed.  HENT:     Head: Normocephalic.     Nose: Nose normal.  Eyes:     Extraocular Movements: Extraocular movements intact.  Pulmonary:     Effort: Pulmonary effort is normal.  Musculoskeletal:     Cervical back: Neck supple.     Comments: Decreased ROM of L knee unable to tolerate exam. Mild tenderness over the inferior patellar ligament  Skin:    Findings: No rash (on exposed skin).  Neurological:     Mental Status: She is alert and oriented to person, place, and time.  Psychiatric:        Mood and Affect: Mood normal.     ED Results / Procedures / Treatments   Labs (all labs ordered are listed, but only abnormal results are displayed) Labs Reviewed - No data to display  EKG None   Radiology DG Knee Complete 4 Views Left  Result Date: 01/17/2021 CLINICAL DATA:  Knee injury with pain and swelling. EXAM: LEFT KNEE - COMPLETE 4+ VIEW COMPARISON:  None. FINDINGS: No fracture. No subluxation or dislocation. No joint effusion. No worrisome lytic or sclerotic osseous abnormality. IMPRESSION: Negative. Electronically Signed  By: Misty Stanley M.D.   On: 01/17/2021 14:16    Procedures Procedures  Medications Ordered in the ED Medications  ketorolac (TORADOL) 30 MG/ML injection 30 mg (has no administration in time range)     MDM Rules/Calculators/A&P MDM  Xray is neg. Plan knee brace, NSAIDs, crutches and ortho follow up.  ED Course  I have reviewed the triage vital signs and the nursing notes.  Pertinent labs & imaging results that were available during my care of the patient were reviewed by me and considered in my medical decision making (see chart for details).     Final Clinical Impression(s) / ED Diagnoses Final diagnoses:  Sprain of left knee, unspecified ligament, initial encounter    Rx / DC Orders ED Discharge Orders          Ordered    ibuprofen (ADVIL) 600 MG tablet  Every 6 hours         01/17/21 1605             Truddie Hidden, MD 01/17/21 1605

## 2021-01-30 ENCOUNTER — Telehealth: Payer: Self-pay | Admitting: Orthopaedic Surgery

## 2021-01-30 NOTE — Telephone Encounter (Signed)
Pt called and was seen in [facility] ED for sprained left knee. Dr. Eddie Dibbles schedule shows he has no openings. Please call pt and open slot possible 12/22. Please call pt at [phone].

## 2021-02-02 ENCOUNTER — Telehealth: Payer: Self-pay | Admitting: Orthopaedic Surgery

## 2021-02-02 ENCOUNTER — Ambulatory Visit (HOSPITAL_BASED_OUTPATIENT_CLINIC_OR_DEPARTMENT_OTHER): Payer: Self-pay | Admitting: Orthopaedic Surgery

## 2021-02-02 NOTE — Telephone Encounter (Signed)
Pt called and needs to get to in. Was seen in ED on 12/06 for a sprained knee. Can we work her in this afternoon?  CB [phone]

## 2021-02-07 ENCOUNTER — Ambulatory Visit (INDEPENDENT_AMBULATORY_CARE_PROVIDER_SITE_OTHER): Payer: 59 | Admitting: Orthopaedic Surgery

## 2021-02-07 ENCOUNTER — Other Ambulatory Visit: Payer: Self-pay

## 2021-02-07 DIAGNOSIS — M25562 Pain in left knee: Secondary | ICD-10-CM | POA: Diagnosis not present

## 2021-02-07 NOTE — Progress Notes (Signed)
Chief Complaint: Left knee pain     History of Present Illness:    Jennifer Dunlap is a 40 y.o. 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  Since that time she was having swelling and difficulty placing weight on the leg.  She denies any pain with ambulation.  She has been using a hinged brace from the emergency room.  She is a Theatre manager    Surgical History:   None  PMH/PSH/Family History/Social History/Meds/Allergies:    Past Medical History:  Diagnosis Date   Benign tumor of thyroid gland    Fibroid    Pericarditis 2011   Past Surgical History:  Procedure Laterality Date   LAPAROSCOPIC TUBAL LIGATION Bilateral 10/12/2014   Procedure: LAPAROSCOPIC TUBAL LIGATION;  Surgeon: Woodroe Mode, MD;  Location: [facility];  Service: Gynecology;  Laterality: Bilateral;   THYROIDECTOMY Right 11/14/2016   Procedure: RIGHT THYROIDECTOMY;  Surgeon: Jerrell Belfast, MD;  Location: [facility];  Service: ENT;  Laterality: Right;   Social History   Socioeconomic History   Marital status: Single    Spouse name: Not on file   Number of children: Not on file   Years of education: Not on file   Highest education level: Not on file  Occupational History   Not on file  Tobacco Use   Smoking status: Never   Smokeless tobacco: Never  Vaping Use   Vaping Use: Never used  Substance and Sexual Activity   Alcohol use: No    Comment: occcasional   Drug use: No   Sexual activity: Not Currently    Birth control/protection: Surgical  Other Topics Concern   Not on file  Social History Narrative   Not on file   Social Determinants of Health   Financial Resource Strain: Not on file  Food Insecurity: Not on file  Transportation Needs: Not on file  Physical Activity: Not on file  Stress: Not on file  Social Connections: Not on file   Family History  Problem Relation Age of Onset   Heart disease  Father    Heart attack Father    Stroke Father    Cancer Sister 79       ovarian   Hypertension Mother    Heart attack Paternal Grandmother    Allergies  Allergen Reactions   Penicillins Anaphylaxis and Other (See Comments)    PATIENT HAS HAD A PCN REACTION WITH IMMEDIATE RASH, FACIAL/TONGUE/THROAT SWELLING, SOB, OR LIGHTHEADEDNESS WITH HYPOTENSION:  #  #  #  YES  #  #  #   Has patient had a PCN reaction causing severe rash involving mucus membranes or skin necrosis: No Has patient had a PCN reaction that required hospitalization No Has patient had a PCN reaction occurring within the last 10 years: No    Current Outpatient Medications  Medication Sig Dispense Refill   ibuprofen (ADVIL) 600 MG tablet Take 1 tablet (600 mg total) by mouth every 6 (six) hours. 30 tablet 0   No current facility-administered medications for this visit.   No results found.  Review of Systems:   A ROS was performed including pertinent positives and negatives as documented in the HPI.  Physical Exam :   Constitutional: NAD and appears stated age  Neurological: Alert and oriented Psych: Appropriate affect and cooperative currently breastfeeding.   Comprehensive Musculoskeletal Exam:      Musculoskeletal Exam  Gait Normal  Alignment Normal   Right Left  Inspection Normal Normal  Palpation    Tenderness None Patellar tendon  Crepitus None None  Effusion None Trace  Range of Motion    Extension 0 0  Flexion 135 135  Strength    Extension 5/5 5/5  Flexion 5/5 5/5  Ligament Exam     Generalized Laxity No No  Lachman Negative Negative   Pivot Shift Negative Negative  Anterior Drawer Negative Negative  Valgus at 0 Negative Negative  Valgus at 20 Negative Negative  Varus at 0 0 0  Varus at 20   0 0  Posterior Drawer at 90 0 0  Vascular/Lymphatic Exam    Edema None None  Venous Stasis Changes No No  Distal Circulation Normal Normal  Neurologic    Light Touch Sensation Intact Intact   Special Tests:      Imaging:   Xray (4 views left knee): Normal   I personally reviewed and interpreted the radiographs.   Assessment:   40 year old female with left knee pain after feeling a pop while scooting up her chair.  Exam feels solid from a ligamentous standpoint today.  At this time I would like her to participate in physical therapy for range of motion and strengthening about the knee.  I will see her back in 4 weeks.  If she is not having time we will consider an MRI  Plan :    -Return to clinic in 4 weeks     I personally saw and evaluated the patient, and participated in the management and treatment plan.  Vanetta Mulders, MD Attending Physician, Orthopedic Surgery  This document was dictated using Dragon voice recognition software. A reasonable attempt at proof reading has been made to minimize errors.

## 2021-02-09 ENCOUNTER — Other Ambulatory Visit: Payer: Self-pay

## 2021-02-09 ENCOUNTER — Ambulatory Visit (HOSPITAL_BASED_OUTPATIENT_CLINIC_OR_DEPARTMENT_OTHER): Payer: 59 | Attending: Orthopaedic Surgery | Admitting: Physical Therapy

## 2021-02-09 DIAGNOSIS — M25562 Pain in left knee: Secondary | ICD-10-CM | POA: Diagnosis not present

## 2021-02-09 DIAGNOSIS — M25662 Stiffness of left knee, not elsewhere classified: Secondary | ICD-10-CM | POA: Diagnosis present

## 2021-02-09 DIAGNOSIS — R2689 Other abnormalities of gait and mobility: Secondary | ICD-10-CM | POA: Insufficient documentation

## 2021-02-09 NOTE — Therapy (Addendum)
OUTPATIENT PHYSICAL THERAPY LOWER EXTREMITY EVALUATION/discharge    Patient Name: Jennifer Dunlap MRN: [id number] DOB:09-21-1980, 40 y.o., female Today's Date: 02/10/2021   PT End of Session - 02/09/21 1301     Visit Number 1    Number of Visits 12    Date for PT Re-Evaluation 03/23/21    PT Start Time 1200   Patient 15 min late   PT Stop Time 1230    PT Time Calculation (min) 30 min    Activity Tolerance Patient tolerated treatment well    Behavior During Therapy [facility] for tasks assessed/performed             Past Medical History:  Diagnosis Date   Benign tumor of thyroid gland    Fibroid    Pericarditis 2011   Past Surgical History:  Procedure Laterality Date   LAPAROSCOPIC TUBAL LIGATION Bilateral 10/12/2014   Procedure: LAPAROSCOPIC TUBAL LIGATION;  Surgeon: Woodroe Mode, MD;  Location: [facility];  Service: Gynecology;  Laterality: Bilateral;   THYROIDECTOMY Right 11/14/2016   Procedure: RIGHT THYROIDECTOMY;  Surgeon: Jerrell Belfast, MD;  Location: [place];  Service: ENT;  Laterality: Right;   Patient Active Problem List   Diagnosis Date Noted   Thyroid mass 11/14/2016   Unwanted fertility 08/27/2016   FH: ovarian cancer in first degree relative 08/16/2016   History of bilateral tubal ligation 02/16/2016   History of maternal pericarditis 02/20/2012    PCP: Medicine, [facility] (Inactive)  REFERRING PROVIDER: Vanetta Mulders, MD  REFERRING DIAG: [phone] (ICD-10-CM) - Acute pain of left knee  THERAPY DIAG:  Acute pain of left knee  Other abnormalities of gait and mobility  Stiffness of left knee, not elsewhere classified  ONSET DATE: September 6th   SUBJECTIVE:   SUBJECTIVE STATEMENT: Patient was scooting a chair when she felt a pop on September 6th. She had significant swelling in the knee and difficulty bending the knee. She was put in a brace and on crutches. She has been off the crutches for about 2 weeks.  She is on her feet feet at  her job. 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 At the end of her day she gets a little sore.   PERTINENT HISTORY: Pericaridits 2011    PAIN:  Are you having pain? Yes VAS scale: 3/10 at worse  Pain location: Pain in the knee cap  Pain orientation: Left  PAIN TYPE: aching Pain description: intermittent  Aggravating factors: standing walking, end of the day  Relieving factors: rest   PRECAUTIONS: None  WEIGHT BEARING RESTRICTIONS No  FALLS:  Has patient fallen in last 6 months? 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     OBJECTIVE:   DIAGNOSTIC FINDINGS: X-ray: negative   PATIENT SURVEYS:  FOTO: 49% ability 79 % expected in 12 visits   COGNITION:  Overall cognitive status: Within functional limits for tasks assessed     SENSATION:  Light touch: Appears intact  Stereognosis: Appears intact  Hot/Cold: Appears intact  Proprioception: Appears intact  POSTURE:  Good  PALPATION: Palpable pop with return from full flexion. With increased speed of return  increased pop    LE AROM/PROM:  A/PROM Right 02/10/2021 Left 02/10/2021  Hip flexion    Hip extension    Hip abduction    Hip adduction    Hip internal rotation    Hip external rotation    Knee flexion    Knee extension  Audible pop with return from flexion with minor pain; keeps her knee in 5 degrees of flexion. Can be pushed to full extension  Ankle dorsiflexion    Ankle plantarflexion    Ankle inversion    Ankle eversion     (Blank rows = not tested)  LE MMT:  MMT Right 02/10/2021 Left 02/10/2021  Hip flexion 36.6 17.0  Hip extension    Hip abduction 34.4 19.5  Hip  adduction    Hip internal rotation    Hip external rotation    Knee flexion    Knee extension 28.9 Not measured   Ankle dorsiflexion    Ankle plantarflexion    Ankle inversion    Ankle eversion     (Blank rows = not tested)   GAIT: Hesitance to put weight on left LE; lateral shift to the right off the left leg. Lacking terminal knee extension on the left;    TODAY'S TREATMENT:  Exercises Supine Quad Set  10 reps Supine Active Straight Leg Raise - 2 x daily  10 reps Seated Hamstring Stretch 10 reps Heel Raises with Counter Support 3 sets 10 reps Standing March with Counter Support 3 sets 10 reps    PATIENT EDUCATION:  Education details: reviewed importance of getting full knee extension back comfortably;  Person educated: Patient Education method: Explanation, Demonstration, Tactile cues, Verbal cues, and Handouts Education comprehension: verbalized understanding, returned demonstration, verbal cues required, tactile cues required, and needs further education   HOME EXERCISE PROGRAM: Access Code: [email or web address] URL: [email or web address] Date: 02/10/2021 Prepared by: Carolyne Littles  Exercises Supine Quad Set - 2 x daily - 7 x weekly - 3 sets - 10 reps Supine Active Straight Leg Raise - 2 x daily - 7 x weekly - 3 sets - 10 reps Seated Hamstring Stretch - 1 x daily - 7 x weekly - 3 sets - 10 reps Heel Raises with Counter Support - 1 x daily - 7 x weekly - 3 sets - 10 reps Standing March with Counter Support - 1 x daily - 7 x weekly - 3 sets - 10 reps   ASSESSMENT:  CLINICAL IMPRESSION: Patient is a 40 y.o. female S/P knee injury on 10/18/2020. She has an [place] pop with returen from flexion to extension. She is hesitant to weight bear on the leg. She keeps her leg in a slightly flexed position and lacks termial knee extension with gait. She was given basic weight bearing exercises to work on at home as well as quad activation and knee extension stretching. She  would benefit from skilled therapy to return to active lifestyle and her job that requires her to stand and bend. She will return to her classes on January 9th.    Objective impairments include Abnormal gait, decreased activity tolerance, decreased endurance, difficulty walking, decreased ROM, decreased strength, and pain. These impairments are limiting patient from cleaning, driving, meal prep, occupation, laundry, yard work, shopping, and school. Personal factors including No personal factors are affecting patient's functional outcome. Patient will benefit from skilled PT to address above impairments and improve overall function.  REHAB POTENTIAL: Excellent  CLINICAL DECISION MAKING: Stable/uncomplicated  EVALUATION COMPLEXITY: Low   GOALS: Goals  reviewed with patient? Yes  SHORT TERM GOALS:  STG Name Target Date Goal status  1 Patient will come to full extension without pain  Baseline:  03/03/2021 INITIAL  2 Patient will demonstrate equal strength on the left and right LE  Baseline:  03/03/2021 INITIAL  3 Patient will have a 50% reduction in audible pop ( happens every time she comes back from flexion at a normal pace)  Baseline: 03/03/2021 INITIAL  LONG TERM GOALS:   LTG Name Target Date Goal status  1 Patient will return to biking with her kids without pain Baseline: 03/24/2021 INITIAL  2 Patient will perform a full squat without pain in order to perform pedicures  Baseline: 03/24/2021 INITIAL  3 Patient will demonstrate a 79% ability on FOTO in order to demonstrate improved self perceived function   Baseline: 03/24/2021 INITIAL  PLAN: PT FREQUENCY: 2x/week  PT DURATION: 6 weeks  PLANNED INTERVENTIONS: Therapeutic exercises, Therapeutic activity, Neuro Muscular re-education, Balance training, Gait training, Patient/Family education, Joint mobilization, Stair training, Aquatic Therapy, Dry Needling, Electrical stimulation, Cryotherapy, Moist heat, Taping, and Manual therapy  PLAN  FOR NEXT SESSION:  Manual therapy to left knee to improve extension; Add TKE; side lying hip abduction;  progress to higher level closed chain activity as tolerated such as stairs and squats; modalities PRN; Trial knee cap stability taping to see if it changes the popping  PHYSICAL THERAPY DISCHARGE SUMMARY  Visits from Start of Care: 1  Current functional level related to goals / functional outcomes: Did not return for follow up   Remaining deficits: Unknown    Education / Equipment: HEP    Patient agrees to discharge. Patient goals were not met. Patient is being discharged due to not returning since the last visit.   Carney Living PT DPT  02/10/2021, 2:11 PM

## 2021-02-10 ENCOUNTER — Encounter (HOSPITAL_BASED_OUTPATIENT_CLINIC_OR_DEPARTMENT_OTHER): Payer: Self-pay | Admitting: Physical Therapy

## 2021-02-21 ENCOUNTER — Ambulatory Visit (HOSPITAL_BASED_OUTPATIENT_CLINIC_OR_DEPARTMENT_OTHER): Payer: 59 | Attending: Orthopaedic Surgery | Admitting: Physical Therapy

## 2021-02-21 DIAGNOSIS — M25662 Stiffness of left knee, not elsewhere classified: Secondary | ICD-10-CM | POA: Insufficient documentation

## 2021-02-21 DIAGNOSIS — M25562 Pain in left knee: Secondary | ICD-10-CM | POA: Insufficient documentation

## 2021-02-21 DIAGNOSIS — R2689 Other abnormalities of gait and mobility: Secondary | ICD-10-CM | POA: Insufficient documentation

## 2021-02-21 NOTE — Therapy (Incomplete)
°  OUTPATIENT PHYSICAL THERAPY TREATMENT NOTE   Patient Name: Jennifer Dunlap MRN: [id number] DOB:1981/01/10, 41 y.o., female Today's Date: 02/21/2021  PCP: [facility], [facility] (Inactive) REFERRING PROVIDER: Vanetta Mulders, MD    Past Medical History:  Diagnosis Date   Benign tumor of thyroid gland    Fibroid    Pericarditis 2011   Past Surgical History:  Procedure Laterality Date   LAPAROSCOPIC TUBAL LIGATION Bilateral 10/12/2014   Procedure: LAPAROSCOPIC TUBAL LIGATION;  Surgeon: Woodroe Mode, MD;  Location: [facility];  Service: Gynecology;  Laterality: Bilateral;   THYROIDECTOMY Right 11/14/2016   Procedure: RIGHT THYROIDECTOMY;  Surgeon: Jerrell Belfast, MD;  Location: [place];  Service: ENT;  Laterality: Right;   Patient Active Problem List   Diagnosis Date Noted   Thyroid mass 11/14/2016   Unwanted fertility 08/27/2016   FH: ovarian cancer in first degree relative 08/16/2016   History of bilateral tubal ligation 02/16/2016   History of maternal pericarditis 02/20/2012    REFERRING DIAG: ***  THERAPY DIAG:  No diagnosis found.  PERTINENT HISTORY: ***  PRECAUTIONS: ***  SUBJECTIVE: ***  PAIN:  Are you having pain? {yes/no:20286} VAS scale: ***/10 Pain location: *** Pain orientation: {Pain Orientation:25161}  PAIN TYPE: {type:313116} Pain description: {PAIN DESCRIPTION:21022940}  Aggravating factors: *** Relieving factors: ***     (Copy Objective to Plan section here)   Sherol Dade 02/21/2021, 10:16 AM

## 2021-02-28 ENCOUNTER — Ambulatory Visit (HOSPITAL_BASED_OUTPATIENT_CLINIC_OR_DEPARTMENT_OTHER): Payer: 59 | Admitting: Physical Therapy

## 2021-02-28 NOTE — Therapy (Incomplete)
OUTPATIENT PHYSICAL THERAPY TREATMENT NOTE   Patient Name: Jennifer Dunlap MRN: [id number] DOB:[birth year], 41 y.o., female Today's Date: 02/28/2021  PCP: [facility], [facility] (Inactive) REFERRING PROVIDER: Vanetta Mulders, MD    Past Medical History:  Diagnosis Date   Benign tumor of thyroid gland    Fibroid    Pericarditis 2011   Past Surgical History:  Procedure Laterality Date   LAPAROSCOPIC TUBAL LIGATION Bilateral 10/12/2014   Procedure: LAPAROSCOPIC TUBAL LIGATION;  Surgeon: Woodroe Mode, MD;  Location: [facility];  Service: Gynecology;  Laterality: Bilateral;   THYROIDECTOMY Right 11/14/2016   Procedure: RIGHT THYROIDECTOMY;  Surgeon: Jerrell Belfast, MD;  Location: [place];  Service: ENT;  Laterality: Right;   Patient Active Problem List   Diagnosis Date Noted   Thyroid mass 11/14/2016   Unwanted fertility 08/27/2016   FH: ovarian cancer in first degree relative 08/16/2016   History of bilateral tubal ligation 02/16/2016   History of maternal pericarditis 02/20/2012    PCP: [facility] (Inactive)   REFERRING PROVIDER: Vanetta Mulders, MD   REFERRING DIAG: [phone] (ICD-10-CM) - Acute pain of left knee   THERAPY DIAG:  Acute pain of left knee   Other abnormalities of gait and mobility   Stiffness of left knee, not elsewhere classified   ONSET DATE: September 6th    SUBJECTIVE:    SUBJECTIVE STATEMENT: Patient was scooting a chair when she felt a pop on September 6th. She had significant swelling in the knee and difficulty bending the knee. She was put in a brace and on crutches. She has been off the crutches for about 2 weeks.  She is on her feet feet at her job. 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 At the end of her day she gets a little sore.    PERTINENT HISTORY: Pericaridits 2011      PAIN:  Are you having pain? Yes VAS scale: 3/10 at worse  Pain location: Pain in the  knee cap  Pain orientation: Left  PAIN TYPE: aching Pain description: intermittent  Aggravating factors: standing walking, end of the day  Relieving factors: rest    PRECAUTIONS: None   WEIGHT BEARING RESTRICTIONS No   FALLS:  Has patient fallen in last 6 months? 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       OBJECTIVE:    DIAGNOSTIC FINDINGS: X-ray: negative    PATIENT SURVEYS:  FOTO: 49% ability 79 % expected in 12 visits    COGNITION:          Overall cognitive status: Within functional limits for tasks assessed                        SENSATION:          Light touch: Appears intact          Stereognosis: Appears intact          Hot/Cold: Appears intact          Proprioception: Appears intact   POSTURE:  Good   PALPATION: Palpable pop  with return from full flexion. With increased speed of return increased pop      LE AROM/PROM:   A/PROM Right 02/10/2021 Left 02/10/2021  Hip flexion      Hip extension      Hip abduction      Hip adduction      Hip internal rotation      Hip external rotation      Knee flexion      Knee extension   Audible pop with return from flexion with minor pain; keeps her knee in 5 degrees of flexion. Can be pushed to full extension  Ankle dorsiflexion      Ankle plantarflexion      Ankle inversion      Ankle eversion       (Blank rows = not tested)   LE MMT:   MMT Right 02/10/2021 Left 02/10/2021  Hip flexion 36.6 17.0  Hip extension      Hip abduction 34.4 19.5  Hip adduction      Hip internal rotation      Hip external rotation      Knee flexion      Knee extension 28.9 Not measured   Ankle dorsiflexion      Ankle  plantarflexion      Ankle inversion      Ankle eversion       (Blank rows = not tested)     GAIT: Hesitance to put weight on left LE; lateral shift to the right off the left leg. Lacking terminal knee extension on the left;      TODAY'S TREATMENT:   Exercises Supine Quad Set  10 reps Supine Active Straight Leg Raise - 2 x daily  10 reps Seated Hamstring Stretch 10 reps Heel Raises with Counter Support 3 sets 10 reps Standing March with Counter Support 3 sets 10 reps       PATIENT EDUCATION:  Education details: reviewed importance of getting full knee extension back comfortably;  Person educated: Patient Education method: Explanation, Demonstration, Tactile cues, Verbal cues, and Handouts Education comprehension: verbalized understanding, returned demonstration, verbal cues required, tactile cues required, and needs further education     HOME EXERCISE PROGRAM: Access Code: [email or web address] URL: [email or web address] Date: 02/10/2021 Prepared by: Carolyne Littles   Exercises Supine Quad Set - 2 x daily - 7 x weekly - 3 sets - 10 reps Supine Active Straight Leg Raise - 2 x daily - 7 x weekly - 3 sets - 10 reps Seated Hamstring Stretch - 1 x daily - 7 x weekly - 3 sets - 10 reps Heel Raises with Counter Support - 1 x daily - 7 x weekly - 3 sets - 10 reps Standing March with Counter Support - 1 x daily - 7 x weekly - 3 sets - 10 reps     ASSESSMENT:   CLINICAL IMPRESSION: Patient is a 41 y.o. female S/P knee injury on 10/18/2020. She has an [place] pop with returen from flexion to extension. She is hesitant to weight bear on the leg. She keeps her leg in a slightly flexed position and lacks termial knee extension with gait. She was given basic weight bearing exercises to work on at home as well as quad activation and knee extension stretching. She would benefit from skilled therapy to return to active lifestyle and her job that requires her to stand and bend. She will return  to her classes on January 9th.     Objective impairments include Abnormal gait, decreased  activity tolerance, decreased endurance, difficulty walking, decreased ROM, decreased strength, and pain. These impairments are limiting patient from cleaning, driving, meal prep, occupation, laundry, yard work, shopping, and school. Personal factors including No personal factors are affecting patient's functional outcome. Patient will benefit from skilled PT to address above impairments and improve overall function.   REHAB POTENTIAL: Excellent   CLINICAL DECISION MAKING: Stable/uncomplicated   EVALUATION COMPLEXITY: Low     GOALS: Goals reviewed with patient? Yes   SHORT TERM GOALS:   STG Name Target Date Goal status  1 Patient will come to full extension without pain  Baseline:  03/03/2021 INITIAL  2 Patient will demonstrate equal strength on the left and right LE  Baseline:  03/03/2021 INITIAL  3 Patient will have a 50% reduction in audible pop ( happens every time she comes back from flexion at a normal pace)  Baseline: 03/03/2021 INITIAL  LONG TERM GOALS:    LTG Name Target Date Goal status  1 Patient will return to biking with her kids without pain Baseline: 03/24/2021 INITIAL  2 Patient will perform a full squat without pain in order to perform pedicures  Baseline: 03/24/2021 INITIAL  3 Patient will demonstrate a 79% ability on FOTO in order to demonstrate improved self perceived function   Baseline: 03/24/2021 INITIAL  PLAN: PT FREQUENCY: 2x/week   PT DURATION: 6 weeks   PLANNED INTERVENTIONS: Therapeutic exercises, Therapeutic activity, Neuro Muscular re-education, Balance training, Gait training, Patient/Family education, Joint mobilization, Stair training, Aquatic Therapy, Dry Needling, Electrical stimulation, Cryotherapy, Moist heat, Taping, and Manual therapy   PLAN FOR NEXT SESSION:  Manual therapy to left knee to improve extension; Add TKE; side lying hip abduction;  progress to  higher level closed chain activity as tolerated such as stairs and squats; modalities PRN; Trial knee cap stability taping to see if it changes the popping    Sherol Dade 02/28/2021, 10:25 AM

## 2021-03-07 ENCOUNTER — Encounter (HOSPITAL_BASED_OUTPATIENT_CLINIC_OR_DEPARTMENT_OTHER): Payer: 59 | Admitting: Physical Therapy

## 2021-03-09 ENCOUNTER — Ambulatory Visit (HOSPITAL_BASED_OUTPATIENT_CLINIC_OR_DEPARTMENT_OTHER): Payer: 59 | Admitting: Orthopaedic Surgery

## 2021-03-14 ENCOUNTER — Encounter (HOSPITAL_BASED_OUTPATIENT_CLINIC_OR_DEPARTMENT_OTHER): Payer: 59 | Admitting: Physical Therapy

## 2021-03-23 ENCOUNTER — Ambulatory Visit (INDEPENDENT_AMBULATORY_CARE_PROVIDER_SITE_OTHER): Payer: 59 | Admitting: Orthopaedic Surgery

## 2021-03-23 ENCOUNTER — Other Ambulatory Visit: Payer: Self-pay

## 2021-03-23 DIAGNOSIS — M222X2 Patellofemoral disorders, left knee: Secondary | ICD-10-CM | POA: Diagnosis not present

## 2021-03-23 DIAGNOSIS — M25562 Pain in left knee: Secondary | ICD-10-CM

## 2021-03-23 NOTE — Progress Notes (Signed)
Chief Complaint: Left knee pain     History of Present Illness:    03/23/2021: Jennifer Dunlap presents today for follow-up of the left knee.  Overall she does feel dramatically better.  She has been working on a home strengthening program which is significantly helping her.  She only at this time has some subtle weakness with stairs  Jennifer Dunlap is a 41 y.o. 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  Since that time she was having swelling and difficulty placing weight on the leg.  She denies any pain with ambulation.  She has been using a hinged brace from the emergency room.  She is a Theatre manager    Surgical History:   None  PMH/PSH/Family History/Social History/Meds/Allergies:    Past Medical History:  Diagnosis Date   Benign tumor of thyroid gland    Fibroid    Pericarditis 2011   Past Surgical History:  Procedure Laterality Date   LAPAROSCOPIC TUBAL LIGATION Bilateral 10/12/2014   Procedure: LAPAROSCOPIC TUBAL LIGATION;  Surgeon: Woodroe Mode, MD;  Location: [facility];  Service: Gynecology;  Laterality: Bilateral;   THYROIDECTOMY Right 11/14/2016   Procedure: RIGHT THYROIDECTOMY;  Surgeon: Jerrell Belfast, MD;  Location: [place];  Service: ENT;  Laterality: Right;   Social History   Socioeconomic History   Marital status: Single    Spouse name: Not on file   Number of children: Not on file   Years of education: Not on file   Highest education level: Not on file  Occupational History   Not on file  Tobacco Use   Smoking status: Never   Smokeless tobacco: Never  Vaping Use   Vaping Use: Never used  Substance and Sexual Activity   Alcohol use: No    Comment: occcasional   Drug use: No   Sexual activity: Not Currently    Birth control/protection: Surgical  Other Topics Concern   Not on file  Social History Narrative   Not on file   Social Determinants of Health    Financial Resource Strain: Not on file  Food Insecurity: Not on file  Transportation Needs: Not on file  Physical Activity: Not on file  Stress: Not on file  Social Connections: Not on file   Family History  Problem Relation Age of Onset   Heart disease Father    Heart attack Father    Stroke Father    Cancer Sister 68       ovarian   Hypertension Mother    Heart attack Paternal Grandmother    Allergies  Allergen Reactions   Penicillins Anaphylaxis and Other (See Comments)    PATIENT HAS HAD A PCN REACTION WITH IMMEDIATE RASH, FACIAL/TONGUE/THROAT SWELLING, SOB, OR LIGHTHEADEDNESS WITH HYPOTENSION:  #  #  #  YES  #  #  #   Has patient had a PCN reaction causing severe rash involving mucus membranes or skin necrosis: No Has patient had a PCN reaction that required hospitalization No Has patient had a PCN reaction occurring within the last 10 years: No    Current Outpatient Medications  Medication Sig Dispense Refill   ibuprofen (ADVIL) 600 MG tablet Take 1 tablet (600 mg total) by mouth every 6 (six) hours. 30 tablet 0  No current facility-administered medications for this visit.   No results found.  Review of Systems:   A ROS was performed including pertinent positives and negatives as documented in the HPI.  Physical Exam :   Constitutional: NAD and appears stated age Neurological: Alert and oriented Psych: Appropriate affect and cooperative currently breastfeeding.   Comprehensive Musculoskeletal Exam:      Musculoskeletal Exam  Gait Normal  Alignment Normal   Right Left  Inspection Normal Normal  Palpation    Tenderness None Patellar tendon  Crepitus None None  Effusion None Trace  Range of Motion    Extension 0 0  Flexion 135 135  Strength    Extension 5/5 5/5  Flexion 5/5 5/5  Ligament Exam     Generalized Laxity No No  Lachman Negative Negative   Pivot Shift Negative Negative  Anterior Drawer Negative Negative  Valgus at 0 Negative Negative   Valgus at 20 Negative Negative  Varus at 0 0 0  Varus at 20   0 0  Posterior Drawer at 90 0 0  Vascular/Lymphatic Exam    Edema None None  Venous Stasis Changes No No  Distal Circulation Normal Normal  Neurologic    Light Touch Sensation Intact Intact  Special Tests:      Imaging:   Xray (4 views left knee): Normal   I personally reviewed and interpreted the radiographs.   Assessment:   41 year old female with left knee pain after feeling a pop while scooting up her chair.  At this time her pain is consistent with ongoing patellofemoral type pain.  This is improving with a home exercise program I would like her to continue this.  I have anticipated that she will feel better with an additional 4 weeks of strengthening.  She will see her back on an as-needed basis Plan :    -Return to clinic as needed     I personally saw and evaluated the patient, and participated in the management and treatment plan.  Vanetta Mulders, MD Attending Physician, Orthopedic Surgery  This document was dictated using Dragon voice recognition software. A reasonable attempt at proof reading has been made to minimize errors.

## 2023-07-06 ENCOUNTER — Inpatient Hospital Stay (HOSPITAL_COMMUNITY)

## 2023-07-06 ENCOUNTER — Other Ambulatory Visit: Payer: Self-pay

## 2023-07-06 ENCOUNTER — Encounter (HOSPITAL_COMMUNITY): Payer: Self-pay | Admitting: Obstetrics and Gynecology

## 2023-07-06 ENCOUNTER — Inpatient Hospital Stay (HOSPITAL_COMMUNITY)
Admission: AD | Admit: 2023-07-06 | Discharge: 2023-07-06 | Disposition: A | Discharge status: Home / Self Care | Attending: Obstetrics and Gynecology | Admitting: Obstetrics and Gynecology

## 2023-07-06 DIAGNOSIS — D251 Intramural leiomyoma of uterus: Secondary | ICD-10-CM | POA: Insufficient documentation

## 2023-07-06 DIAGNOSIS — O0941 Supervision of pregnancy with grand multiparity, first trimester: Secondary | ICD-10-CM | POA: Diagnosis not present

## 2023-07-06 DIAGNOSIS — O209 Hemorrhage in early pregnancy, unspecified: Secondary | ICD-10-CM | POA: Diagnosis present

## 2023-07-06 DIAGNOSIS — O3411 Maternal care for benign tumor of corpus uteri, first trimester: Secondary | ICD-10-CM | POA: Diagnosis not present

## 2023-07-06 DIAGNOSIS — D219 Benign neoplasm of connective and other soft tissue, unspecified: Secondary | ICD-10-CM

## 2023-07-06 DIAGNOSIS — O09521 Supervision of elderly multigravida, first trimester: Secondary | ICD-10-CM | POA: Insufficient documentation

## 2023-07-06 DIAGNOSIS — Z3A01 Less than 8 weeks gestation of pregnancy: Secondary | ICD-10-CM

## 2023-07-06 DIAGNOSIS — Z3491 Encounter for supervision of normal pregnancy, unspecified, first trimester: Secondary | ICD-10-CM

## 2023-07-06 LAB — CBC
HCT: 38.2 % (ref 36.0–46.0)
Hemoglobin: 13 g/dL (ref 12.0–15.0)
MCH: 29 pg (ref 26.0–34.0)
MCHC: 34 g/dL (ref 30.0–36.0)
MCV: 85.1 fL (ref 80.0–100.0)
Platelets: 205 10*3/uL (ref 150–400)
RBC: 4.49 MIL/uL (ref 3.87–5.11)
RDW: 13.3 % (ref 11.5–15.5)
WBC: 6.3 10*3/uL (ref 4.0–10.5)
nRBC: 0 % (ref 0.0–0.2)

## 2023-07-06 LAB — WET PREP, GENITAL
Clue Cells Wet Prep HPF POC: NONE SEEN
Sperm: NONE SEEN
Trich, Wet Prep: NONE SEEN
WBC, Wet Prep HPF POC: <10 (ref <10)
Yeast Wet Prep HPF POC: NONE SEEN

## 2023-07-06 LAB — HCG, QUANTITATIVE, PREGNANCY: hCG, Beta Chain, Quant, S: 3260 m[IU]/mL — ABNORMAL HIGH (ref <5)

## 2023-07-06 LAB — POCT PREGNANCY, URINE: Preg Test, Ur: POSITIVE — AB

## 2023-07-06 NOTE — MAU Note (Signed)
 Jennifer Dunlap is a 43 y.o. at [redacted]w[redacted]d here in MAU reporting: dark red vaginal bleeding with a small clot that started yesterday. Patient denies having any pain.   LMP: 05/28/2023 (approximate) Onset of complaint: yesterday Pain score: 0 Vitals:   07/06/23 1840  BP: 117/65  Pulse: 60  Resp: 16  Temp: 98 F (36.7 C)  SpO2: 100%      Lab orders placed from triage: urine pregnancy test

## 2023-07-06 NOTE — MAU Provider Note (Cosign Needed Addendum)
 History     CSN: [id number]  Arrival date and time: 07/06/23 1811    Chief Complaint  Patient presents with   Vaginal Bleeding    Vaginal Bleeding Pertinent negatives include no abdominal pain, chills, fever or nausea.    Jennifer Dunlap is a 43 y.o. [id number] at [redacted]w[redacted]d who presents for evaluation of vaginal bleeding. Patient reports light bleeding that started yesterday. Denies having pain.    She denies any discharge and leaking of fluid. Denies any constipation, diarrhea or any urinary complaints. Reports normal fetal movement.   OB History     Gravida  11   Para  8   Term  8   Preterm  0   AB  2   Living  8      SAB  0   IAB  2   Ectopic  0   Multiple  0   Live Births  8           Past Medical History:  Diagnosis Date   Benign tumor of thyroid  gland    Fibroid    Pericarditis 2011    Past Surgical History:  Procedure Laterality Date   LAPAROSCOPIC TUBAL LIGATION Bilateral 10/12/2014   Procedure: LAPAROSCOPIC TUBAL LIGATION;  Surgeon: Tresia Fruit, MD;  Location: [facility];  Service: Gynecology;  Laterality: Bilateral;   THYROIDECTOMY Right 11/14/2016   Procedure: RIGHT THYROIDECTOMY;  Surgeon: Ammon Bales, MD;  Location: [facility];  Service: ENT;  Laterality: Right;    Family History  Problem Relation Age of Onset   Heart disease Father    Heart attack Father    Stroke Father    Cancer Sister 52       ovarian   Hypertension Mother    Heart attack Paternal Grandmother     Social History   Tobacco Use   Smoking status: Never   Smokeless tobacco: Never  Vaping Use   Vaping status: Never Used  Substance Use Topics   Alcohol use: No    Comment: occcasional   Drug use: No    Allergies:  Allergies  Allergen Reactions   Penicillins Anaphylaxis and Other (See Comments)    PATIENT HAS HAD A PCN REACTION WITH IMMEDIATE RASH, FACIAL/TONGUE/THROAT SWELLING, SOB, OR LIGHTHEADEDNESS WITH HYPOTENSION:  #  #  #  YES  #  #  #   Has patient  had a PCN reaction causing severe rash involving mucus membranes or skin necrosis: No Has patient had a PCN reaction that required hospitalization No Has patient had a PCN reaction occurring within the last 10 years: No     Medications Prior to Admission  Medication Sig Dispense Refill Last Dose/Taking   ibuprofen  (ADVIL ) 600 MG tablet Take 1 tablet (600 mg total) by mouth every 6 (six) hours. 30 tablet 0     Review of Systems  Constitutional:  Negative for chills and fever.  Gastrointestinal:  Negative for abdominal pain, diarrhea and nausea.  Genitourinary:  Positive for vaginal bleeding. Negative for dysuria.   Physical Exam   Blood pressure 117/65, pulse 60, temperature 98 F (36.7 C), temperature source Oral, resp. rate 16, height 5' 3.75" (1.619 m), weight 83 kg, last menstrual period 05/28/2023, SpO2 100%, currently breastfeeding.  Patient Vitals for the past 24 hrs:  BP Temp Temp src Pulse Resp SpO2 Height Weight  07/06/23 1846 -- -- -- -- -- -- 5' 3.75" (1.619 m) 83 kg  07/06/23 1840 117/65 98 F (36.7 C) Oral 60 16  100 % -- --    Physical Exam Vitals and nursing note reviewed.  Constitutional:      Appearance: Normal appearance.  Cardiovascular:     Rate and Rhythm: Normal rate and regular rhythm.  Pulmonary:     Effort: Pulmonary effort is normal.  Neurological:     Mental Status: She is alert.  Psychiatric:        Mood and Affect: Mood normal.        Behavior: Behavior normal.       MAU Course  Procedures  Results for orders placed or performed during the hospital encounter of 07/06/23 (from the past 24 hours)  Pregnancy, urine POC     Status: Abnormal   Collection Time: 07/06/23  6:23 PM  Result Value Ref Range   Preg Test, Ur POSITIVE (A) NEGATIVE  CBC     Status: None   Collection Time: 07/06/23  6:46 PM  Result Value Ref Range   WBC 6.3 4.0 - 10.5 K/uL   RBC 4.49 3.87 - 5.11 MIL/uL   Hemoglobin 13.0 12.0 - 15.0 g/dL   HCT 29.5 62.1 - 30.8 %    MCV 85.1 80.0 - 100.0 fL   MCH 29.0 26.0 - 34.0 pg   MCHC 34.0 30.0 - 36.0 g/dL   RDW 65.7 84.6 - 96.2 %   Platelets 205 150 - 400 K/uL   nRBC 0.0 0.0 - 0.2 %  hCG, quantitative, pregnancy     Status: Abnormal   Collection Time: 07/06/23  6:46 PM  Result Value Ref Range   hCG, Beta Chain, Quant, S 3,260 (H) <5 mIU/mL  Wet prep, genital     Status: None   Collection Time: 07/06/23  7:00 PM  Result Value Ref Range   Yeast Wet Prep HPF POC NONE SEEN NONE SEEN   Trich, Wet Prep NONE SEEN NONE SEEN   Clue Cells Wet Prep HPF POC NONE SEEN NONE SEEN   WBC, Wet Prep HPF POC <10 <10   Sperm NONE SEEN      US  OB LESS THAN 14 WEEKS WITH OB TRANSVAGINAL Result Date: 07/06/2023 CLINICAL DATA:  Vaginal bleeding affecting early pregnancy, beta HCG 3,260 EXAM: OBSTETRIC <14 WK US  AND TRANSVAGINAL OB US  TECHNIQUE: Both transabdominal and transvaginal ultrasound examinations were performed for complete evaluation of the gestation as well as the maternal uterus, adnexal regions, and pelvic cul-de-sac. Transvaginal technique was performed to assess early pregnancy. COMPARISON:  None Available. FINDINGS: Intrauterine gestational sac: Single Yolk sac:  Visualized. Embryo:  Not Visualized. Cardiac Activity: Not Visualized. MSD: 6.2 mm   5 w   2 d Subchorionic hemorrhage:  None visualized. Maternal uterus/adnexae: Multiple intramural uterine fibroids are identified, largest measuring 3.6 cm. Right ovary measures 3.6 x 1.9 x 1.8 cm and the left ovary measures 3.7 x 2.8 x 2.1 cm. Multiple normal follicles are identified. No pelvic free fluid. IMPRESSION: 1. Probable early intrauterine gestational sac and yolk sac, but no fetal pole or cardiac activity yet visualized. Recommend follow-up quantitative B-HCG levels and follow-up US  in 14 days to assess viability. This recommendation follows SRU consensus guidelines: Diagnostic Criteria for Nonviable Pregnancy Early in the First Trimester. Mel Spine Med 2013; 952:8413-24.  Electronically Signed   By: Bobbye Burrow M.D.   On: 07/06/2023 20:31     MDM  Labs ordered and reviewed.  UA, UPT CBC, HCG ABO Wet prep, GC/CH U/S Transvag  Discussed early IUP, fibroids present. Discussed need for repeat ultrasound in  10 days and patient states she will follow up with her OB in [place]  Assessment and Plan   1. Normal intrauterine pregnancy on prenatal ultrasound in first trimester   2. [redacted] weeks gestation of pregnancy   3. Vaginal bleeding in pregnancy, first trimester   4. Fibroids     -Discharge home in stable condition -First Trimester precautions discussed -Patient advised to follow-up with OB provider in [place] -Patient may return to MAU as needed or if her condition were to change or worsen  Doria Garden, SWHNP 07/06/2023, 9:03 PM    Attestation of Supervision of Student:  I confirm that I have verified the information documented in the nurse practitioner student's note and that I have also personally reperformed the history, physical exam and all medical decision making activities.  I have verified that all services and findings are accurately documented in this student's note; and I agree with management and plan as outlined in the documentation. I have also made any necessary editorial changes.  Agatha Alcon, CNM [facility] [employer], [facility] 07/06/2023 9:08 PM

## 2023-07-09 LAB — GC/CHLAMYDIA PROBE AMP (~~LOC~~) NOT AT ARMC
Chlamydia: NEGATIVE
Comment: NEGATIVE
Comment: NORMAL
Neisseria Gonorrhea: NEGATIVE
